# Patient Record
Sex: Male | Born: 1935 | Race: White | Hispanic: No | Smoking: Never smoker
Health system: Southern US, Community
[De-identification: ages and names within clinical notes are randomized; demographics above are authoritative.]

## PROBLEM LIST (undated history)

## (undated) DIAGNOSIS — I5032 Chronic diastolic (congestive) heart failure: Secondary | ICD-10-CM

## (undated) DIAGNOSIS — I482 Chronic atrial fibrillation, unspecified: Secondary | ICD-10-CM

## (undated) DIAGNOSIS — Z96659 Presence of unspecified artificial knee joint: Secondary | ICD-10-CM

## (undated) DIAGNOSIS — R0600 Dyspnea, unspecified: Secondary | ICD-10-CM

## (undated) DIAGNOSIS — I340 Nonrheumatic mitral (valve) insufficiency: Secondary | ICD-10-CM

## (undated) DIAGNOSIS — T8459XS Infection and inflammatory reaction due to other internal joint prosthesis, sequela: Secondary | ICD-10-CM

## (undated) DIAGNOSIS — I480 Paroxysmal atrial fibrillation: Secondary | ICD-10-CM

## (undated) DIAGNOSIS — I350 Nonrheumatic aortic (valve) stenosis: Secondary | ICD-10-CM

## (undated) HISTORY — PX: KNEE SURGERY: SHX244

## (undated) HISTORY — PX: VASCULAR SURGERY: SHX849

---

## 2009-10-05 DIAGNOSIS — E785 Hyperlipidemia, unspecified: Secondary | ICD-10-CM | POA: Insufficient documentation

## 2009-10-05 DIAGNOSIS — M1990 Unspecified osteoarthritis, unspecified site: Secondary | ICD-10-CM | POA: Insufficient documentation

## 2009-10-06 DIAGNOSIS — E039 Hypothyroidism, unspecified: Secondary | ICD-10-CM | POA: Insufficient documentation

## 2010-03-08 DIAGNOSIS — F528 Other sexual dysfunction not due to a substance or known physiological condition: Secondary | ICD-10-CM | POA: Insufficient documentation

## 2011-09-18 DIAGNOSIS — Z1211 Encounter for screening for malignant neoplasm of colon: Secondary | ICD-10-CM | POA: Insufficient documentation

## 2011-09-18 DIAGNOSIS — K644 Residual hemorrhoidal skin tags: Secondary | ICD-10-CM | POA: Insufficient documentation

## 2012-09-30 ENCOUNTER — Encounter (INDEPENDENT_AMBULATORY_CARE_PROVIDER_SITE_OTHER): Payer: Self-pay | Admitting: Ophthalmology

## 2012-10-11 ENCOUNTER — Encounter (INDEPENDENT_AMBULATORY_CARE_PROVIDER_SITE_OTHER): Payer: Medicare Other | Admitting: Ophthalmology

## 2012-10-11 DIAGNOSIS — H3530 Unspecified macular degeneration: Secondary | ICD-10-CM

## 2012-10-11 DIAGNOSIS — H35379 Puckering of macula, unspecified eye: Secondary | ICD-10-CM

## 2012-10-11 DIAGNOSIS — H43819 Vitreous degeneration, unspecified eye: Secondary | ICD-10-CM

## 2012-10-11 DIAGNOSIS — H2510 Age-related nuclear cataract, unspecified eye: Secondary | ICD-10-CM

## 2013-10-13 ENCOUNTER — Ambulatory Visit (INDEPENDENT_AMBULATORY_CARE_PROVIDER_SITE_OTHER): Payer: Medicare Other | Admitting: Ophthalmology

## 2014-01-18 DIAGNOSIS — E669 Obesity, unspecified: Secondary | ICD-10-CM | POA: Insufficient documentation

## 2016-02-04 ENCOUNTER — Emergency Department (HOSPITAL_BASED_OUTPATIENT_CLINIC_OR_DEPARTMENT_OTHER)
Admission: EM | Admit: 2016-02-04 | Discharge: 2016-02-04 | Disposition: A | Discharge status: Home / Self Care | Payer: Medicare Other | Attending: Emergency Medicine | Admitting: Emergency Medicine

## 2016-02-04 ENCOUNTER — Emergency Department (HOSPITAL_BASED_OUTPATIENT_CLINIC_OR_DEPARTMENT_OTHER): Payer: Medicare Other

## 2016-02-04 ENCOUNTER — Encounter (HOSPITAL_BASED_OUTPATIENT_CLINIC_OR_DEPARTMENT_OTHER): Payer: Self-pay | Admitting: *Deleted

## 2016-02-04 DIAGNOSIS — Y998 Other external cause status: Secondary | ICD-10-CM | POA: Insufficient documentation

## 2016-02-04 DIAGNOSIS — S42301A Unspecified fracture of shaft of humerus, right arm, initial encounter for closed fracture: Secondary | ICD-10-CM

## 2016-02-04 DIAGNOSIS — S40021A Contusion of right upper arm, initial encounter: Secondary | ICD-10-CM | POA: Insufficient documentation

## 2016-02-04 DIAGNOSIS — S42291A Other displaced fracture of upper end of right humerus, initial encounter for closed fracture: Secondary | ICD-10-CM | POA: Diagnosis not present

## 2016-02-04 DIAGNOSIS — Y9389 Activity, other specified: Secondary | ICD-10-CM | POA: Diagnosis not present

## 2016-02-04 DIAGNOSIS — Y9289 Other specified places as the place of occurrence of the external cause: Secondary | ICD-10-CM | POA: Insufficient documentation

## 2016-02-04 DIAGNOSIS — W101XXA Fall (on)(from) sidewalk curb, initial encounter: Secondary | ICD-10-CM | POA: Insufficient documentation

## 2016-02-04 DIAGNOSIS — S4991XA Unspecified injury of right shoulder and upper arm, initial encounter: Secondary | ICD-10-CM | POA: Diagnosis present

## 2016-02-04 MED ORDER — OXYCODONE-ACETAMINOPHEN 5-325 MG PO TABS: 1.0000 | ORAL_TABLET | ORAL | Status: DC | PRN

## 2016-02-04 NOTE — ED Notes (Signed)
He fell 8" off a curb yesterday and fell onto concrete. Injury to his right surgery. He had an xray that showed a fracture. He was sent home with a shoulder immobilizer and ortho referral. He wants a second opinion.

## 2016-02-04 NOTE — ED Provider Notes (Signed)
CSN: [id number]     Arrival date & time 02/04/16  1902 History  By signing my name below, I, Louis Koch, attest that this documentation has been prepared under the direction and in the presence of Marily MemosJason Violia Knopf, MD. Electronically Signed: Doreatha MartinEva Koch, ED Scribe. 02/04/2016. 8:27 PM.    Chief Complaint  Patient presents with  . Shoulder Injury   The history is provided by the patient. No language interpreter was used.   HPI Comments: Louis CaterJames Olmsted is a 82 y.o. male who presents to the Emergency Department complaining of moderate right shoulder pain and swelling s/p mechanical fall yesterday. Pt states that he stepped off an 8" curb and fell forward, landing on his right shoulder. Denies LOC or head injury. [facility] following his initial injury, had XR showing fracture and was discharged with a shoulder immobilizer and referral to ortho. Per friend, they were unable to schedule an appointment with ortho today d/t miscommunication between the office and the hospital. Denies numbness, additional injuries.   History reviewed. No pertinent past medical history. Past Surgical History  Procedure Laterality Date  . Knee surgery     No family history on file. Social History  Substance Use Topics  . Smoking status: Never Smoker   . Smokeless tobacco: None  . Alcohol Use: Yes    Review of Systems  Musculoskeletal: Positive for joint swelling and arthralgias.  Neurological: Negative for numbness.  All other systems reviewed and are negative.  Allergies  Review of patient's allergies indicates no known allergies.  Home Medications   Prior to Admission medications   Not on File   BP 132/81 mmHg  Pulse 77  Temp(Src) 98.2 F (36.8 C) (Oral)  Resp 18  Ht 6\' 5"  (1.956 m)  Wt 290 lb (131.543 kg)  BMI 34.38 kg/m2  SpO2 98% Physical Exam  Constitutional: He is oriented to person, place, and time. He appears well-developed and well-nourished.  HENT:  Head:  Normocephalic and atraumatic.  Eyes: Conjunctivae and EOM are normal. Pupils are equal, round, and reactive to light.  Neck: Normal range of motion. Neck supple.  Cardiovascular: Normal rate.   Pulmonary/Chest: Effort normal. No respiratory distress.  Abdominal: He exhibits no distension.  Musculoskeletal: Normal range of motion. He exhibits edema and tenderness.  Large amount of ecchymosis on right upper arm. Minimal swelling. Small amount of induration. Radial pulse 2+.   Neurological: He is alert and oriented to person, place, and time.  Strength and sensation intact to distal right hand. Normal grip strength.   Skin: Skin is warm and dry.  Psychiatric: He has a normal mood and affect. His behavior is normal.  Nursing note and vitals reviewed.   ED Course  Procedures (including critical care time) DIAGNOSTIC STUDIES: Oxygen Saturation is 98% on RA, normal by my interpretation.    COORDINATION OF CARE: 8:21 PM Discussed treatment plan with pt at bedside which includes repeat XR, ortho referral and pt agreed to plan.  Imaging Review No results found. I have personally reviewed and evaluated these images as part of my medical decision-making.   MDM   Final diagnoses:  None    Here for 2nd opinion of broken humerus 2/2 communication issues. XR shows that shoulder is still broken. Sling already applied. NVI. Some swelling but no e/o compartment syndrome. Will need close ortho follow up for consideration of surgery, on call orthopedic referral given.   New Prescriptions: Discharge Medication List as of 02/04/2016  9:22  PM    START taking these medications   Details  oxyCODONE-acetaminophen (PERCOCET) 5-325 MG tablet Take 1-2 tablets by mouth every 4 (four) hours as needed., Starting 02/04/2016, Until Discontinued, Print        I have personally and contemperaneously reviewed labs and imaging and used in my decision making as above.   A medical screening exam was performed and  I feel the patient has had an appropriate workup for their chief complaint at this time and likelihood of emergent condition existing is low. Their vital signs are stable. They have been counseled on decision, discharge, follow up and which symptoms necessitate immediate return to the emergency department.  They verbally stated understanding and agreement with plan and discharged in stable condition.   I personally performed the services described in this documentation, which was scribed in my presence. The recorded information has been reviewed and is accurate.    Marily Memos, MD 02/04/16 [phone]

## 2021-11-01 ENCOUNTER — Inpatient Hospital Stay (HOSPITAL_COMMUNITY)
Admission: EM | Admit: 2021-11-01 | Discharge: 2021-11-07 | DRG: 286 | Disposition: A | Discharge status: Home / Self Care | Payer: Medicare Other | Attending: Internal Medicine | Admitting: Internal Medicine

## 2021-11-01 ENCOUNTER — Encounter (HOSPITAL_COMMUNITY): Payer: Self-pay

## 2021-11-01 ENCOUNTER — Other Ambulatory Visit: Payer: Self-pay

## 2021-11-01 ENCOUNTER — Emergency Department (HOSPITAL_COMMUNITY): Payer: Medicare Other

## 2021-11-01 DIAGNOSIS — J9601 Acute respiratory failure with hypoxia: Secondary | ICD-10-CM | POA: Diagnosis present

## 2021-11-01 DIAGNOSIS — J9602 Acute respiratory failure with hypercapnia: Secondary | ICD-10-CM | POA: Diagnosis present

## 2021-11-01 DIAGNOSIS — I493 Ventricular premature depolarization: Secondary | ICD-10-CM | POA: Diagnosis present

## 2021-11-01 DIAGNOSIS — F410 Panic disorder [episodic paroxysmal anxiety] without agoraphobia: Secondary | ICD-10-CM | POA: Diagnosis present

## 2021-11-01 DIAGNOSIS — I110 Hypertensive heart disease with heart failure: Secondary | ICD-10-CM | POA: Diagnosis present

## 2021-11-01 DIAGNOSIS — E861 Hypovolemia: Secondary | ICD-10-CM | POA: Diagnosis present

## 2021-11-01 DIAGNOSIS — I2510 Atherosclerotic heart disease of native coronary artery without angina pectoris: Secondary | ICD-10-CM | POA: Diagnosis present

## 2021-11-01 DIAGNOSIS — E871 Hypo-osmolality and hyponatremia: Secondary | ICD-10-CM | POA: Diagnosis present

## 2021-11-01 DIAGNOSIS — Z79899 Other long term (current) drug therapy: Secondary | ICD-10-CM

## 2021-11-01 DIAGNOSIS — I5031 Acute diastolic (congestive) heart failure: Secondary | ICD-10-CM | POA: Diagnosis not present

## 2021-11-01 DIAGNOSIS — R0603 Acute respiratory distress: Secondary | ICD-10-CM

## 2021-11-01 DIAGNOSIS — G9341 Metabolic encephalopathy: Secondary | ICD-10-CM | POA: Diagnosis present

## 2021-11-01 DIAGNOSIS — I350 Nonrheumatic aortic (valve) stenosis: Secondary | ICD-10-CM | POA: Diagnosis not present

## 2021-11-01 DIAGNOSIS — I509 Heart failure, unspecified: Secondary | ICD-10-CM | POA: Diagnosis present

## 2021-11-01 DIAGNOSIS — I480 Paroxysmal atrial fibrillation: Secondary | ICD-10-CM | POA: Diagnosis present

## 2021-11-01 DIAGNOSIS — G4730 Sleep apnea, unspecified: Secondary | ICD-10-CM | POA: Diagnosis present

## 2021-11-01 DIAGNOSIS — I5033 Acute on chronic diastolic (congestive) heart failure: Secondary | ICD-10-CM | POA: Diagnosis not present

## 2021-11-01 DIAGNOSIS — I083 Combined rheumatic disorders of mitral, aortic and tricuspid valves: Secondary | ICD-10-CM | POA: Diagnosis present

## 2021-11-01 DIAGNOSIS — Z20822 Contact with and (suspected) exposure to covid-19: Secondary | ICD-10-CM | POA: Diagnosis present

## 2021-11-01 DIAGNOSIS — I4821 Permanent atrial fibrillation: Secondary | ICD-10-CM | POA: Diagnosis not present

## 2021-11-01 DIAGNOSIS — I4891 Unspecified atrial fibrillation: Secondary | ICD-10-CM | POA: Diagnosis present

## 2021-11-01 HISTORY — DX: Presence of unspecified artificial knee joint: T84.59XS

## 2021-11-01 HISTORY — DX: Paroxysmal atrial fibrillation: I48.0

## 2021-11-01 HISTORY — DX: Nonrheumatic mitral (valve) insufficiency: I34.0

## 2021-11-01 HISTORY — DX: Presence of unspecified artificial knee joint: Z96.659

## 2021-11-01 HISTORY — DX: Nonrheumatic aortic (valve) stenosis: I35.0

## 2021-11-01 HISTORY — DX: Chronic diastolic (congestive) heart failure: I50.32

## 2021-11-01 LAB — COMPREHENSIVE METABOLIC PANEL
ALT: 28 U/L (ref 0–44)
AST: 32 U/L (ref 15–41)
Albumin: 3.7 g/dL (ref 3.5–5.0)
Alkaline Phosphatase: 104 U/L (ref 38–126)
Anion gap: 9 (ref 5–15)
BUN: 11 mg/dL (ref 8–23)
CO2: 27 mmol/L (ref 22–32)
Calcium: 8.7 mg/dL — ABNORMAL LOW (ref 8.9–10.3)
Chloride: 97 mmol/L — ABNORMAL LOW (ref 98–111)
Creatinine, Ser: 0.88 mg/dL (ref 0.61–1.24)
GFR, Estimated: >60 mL/min (ref >60)
Glucose, Bld: 130 mg/dL — ABNORMAL HIGH (ref 70–99)
Potassium: 3.5 mmol/L (ref 3.5–5.1)
Sodium: 133 mmol/L — ABNORMAL LOW (ref 135–145)
Total Bilirubin: 2.4 mg/dL — ABNORMAL HIGH (ref 0.3–1.2)
Total Protein: 6.8 g/dL (ref 6.5–8.1)

## 2021-11-01 LAB — I-STAT VENOUS BLOOD GAS, ED
Acid-base deficit: 1 mmol/L (ref 0.0–2.0)
Bicarbonate: 25.9 mmol/L (ref 20.0–28.0)
Calcium, Ion: 1.18 mmol/L (ref 1.15–1.40)
HCT: 42 % (ref 39.0–52.0)
Hemoglobin: 14.3 g/dL (ref 13.0–17.0)
O2 Saturation: 82 %
Potassium: 3.7 mmol/L (ref 3.5–5.1)
Sodium: 135 mmol/L (ref 135–145)
TCO2: 27 mmol/L (ref 22–32)
pCO2, Ven: 50.1 mmHg (ref 44.0–60.0)
pH, Ven: 7.322 (ref 7.250–7.430)
pO2, Ven: 51 mmHg — ABNORMAL HIGH (ref 32.0–45.0)

## 2021-11-01 LAB — BLOOD GAS, VENOUS
Acid-Base Excess: 2.4 mmol/L — ABNORMAL HIGH (ref 0.0–2.0)
Bicarbonate: 28.1 mmol/L — ABNORMAL HIGH (ref 20.0–28.0)
O2 Saturation: 38 %
Patient temperature: 37
pCO2, Ven: 57.4 mmHg (ref 44.0–60.0)
pH, Ven: 7.311 (ref 7.250–7.430)
pO2, Ven: <31 mmHg — CL (ref 32.0–45.0)

## 2021-11-01 LAB — CBC
HCT: 41 % (ref 39.0–52.0)
Hemoglobin: 13.5 g/dL (ref 13.0–17.0)
MCH: 31.5 pg (ref 26.0–34.0)
MCHC: 32.9 g/dL (ref 30.0–36.0)
MCV: 95.6 fL (ref 80.0–100.0)
Platelets: 212 10*3/uL (ref 150–400)
RBC: 4.29 MIL/uL (ref 4.22–5.81)
RDW: 14.1 % (ref 11.5–15.5)
WBC: 6 10*3/uL (ref 4.0–10.5)
nRBC: 0 % (ref 0.0–0.2)

## 2021-11-01 LAB — RESP PANEL BY RT-PCR (FLU A&B, COVID) ARPGX2
Influenza A by PCR: NEGATIVE
Influenza B by PCR: NEGATIVE
SARS Coronavirus 2 by RT PCR: NEGATIVE

## 2021-11-01 LAB — TROPONIN I (HIGH SENSITIVITY)
Troponin I (High Sensitivity): 23 ng/L — ABNORMAL HIGH (ref <18)
Troponin I (High Sensitivity): 25 ng/L — ABNORMAL HIGH (ref <18)

## 2021-11-01 LAB — PROCALCITONIN: Procalcitonin: <0.1 ng/mL

## 2021-11-01 LAB — BRAIN NATRIURETIC PEPTIDE: B Natriuretic Peptide: 264 pg/mL — ABNORMAL HIGH (ref 0.0–100.0)

## 2021-11-01 MED ORDER — FUROSEMIDE 10 MG/ML IJ SOLN
20.0000 mg | Freq: Once | INTRAMUSCULAR | Status: AC
  Administered 2021-11-01: 20 mg via INTRAVENOUS
  Filled 2021-11-01: qty 2

## 2021-11-01 MED ORDER — IPRATROPIUM-ALBUTEROL 0.5-2.5 (3) MG/3ML IN SOLN
3.0000 mL | Freq: Four times a day (QID) | RESPIRATORY_TRACT | Status: DC
  Administered 2021-11-01 (×2): 3 mL via RESPIRATORY_TRACT
  Filled 2021-11-01 (×2): qty 3

## 2021-11-01 MED ORDER — ONDANSETRON HCL 4 MG/2ML IJ SOLN: 4.0000 mg | Freq: Four times a day (QID) | INTRAMUSCULAR | Status: DC | PRN

## 2021-11-01 MED ORDER — METOPROLOL TARTRATE 12.5 MG HALF TABLET
12.5000 mg | ORAL_TABLET | Freq: Two times a day (BID) | ORAL | Status: DC
  Administered 2021-11-01 – 2021-11-07 (×13): 12.5 mg via ORAL
  Filled 2021-11-01 (×13): qty 1

## 2021-11-01 MED ORDER — SODIUM CHLORIDE 0.9 % IV SOLN
1.0000 g | Freq: Once | INTRAVENOUS | Status: AC
  Administered 2021-11-01: 1 g via INTRAVENOUS
  Filled 2021-11-01: qty 10

## 2021-11-01 MED ORDER — HEPARIN (PORCINE) 25000 UT/250ML-% IV SOLN
1500.0000 [IU]/h | INTRAVENOUS | Status: DC
  Administered 2021-11-01 – 2021-11-02 (×2): 1600 [IU]/h via INTRAVENOUS
  Administered 2021-11-02 – 2021-11-04 (×3): 1500 [IU]/h via INTRAVENOUS
  Filled 2021-11-01 (×5): qty 250

## 2021-11-01 MED ORDER — SODIUM CHLORIDE 0.9 % IV SOLN: 250.0000 mL | INTRAVENOUS | Status: DC | PRN

## 2021-11-01 MED ORDER — DIGOXIN 0.25 MG/ML IJ SOLN
0.1250 mg | Freq: Four times a day (QID) | INTRAMUSCULAR | Status: AC
  Administered 2021-11-01 – 2021-11-02 (×3): 0.125 mg via INTRAVENOUS
  Filled 2021-11-01 (×3): qty 2

## 2021-11-01 MED ORDER — GUAIFENESIN ER 600 MG PO TB12
1200.0000 mg | ORAL_TABLET | Freq: Two times a day (BID) | ORAL | Status: DC
  Administered 2021-11-01 – 2021-11-07 (×13): 1200 mg via ORAL
  Filled 2021-11-01 (×13): qty 2

## 2021-11-01 MED ORDER — SODIUM CHLORIDE 0.9 % IV SOLN
500.0000 mg | INTRAVENOUS | Status: DC
  Administered 2021-11-01: 500 mg via INTRAVENOUS
  Filled 2021-11-01 (×2): qty 5

## 2021-11-01 MED ORDER — HEPARIN BOLUS VIA INFUSION
5800.0000 [IU] | Freq: Once | INTRAVENOUS | Status: AC
  Administered 2021-11-01: 5800 [IU] via INTRAVENOUS
  Filled 2021-11-01: qty 5800

## 2021-11-01 MED ORDER — SODIUM CHLORIDE 0.9% FLUSH: 3.0000 mL | INTRAVENOUS | Status: DC | PRN

## 2021-11-01 MED ORDER — SODIUM CHLORIDE 0.9% FLUSH
3.0000 mL | Freq: Two times a day (BID) | INTRAVENOUS | Status: DC
  Administered 2021-11-02 – 2021-11-04 (×5): 3 mL via INTRAVENOUS

## 2021-11-01 MED ORDER — ACETAMINOPHEN 325 MG PO TABS: 650.0000 mg | ORAL_TABLET | ORAL | Status: DC | PRN

## 2021-11-01 NOTE — Plan of Care (Signed)

## 2021-11-01 NOTE — H&P (Signed)
History and Physical    Louis Koch [id number] DOB: [birth year] DOA: 11/01/2021  PCP: Pcp, No (Confirm with patient/family/NH records and if not entered, this has to be entered at [facility] point of entry) Patient coming from: Home  I have personally briefly reviewed patient's old medical records in [place]  Chief Complaint: SOB  HPI: Louis Koch is a 88 y.o. male with medical history significant of chronic diastolic CHF, moderate aortic stenosis, moderate mitral regurgitation ([facility] record 2015), bilateral knee OA status post replacement, presented with increasing shortness of breath.  Patient and family confirmed that patient has not been seeing any medical doctor for last 5+ years.  Right now, patient very lethargic and confused, only able to answer some of the questions.  Patient reported he has not been able to sleep for the last 3 nights because of increasing shortness of breath.  No cough no fever chills no chest pains.  Son, Louis Koch reported that the patient complained about increasing shortness of breath exertional in the recent weeks, this morning, he and his brother arrived at home and found patient wheezing and confused and called ambulance.  EMS arrived found patient O2 saturation 70s and wheezing was given breathing treatment and nonrebreather, on route also given albuterol Atrovent Solu-Medrol magnesium.  ED Course: O2 saturation 90% on room air on arrival, stabilized on 3 L.  Confused, in respiratory distress, chest x-ray showed cardiomegaly and interstitial congestion bilaterally suspicious for pulmonary congestion versus versus pneumonia.  Patient was given ceftriaxone and azithromycin.  Review of Systems unable to complete from patient confused.  History reviewed. No pertinent past medical history.  Past Surgical History:  Procedure Laterality Date   KNEE SURGERY       reports that he has never smoked. He does not have any smokeless tobacco history on file. He  reports current alcohol use. He reports that he does not use drugs.  No Known Allergies  No family history on file.   Prior to Admission medications   Medication Sig Start Date End Date Taking? Authorizing Provider  oxyCODONE-acetaminophen (PERCOCET) 5-325 MG tablet Take 1-2 tablets by mouth every 4 (four) hours as needed. 02/04/16   Mesner, Corene Cornea, MD    Physical Exam: Vitals:   11/01/21 1245 11/01/21 1300 11/01/21 1315 11/01/21 1345  BP: (!) 129/94 (!) 146/99 (!) 146/101 (!) 139/91  Pulse: (!) 52 96 99 91  Resp: (!) 22 (!) 29 (!) 22 (!) 23  Temp:      TempSrc:      SpO2: 99% 98% 97% 97%  Weight:      Height:        Constitutional: NAD, calm, comfortable Vitals:   11/01/21 1245 11/01/21 1300 11/01/21 1315 11/01/21 1345  BP: (!) 129/94 (!) 146/99 (!) 146/101 (!) 139/91  Pulse: (!) 52 96 99 91  Resp: (!) 22 (!) 29 (!) 22 (!) 23  Temp:      TempSrc:      SpO2: 99% 98% 97% 97%  Weight:      Height:       Eyes: PERRL, lids and conjunctivae normal ENMT: Mucous membranes are moist. Posterior pharynx clear of any exudate or lesions.Normal dentition.  Neck: normal, supple, no masses, no thyromegaly Respiratory: Diminished breathing sound bilaterally, scattered wheezing, fine crackles.  Increasing respiratory effort, talking in broken sentences.  No accessory muscle use.  Cardiovascular: Regular rate and rhythm, no murmurs / rubs / gallops. 2+extremity edema. 2+ pedal pulses. No carotid bruits.  Abdomen: no  tenderness, no masses palpated. No hepatosplenomegaly. Bowel sounds positive.  Musculoskeletal: no clubbing / cyanosis. No joint deformity upper and lower extremities. Good ROM, no contractures. Normal muscle tone.  Skin: no rashes, lesions, ulcers. No induration Neurologic: No facial droops, moving all limbs, following simple command. Psychiatric: Sleepy, arousable, confused, oriented to himself.    Labs on Admission: I have personally reviewed following labs and imaging  studies  CBC: Recent Labs  Lab 11/01/21 1155  WBC 6.0  HGB 13.5  HCT 41.0  MCV 95.6  PLT 99991111   Basic Metabolic Panel: Recent Labs  Lab 11/01/21 1155  NA 133*  K 3.5  CL 97*  CO2 27  GLUCOSE 130*  BUN 11  CREATININE 0.88  CALCIUM 8.7*   GFR: Estimated Creatinine Clearance: 79.7 mL/min (by C-G formula based on SCr of 0.88 mg/dL). Liver Function Tests: Recent Labs  Lab 11/01/21 1155  AST 32  ALT 28  ALKPHOS 104  BILITOT 2.4*  PROT 6.8  ALBUMIN 3.7   No results for input(s): LIPASE, AMYLASE in the last 168 hours. No results for input(s): AMMONIA in the last 168 hours. Coagulation Profile: No results for input(s): INR, PROTIME in the last 168 hours. Cardiac Enzymes: No results for input(s): CKTOTAL, CKMB, CKMBINDEX, TROPONINI in the last 168 hours. BNP (last 3 results) No results for input(s): PROBNP in the last 8760 hours. HbA1C: No results for input(s): HGBA1C in the last 72 hours. CBG: No results for input(s): GLUCAP in the last 168 hours. Lipid Profile: No results for input(s): CHOL, HDL, LDLCALC, TRIG, CHOLHDL, LDLDIRECT in the last 72 hours. Thyroid Function Tests: No results for input(s): TSH, T4TOTAL, FREET4, T3FREE, THYROIDAB in the last 72 hours. Anemia Panel: No results for input(s): VITAMINB12, FOLATE, FERRITIN, TIBC, IRON, RETICCTPCT in the last 72 hours. Urine analysis: No results found for: COLORURINE, APPEARANCEUR, [place], [place], GLUCOSEU, HGBUR, BILIRUBINUR, KETONESUR, PROTEINUR, UROBILINOGEN, NITRITE, LEUKOCYTESUR  Radiological Exams on Admission: DG Chest Port 1 View  Result Date: 11/01/2021 CLINICAL DATA:  Shortness of breath EXAM: PORTABLE CHEST 1 VIEW COMPARISON:  None. FINDINGS: Transverse diameter of heart is increased. Central pulmonary vessels are prominent. Interstitial markings in the parahilar regions and lower lung fields are prominent. There is no focal pulmonary consolidation. Left lateral CP angle is not included in its  entirety. There is no pneumothorax. Degenerative changes are noted in the right shoulder. IMPRESSION: Cardiomegaly. There is prominence of interstitial markings in the parahilar regions and lower lung fields suggesting mild interstitial edema or interstitial pneumonia. There is no focal pulmonary consolidation. There is no pleural effusion. Electronically Signed   By: Elmer Picker M.D.   On: 11/01/2021 12:04    EKG: Independently reviewed.  A. fib, no acute ST changes  Assessment/Plan Principal Problem:   CHF (congestive heart failure) (HCC) Active Problems:   Acute CHF (congestive heart failure) ([facility])  (please populate well all problems here in Problem List. (For example, if patient is on BP meds at home and you resume or decide to hold them, it is a problem that needs to be her. Same for CAD, COPD, HLD and so on)  Acute hypoxic respite failure -Suspect cardiac asthma, likely secondary to acute on chronic diastolic CHF decompensation, also possibly decompensation of aortic stenosis and or mitral regurgitation which complicated by A. fib with RVR. -Symptoms and signs of fluid overload with mild hyponatremia, 1 dose of 20 mg Lasix ordered. -Other differential, pneumonia less likely given no white count no fever, crackles on bilateral  as well as bilateral interstitial changes on x-ray.  Will not redose antibiotics at this time.  Send procalcitonin level. -Echocardiogram -Cussed with son Louis Koch over the phone, patient does have a living will at home, and Louis Koch does not know about the detail but he will try to find it today and bring it in tomorrow.  Patient is full code for now.  A. fib with RVR -Symptoms signs of heart failure, will rate control with digoxin loading and starting metoprolol 12.5 mg twice daily. -CHADS2=2, will start patient on heparin drip bridging for Eliquis if H&H stable tomorrow.  Metabolic encephalopathy -Secondary to CHF, will check VBG -Consider further image study if  patient not improved after CHF and A. fib treated.  Hyponatremia -Likely from CHF compensation, diuresis, reevaluate.   DVT prophylaxis: Heparin drip Code Status: Full code for now Family Communication: Son over the phone Disposition Plan: Patient is sick with multiple underlying untreated medical problem including aortic stenosis and CHF, came with CHF decompensation, expect more than 2 midnight hospital stay. Consults called: None Admission status: PCU   Lequita Halt MD [facility] Pager [phone]  11/01/2021, 2:07 PM

## 2021-11-01 NOTE — ED Triage Notes (Signed)
BIB EMS and upon arrival patient had sats in 60's and new onset afib.  Patient hypertensive diminished lung sounds.  Received albuterol 10mg , atrovent 0.5mg  Solumedrol 125mg  and mag 2gr with ems.  Patient 90% on RA upon arrival

## 2021-11-01 NOTE — Progress Notes (Addendum)
ANTICOAGULATION CONSULT NOTE - Initial Consult  Pharmacy Consult for IV heparin Indication: atrial fibrillation  No Known Allergies  Patient Measurements: Height: 6\' 5"  (195.6 cm) Weight: 129.3 kg (285 lb) IBW/kg (Calculated) : 89.1 Heparin Dosing Weight: 116.7 kg  Vital Signs: Temp: 96.7 F (35.9 C) (01/03 1106) Temp Source: Axillary (01/03 1106) BP: 139/91 (01/03 1345) Pulse Rate: 91 (01/03 1345)  Labs: Recent Labs    11/01/21 1155  HGB 13.5  HCT 41.0  PLT 212  CREATININE 0.88    Estimated Creatinine Clearance: 79.7 mL/min (by C-G formula based on SCr of 0.88 mg/dL).   Medical History: History reviewed. No pertinent past medical history.  Assessment: 51 YOM with shortness of breath admitted for acute hypoxic respiratory failure. PMH significant for Afib with RVR. No history of anticoagulation prior to admission. Pharmacy to dose IV heparin.  Hemoglobin and platelets are within normal limits. Renal function is okay.   Goal of Therapy:  Heparin level 0.3-0.7 units/ml Monitor platelets by anticoagulation protocol: Yes   Plan:  IV Heparin 5800 units bolus x 1 Start IV heparin gtt at 1600 units/h 8 hour heparin level Daily heparin level, CBC Monitor for signs/symptoms of bleeding Follow-up plans for long-term anticoagulation  Thank you for involving pharmacy in this patient's care.  99, PharmD PGY1 Pharmacy Resident 11/01/2021 2:53 PM  **Pharmacist phone directory can be found on [email or web address] listed under [facility]**

## 2021-11-01 NOTE — ED Provider Notes (Signed)
[facility] Provider Note   CSN: [id number] Arrival date & time: 11/01/21  1102     History  Chief Complaint  Patient presents with   Respiratory Distress    Louis Koch is a 88 y.o. male.  HPI 88 year old male with past medical history significant for no current medications except for aspirin, presents today stating that he has been short of breath for 2 weeks.  He denies any concurrent symptoms, specifically denies rhinorrhea, sore throat, cough, chest pain, abdominal pain, nausea, vomiting, diarrhea.  He states that he has had no similar symptoms in the past.  He was told once many years ago that he had some atrial fibrillation but was not treated in any specific way and he does not believe that this was ongoing.  He reports he has not seen a physician for the past 6 to 7 years she has not needed anything.  He has no local primary care.  Per EMS reports a arrived to find his oxygen saturations in the 60s, new onset A. fib and patient rates [place] 10 mg of albuterol, 0.5 of Atrovent, Solu-Medrol 125 mg, mag 2 g and patient's oxygen saturations 90% on arrival here.  My evaluation he is on nasal cannula.  Oxygen saturations are 92%.    Home Medications Prior to Admission medications   Medication Sig Start Date End Date Taking? Authorizing Provider  aspirin 500 MG EC tablet Take 500 mg by mouth every evening.   Yes [provider]  B Complex Vitamins (B COMPLEX PO) Take 1 tablet by mouth daily.   Yes [provider]  Cholecalciferol (D3 PO) Take 1 capsule by mouth daily.   Yes [provider]  Cyanocobalamin (B-12 PO) Take 1 tablet by mouth daily.   Yes [provider]  Multiple Vitamin (MULTIVITAMIN WITH MINERALS) TABS tablet Take 1 tablet by mouth daily.   Yes [provider]      Allergies    Patient has no known allergies.    Review of Systems   Review of Systems  All other systems reviewed  and are negative.  Physical Exam Updated Vital Signs BP (!) 138/106    Pulse (!) 48    Temp (!) 96.7 F (35.9 C) (Axillary)    Resp (!) 22    Ht 1.956 m (6\' 5" )    Wt 129.3 kg    SpO2 97%    BMI 33.80 kg/m  Physical Exam Vitals and nursing note reviewed.  Constitutional:      General: He is not in acute distress.    Appearance: Normal appearance. He is obese.  HENT:     Head: Normocephalic.     Right Ear: External ear normal.     Left Ear: External ear normal.     Nose: Nose normal.     Mouth/Throat:     Pharynx: Oropharynx is clear.  Eyes:     Pupils: Pupils are equal, round, and reactive to light.  Cardiovascular:     Rate and Rhythm: Normal rate. Rhythm irregular.     Pulses: Normal pulses.  Pulmonary:     Effort: Pulmonary effort is normal.     Breath sounds: Normal breath sounds.     Comments: Breath sounds decreased bilateral bases no wheezing noted Abdominal:     General: Abdomen is flat.  Musculoskeletal:        General: Normal range of motion.     Cervical back: Normal range of motion.  Skin:    General: Skin is warm and dry.     Capillary Refill: Capillary refill takes less than 2 seconds.  Neurological:     General: No focal deficit present.     Mental Status: He is alert.  Psychiatric:        Mood and Affect: Mood normal.    ED Results / Procedures / Treatments   Labs (all labs ordered are listed, but only abnormal results are displayed) Labs Reviewed  COMPREHENSIVE METABOLIC PANEL - Abnormal; Notable for the following components:      Result Value   Sodium 133 (*)    Chloride 97 (*)    Glucose, Bld 130 (*)    Calcium 8.7 (*)    Total Bilirubin 2.4 (*)    All other components within normal limits  BRAIN NATRIURETIC PEPTIDE - Abnormal; Notable for the following components:   B Natriuretic Peptide 264.0 (*)    All other components within normal limits  I-STAT VENOUS BLOOD GAS, ED - Abnormal; Notable for the following components:   pO2, Ven 51.0 (*)     All other components within normal limits  TROPONIN I (HIGH SENSITIVITY) - Abnormal; Notable for the following components:   Troponin I (High Sensitivity) 23 (*)    All other components within normal limits  RESP PANEL BY RT-PCR (FLU A&B, COVID) ARPGX2  CBC  PROCALCITONIN  BLOOD GAS, VENOUS  HEPARIN LEVEL (UNFRACTIONATED)  TROPONIN I (HIGH SENSITIVITY)    EKG EKG Interpretation  Date/Time:  Tuesday November 01 2021 11:04:20 EST Ventricular Rate:  111 PR Interval:    QRS Duration: 96 QT Interval:  361 QTC Calculation: 491 R Axis:   70 Text Interpretation: Atrial fibrillation Borderline T abnormalities, inferior leads Borderline prolonged QT interval Confirmed by Pattricia Boss [phone]) on 11/01/2021 11:34:32 AM  Radiology DG Chest Port 1 View  Result Date: 11/01/2021 CLINICAL DATA:  Shortness of breath EXAM: PORTABLE CHEST 1 VIEW COMPARISON:  None. FINDINGS: Transverse diameter of heart is increased. Central pulmonary vessels are prominent. Interstitial markings in the parahilar regions and lower lung fields are prominent. There is no focal pulmonary consolidation. Left lateral CP angle is not included in its entirety. There is no pneumothorax. Degenerative changes are noted in the right shoulder. IMPRESSION: Cardiomegaly. There is prominence of interstitial markings in the parahilar regions and lower lung fields suggesting mild interstitial edema or interstitial pneumonia. There is no focal pulmonary consolidation. There is no pleural effusion. Electronically Signed   By: Elmer Picker M.D.   On: 11/01/2021 12:04    Procedures .Critical Care Performed by: Pattricia Boss, MD Authorized by: Pattricia Boss, MD   Critical care provider statement:    Critical care time (minutes):  30   Critical care was necessary to treat or prevent imminent or life-threatening deterioration of the following conditions:  Respiratory failure   Critical care was time spent personally by me on the  following activities:  Development of treatment plan with patient or surrogate, discussions with consultants, evaluation of patient's response to treatment, examination of patient, ordering and review of laboratory studies, ordering and review of radiographic studies, ordering and performing treatments and interventions, pulse oximetry, re-evaluation of patient's condition and review of old charts    Medications Ordered in ED Medications  azithromycin (ZITHROMAX) 500 mg in sodium chloride 0.9 % 250 mL IVPB (500 mg Intravenous New Bag/Given 11/01/21 1425)  digoxin (LANOXIN) 0.25 MG/ML injection 0.125 mg (0.125 mg Intravenous Given 11/01/21 1424)  metoprolol tartrate (LOPRESSOR)  tablet 12.5 mg (12.5 mg Oral Given 11/01/21 1424)  sodium chloride flush (NS) 0.9 % injection 3 mL (3 mLs Intravenous Not Given 11/01/21 1425)  sodium chloride flush (NS) 0.9 % injection 3 mL (has no administration in time range)  0.9 %  sodium chloride infusion (has no administration in time range)  acetaminophen (TYLENOL) tablet 650 mg (has no administration in time range)  ondansetron (ZOFRAN) injection 4 mg (has no administration in time range)  ipratropium-albuterol (DUONEB) 0.5-2.5 (3) MG/3ML nebulizer solution 3 mL (3 mLs Nebulization Given 11/01/21 1424)  guaiFENesin (MUCINEX) 12 hr tablet 1,200 mg (1,200 mg Oral Given 11/01/21 1424)  heparin ADULT infusion 100 units/mL (25000 units/245mL) (1,600 Units/hr Intravenous New Bag/Given 11/01/21 1506)  cefTRIAXone (ROCEPHIN) 1 g in sodium chloride 0.9 % 100 mL IVPB (0 g Intravenous Stopped 11/01/21 1400)  furosemide (LASIX) injection 20 mg (20 mg Intravenous Given 11/01/21 1424)  heparin bolus via infusion 5,800 Units (5,800 Units Intravenous Bolus from Bag 11/01/21 1507)    ED Course/ Medical Decision Making/ A&P                           Medical Decision Making Amount and/or Complexity of Data Reviewed Independent Historian:     Details: son External Data Reviewed: labs. Labs:  ordered. Decision-making details documented in ED Course. Radiology: ordered and independent interpretation performed. Decision-making details documented in ED Course. ECG/medicine tests: ordered and independent interpretation performed. Decision-making details documented in ED Course. Discussion of management or test interpretation with external provider(s): Patient presents today with new onset dyspnea.  Patient was wheezing prehospital by my exam is not wheezing.  He received multiple medications including Solu-Medrol and albuterol and magnesium prior to my evaluation. Review of his imaging, EKG, and labs reveals multiple possible sources of dyspnea including CHF, COPD exacerbation, asthma, and new onset A. fib.  Patient is treated here in the ED with ceftriaxone and Zithromax.  His A. fib is rate controlled.  It is unclear how long he has been in atrial fibrillation as he has not had any medical care for several years. With his dyspnea and new oxygen requirement, will admit for further evaluation and treatment.    Final Clinical Impression(s) / ED Diagnoses Final diagnoses:  Respiratory distress    Rx / DC Orders ED Discharge Orders     None         Pattricia Boss, MD 11/01/21 1558

## 2021-11-02 ENCOUNTER — Inpatient Hospital Stay (HOSPITAL_COMMUNITY): Payer: Medicare Other

## 2021-11-02 DIAGNOSIS — I5031 Acute diastolic (congestive) heart failure: Secondary | ICD-10-CM | POA: Diagnosis not present

## 2021-11-02 LAB — ECHOCARDIOGRAM COMPLETE
AR max vel: 0.93 cm2
AV Area VTI: 0.83 cm2
AV Area mean vel: 0.83 cm2
AV Mean grad: 22 mmHg
AV Peak grad: 32.8 mmHg
Ao pk vel: 2.86 m/s
Area-P 1/2: 3.77 cm2
Calc EF: 56.2 %
Height: 77 in
MV M vel: 5.31 m/s
MV Peak grad: 112.8 mmHg
P 1/2 time: 500 msec
S' Lateral: 3.1 cm
Single Plane A2C EF: 57 %
Single Plane A4C EF: 56.5 %
Weight: 4747.2 oz

## 2021-11-02 LAB — PROCALCITONIN: Procalcitonin: <0.1 ng/mL

## 2021-11-02 LAB — CBC
HCT: 37.6 % — ABNORMAL LOW (ref 39.0–52.0)
Hemoglobin: 12.5 g/dL — ABNORMAL LOW (ref 13.0–17.0)
MCH: 31.3 pg (ref 26.0–34.0)
MCHC: 33.2 g/dL (ref 30.0–36.0)
MCV: 94 fL (ref 80.0–100.0)
Platelets: 218 K/uL (ref 150–400)
RBC: 4 MIL/uL — ABNORMAL LOW (ref 4.22–5.81)
RDW: 13.8 % (ref 11.5–15.5)
WBC: 4.9 K/uL (ref 4.0–10.5)
nRBC: 0 % (ref 0.0–0.2)

## 2021-11-02 LAB — BASIC METABOLIC PANEL WITH GFR
Anion gap: 7 (ref 5–15)
BUN: 14 mg/dL (ref 8–23)
CO2: 26 mmol/L (ref 22–32)
Calcium: 8.7 mg/dL — ABNORMAL LOW (ref 8.9–10.3)
Chloride: 99 mmol/L (ref 98–111)
Creatinine, Ser: 0.76 mg/dL (ref 0.61–1.24)
GFR, Estimated: >60 mL/min
Glucose, Bld: 134 mg/dL — ABNORMAL HIGH (ref 70–99)
Potassium: 4.2 mmol/L (ref 3.5–5.1)
Sodium: 132 mmol/L — ABNORMAL LOW (ref 135–145)

## 2021-11-02 LAB — HEPARIN LEVEL (UNFRACTIONATED)
Heparin Unfractionated: 0.57 IU/mL (ref 0.30–0.70)
Heparin Unfractionated: 0.76 IU/mL — ABNORMAL HIGH (ref 0.30–0.70)

## 2021-11-02 MED ORDER — BISACODYL 5 MG PO TBEC
10.0000 mg | DELAYED_RELEASE_TABLET | Freq: Once | ORAL | Status: AC
  Administered 2021-11-02: 10 mg via ORAL
  Filled 2021-11-02: qty 2

## 2021-11-02 MED ORDER — PERFLUTREN LIPID MICROSPHERE
1.0000 mL | INTRAVENOUS | Status: AC | PRN
  Administered 2021-11-02: 2.5 mL via INTRAVENOUS
  Filled 2021-11-02: qty 10

## 2021-11-02 MED ORDER — FUROSEMIDE 10 MG/ML IJ SOLN
20.0000 mg | Freq: Two times a day (BID) | INTRAMUSCULAR | Status: DC
  Administered 2021-11-02 – 2021-11-03 (×4): 20 mg via INTRAVENOUS
  Filled 2021-11-02 (×4): qty 2

## 2021-11-02 MED ORDER — POLYETHYLENE GLYCOL 3350 17 G PO PACK
17.0000 g | PACK | Freq: Every day | ORAL | Status: DC
  Administered 2021-11-02 – 2021-11-07 (×6): 17 g via ORAL
  Filled 2021-11-02 (×6): qty 1

## 2021-11-02 MED ORDER — IPRATROPIUM-ALBUTEROL 0.5-2.5 (3) MG/3ML IN SOLN
3.0000 mL | Freq: Three times a day (TID) | RESPIRATORY_TRACT | Status: DC
  Administered 2021-11-02 – 2021-11-04 (×5): 3 mL via RESPIRATORY_TRACT
  Filled 2021-11-02 (×5): qty 3

## 2021-11-02 NOTE — Progress Notes (Signed)
ANTICOAGULATION CONSULT NOTE   Pharmacy Consult for IV heparin Indication: atrial fibrillation  No Known Allergies  Patient Measurements: Height: 6\' 5"  (195.6 cm) Weight: 134.7 kg (297 lb) (scale a) IBW/kg (Calculated) : 89.1 Heparin Dosing Weight: 116.7 kg  Vital Signs: Temp: 97.5 F (36.4 C) (01/03 2245) Temp Source: Oral (01/03 2245) BP: 137/84 (01/03 2245) Pulse Rate: 78 (01/03 2245)  Labs: Recent Labs    11/01/21 1155 11/01/21 1422 11/01/21 1448 11/01/21 1653 11/01/21 2321  HGB 13.5  --  14.3  --   --   HCT 41.0  --  42.0  --   --   PLT 212  --   --   --   --   HEPARINUNFRC  --   --   --   --  0.57  CREATININE 0.88  --   --   --   --   TROPONINIHS  --  23*  --  25*  --      Estimated Creatinine Clearance: 81.3 mL/min (by C-G formula based on SCr of 0.88 mg/dL).   Medical History: History reviewed. No pertinent past medical history.  Assessment: 39 YOM with shortness of breath admitted for acute hypoxic respiratory failure. PMH significant for Afib with RVR. No history of anticoagulation prior to admission. Pharmacy to dose IV heparin.  Hemoglobin and platelets are within normal limits. Renal function is okay.   1/4 AM update:  Heparin level therapeutic   Goal of Therapy:  Heparin level 0.3-0.7 units/ml Monitor platelets by anticoagulation protocol: Yes   Plan:  Cont heparin at 1600 units/hr Confirmatory heparin level with AM labs  99, PharmD, BCPS Clinical Pharmacist Phone: [phone]

## 2021-11-02 NOTE — Evaluation (Signed)
Physical Therapy Evaluation Patient Details Name: Louis Koch MRN: [id number] DOB: 09/30/1927 Today's Date: 11/02/2021  History of Present Illness  Pt is a 88 y.o. male who presented 11/01/21 with SOB. Pt admitted with CHF and Afib with RVR. Chest x-ray showed cardiomegaly and interstitial congestion bilaterally suspicious for pulmonary congestion versus pneumonia. PMH: chronic diastolic CHF, moderate aortic stenosis, moderate mitral regurgitation ([facility] record 2015), bilateral knee OA status post replacement   Clinical Impression  Pt presents with condition above and deficits mentioned below, see PT Problem List. PTA, he was living with his significant other in a 1-level house with a level entrance, functioning independently without an AD, driving, and still working. Currently, pt displays deficits in activity tolerance/aerobic endurance and balance. He requested minA to power up to stand from a low surface, but this was likely due to his tall stature. Pt was able to mobilize up to ~128 ft before fatiguing without UE support or LOB, but did displays some impulsive movements and moments of trunk sway, requiring min guard for safety. Pt would benefit from further acute PT services to maximize his return to baseline and educate him on energy conservation techniques. I expect he will return to his baseline quickly.        Recommendations for follow up therapy are one component of a multi-disciplinary discharge planning process, led by the attending physician.  Recommendations may be updated based on patient status, additional functional criteria and insurance authorization.  Follow Up Recommendations No PT follow up    Assistance Recommended at Discharge PRN  Patient can return home with the following       Equipment Recommendations None recommended by PT  Recommendations for Other Services       Functional Status Assessment Patient has had a recent decline in their functional status and  demonstrates the ability to make significant improvements in function in a reasonable and predictable amount of time.     Precautions / Restrictions Precautions Precautions: Fall;Other (comment) Precaution Comments: monitor SpO2 Restrictions Weight Bearing Restrictions: No      Mobility  Bed Mobility Overal bed mobility: Modified Independent             General bed mobility comments: Pt able to transition supine > sit L EOB with HOB elevated without assistance.    Transfers Overall transfer level: Needs assistance Equipment used: 1 person hand held assist Transfers: Sit to/from Stand Sit to Stand: Min assist           General transfer comment: Pt requesting PT to assist in pulling tall-statured pt up to stand from low EOB, minA HHA.    Ambulation/Gait Ambulation/Gait assistance: Min guard Gait Distance (Feet): 128 Feet Assistive device: None Gait Pattern/deviations: Step-through pattern;Decreased stride length;Trunk flexed Gait velocity: reduced Gait velocity interpretation: >2.62 ft/sec, indicative of community ambulatory   General Gait Details: Pt with slight anterior and L lateral trunk flexion throughout gait, likely baseline due to hx of knee surgeries. Pt mostly steady, but with intermittent bouts of trunk sway, min guard for safety. Pt needing cues to slow pace to allow for safe line management and to conserve energy, but poor compliance by pt.  Stairs            Wheelchair Mobility    Modified Rankin (Stroke Patients Only)       Balance Overall balance assessment: Mild deficits observed, not formally tested  Pertinent Vitals/Pain Pain Assessment: Faces Faces Pain Scale: No hurt Pain Intervention(s): Monitored during session    Home Living Family/patient expects to be discharged to:: Private residence Living Arrangements: Spouse/significant other Available Help at Discharge:  Family;Available PRN/intermittently Type of Home: House Home Access: Level entry       Home Layout: One level Home Equipment: Grab bars - tub/shower;Shower seat - built Medical sales representative (2 wheels);Cane - single point;BSC/3in1      Prior Function Prior Level of Function : Working/employed;Driving;Independent/Modified Independent             Mobility Comments: Denies any recent falls. Reports he does x30 push-ups per day. Does not use an AD for mobility. ADLs Comments: Primarily sits for showers. Works in Electrical engineer for [employer]        Extremity/Trunk Assessment   Upper Extremity Assessment Upper Extremity Assessment: Overall WFL for tasks assessed    Lower Extremity Assessment Lower Extremity Assessment: Overall WFL for tasks assessed    Cervical / Trunk Assessment Cervical / Trunk Assessment: Kyphotic  Communication   Communication: No difficulties  Cognition Arousal/Alertness: Awake/alert Behavior During Therapy: WFL for tasks assessed/performed Overall Cognitive Status: Within Functional Limits for tasks assessed                                 General Comments: Pt needing cues to slow pace and allow PT to arrange lines to maintain safety at times, but likely close to baseline.        General Comments General comments (skin integrity, edema, etc.): HR up to low 100s, SpO2 96-97% on 3L O2    Exercises     Assessment/Plan    PT Assessment Patient needs continued PT services  PT Problem List Decreased activity tolerance;Decreased balance;Decreased mobility;Cardiopulmonary status limiting activity       PT Treatment Interventions DME instruction;Gait training;Functional mobility training;Therapeutic activities;Therapeutic exercise;Balance training;Neuromuscular re-education;Patient/family education    PT Goals (Current goals can be found in the Care Plan section)  Acute Rehab PT Goals Patient Stated Goal: to get  better and go home PT Goal Formulation: With patient Time For Goal Achievement: 11/16/21 Potential to Achieve Goals: Good    Frequency Min 3X/week     Co-evaluation               AM-PAC PT "6 Clicks" Mobility  Outcome Measure Help needed turning from your back to your side while in a flat bed without using bedrails?: None Help needed moving from lying on your back to sitting on the side of a flat bed without using bedrails?: None Help needed moving to and from a bed to a chair (including a wheelchair)?: A Little Help needed standing up from a chair using your arms (e.g., wheelchair or bedside chair)?: A Little Help needed to walk in hospital room?: A Little Help needed climbing 3-5 steps with a railing? : A Little 6 Click Score: 20    End of Session Equipment Utilized During Treatment: Oxygen Activity Tolerance: Patient tolerated treatment well;Patient limited by fatigue Patient left: in chair;with call bell/phone within reach Nurse Communication: Mobility status PT Visit Diagnosis: Unsteadiness on feet (R26.81);Other abnormalities of gait and mobility (R26.89);Difficulty in walking, not elsewhere classified (R26.2)    Time: [id number] PT Time Calculation (min) (ACUTE ONLY): 28 min   Charges:   PT Evaluation $PT Eval Low Complexity: 1 Low PT Treatments $Therapeutic Activity: 8-22 mins  Moishe Spice, PT, DPT Acute Rehabilitation Services  Pager: [phone] Office: [place] 11/02/2021, 9:00 AM

## 2021-11-02 NOTE — TOC Progression Note (Signed)
Transition of Care [facility]) - Progression Note    Patient Details  Name: Cobey Raineri MRN: [id number] Date of Birth: 1926/11/18  Transition of Care [facility]) CM/SW Contact  [name], RN Phone Number: 11/02/2021, 9:25 PM  Clinical Narrative:     Transition of Care [facility]) Screening Note   Patient Details  Name: Lavin Petteway Date of Birth: [birth year]   Transition of Care [facility]) CM/SW Contact:    [name], RN Phone Number: 11/02/2021, 9:25 PM    Transition of Care Department [facility]) has reviewed patient and no TOC needs have been identified at this time. We will continue to monitor patient advancement through interdisciplinary progression rounds. If new patient transition needs arise, please place a TOC consult.          Expected Discharge Plan and Services                                                 Social Determinants of Health (SDOH) Interventions    Readmission Risk Interventions No flowsheet data found.

## 2021-11-02 NOTE — Progress Notes (Signed)
PROGRESS NOTE    Louis Koch  [id number] DOB: 08/21/1927 DOA: 11/01/2021 PCP: Pcp, No  Brief Narrative: 89/M with history of paroxysmal atrial fibrillation, not on anticoagulation, moderate aortic stenosis, moderate MR presented to the ED with shortness of breath.  Patient has not seen a medical doctor in over 5+ years, reports progressive dyspnea on exertion for 1 month, significantly worse in the last 3 days with orthopnea.  Subsequently EMS was called, upon arrival O2 sats were in the 70s with wheezing and respiratory distress, he was placed on a nonrebreather, given nebulizations and brought to the ED. -In the emergency room he was noted to be in distress, chest x-ray noted cardiomegaly and interstitial edema   Assessment & Plan:  Acute hypoxic respiratory failure Acute CHF History of valvular heart disease, moderate AS, MR -Continue IV Lasix today -Follow-up echocardiogram, may need cardiology input depending on severity of valvular disease, EF etc. -Monitor I's/O, daily weights -Clinically do not suspect infectious process will discontinue antibiotics,  A. fib RVR -Continue low-dose metoprolol -Remains on IV heparin at this time, previously declined anticoagulation, discussed risks with patient, he will think about this -Follow-up echo  Hyponatremia -Secondary to hypovolemic state, monitor with diuresis  DVT prophylaxis: IV heparin Code Status: Full code Family Communication: Discussed patient in detail, no family at bedside Disposition Plan:  Status is: Inpatient  Remains inpatient appropriate because: Severity of illness  Consultants:    Procedures:   Antimicrobials:    Subjective: -Breathing starting to improve, history of severe orthopnea X 3 days  Objective: Vitals:   11/01/21 2245 11/02/21 0413 11/02/21 0818 11/02/21 1046  BP: 137/84 134/90 137/84 134/80  Pulse: 78  82 76  Resp: 20 18    Temp: (!) 97.5 F (36.4 C)  98.3 F (36.8 C) 97.7 F (36.5  C)  TempSrc: Oral  Oral Oral  SpO2: 93% 100% 99% 91%  Weight: 134.7 kg 134.6 kg    Height: 6\' 5"  (1.956 m)       Intake/Output Summary (Last 24 hours) at 11/02/2021 1047 Last data filed at 11/02/2021 1045 Gross per 24 hour  Intake 1447.38 ml  Output 450 ml  Net 997.38 ml   Filed Weights   11/01/21 1105 11/01/21 2245 11/02/21 0413  Weight: 129.3 kg 134.7 kg 134.6 kg    Examination:  General exam: Pleasant elderly male sitting up in bed, AAOx3, nondistressed HEENT: Positive JVD CVS: S1-S2, regular rhythm, systolic murmur Lungs: Few basilar rales Abdomen: Soft, obese, nontender, bowel sounds present Extremities: No edema Skin: No rash on exposed skin Psych: Appropriate mood and affect   Data Reviewed:   CBC: Recent Labs  Lab 11/01/21 1155 11/01/21 1448 11/02/21 0134  WBC 6.0  --  4.9  HGB 13.5 14.3 12.5*  HCT 41.0 42.0 37.6*  MCV 95.6  --  94.0  PLT 212  --  99991111   Basic Metabolic Panel: Recent Labs  Lab 11/01/21 1155 11/01/21 1448 11/02/21 0134  NA 133* 135 132*  K 3.5 3.7 4.2  CL 97*  --  99  CO2 27  --  26  GLUCOSE 130*  --  134*  BUN 11  --  14  CREATININE 0.88  --  0.76  CALCIUM 8.7*  --  8.7*   GFR: Estimated Creatinine Clearance: 89.4 mL/min (by C-G formula based on SCr of 0.76 mg/dL). Liver Function Tests: Recent Labs  Lab 11/01/21 1155  AST 32  ALT 28  ALKPHOS 104  BILITOT 2.4*  PROT  6.8  ALBUMIN 3.7   No results for input(s): LIPASE, AMYLASE in the last 168 hours. No results for input(s): AMMONIA in the last 168 hours. Coagulation Profile: No results for input(s): INR, PROTIME in the last 168 hours. Cardiac Enzymes: No results for input(s): CKTOTAL, CKMB, CKMBINDEX, TROPONINI in the last 168 hours. BNP (last 3 results) No results for input(s): PROBNP in the last 8760 hours. HbA1C: No results for input(s): HGBA1C in the last 72 hours. CBG: No results for input(s): GLUCAP in the last 168 hours. Lipid Profile: No results for  input(s): CHOL, HDL, LDLCALC, TRIG, CHOLHDL, LDLDIRECT in the last 72 hours. Thyroid Function Tests: No results for input(s): TSH, T4TOTAL, FREET4, T3FREE, THYROIDAB in the last 72 hours. Anemia Panel: No results for input(s): VITAMINB12, FOLATE, FERRITIN, TIBC, IRON, RETICCTPCT in the last 72 hours. Urine analysis: No results found for: COLORURINE, APPEARANCEUR, LABSPEC, PHURINE, GLUCOSEU, HGBUR, BILIRUBINUR, KETONESUR, PROTEINUR, UROBILINOGEN, NITRITE, LEUKOCYTESUR Sepsis Labs: @LABRCNTIP (procalcitonin:4,lacticidven:4)  ) Recent Results (from the past 240 hour(s))  Resp Panel by RT-PCR (Flu A&B, Covid) Nasopharyngeal Swab     Status: None   Collection Time: 11/01/21 11:30 AM   Specimen: Nasopharyngeal Swab; Nasopharyngeal(NP) swabs in vial transport medium  Result Value Ref Range Status   SARS Coronavirus 2 by RT PCR NEGATIVE NEGATIVE Final    Comment: (NOTE) SARS-CoV-2 target nucleic acids are NOT DETECTED.  The SARS-CoV-2 RNA is generally detectable in upper respiratory specimens during the acute phase of infection. The lowest concentration of SARS-CoV-2 viral copies this assay can detect is 138 copies/mL. A negative result does not preclude SARS-Cov-2 infection and should not be used as the sole basis for treatment or other patient management decisions. A negative result may occur with  improper specimen collection/handling, submission of specimen other than nasopharyngeal swab, presence of viral mutation(s) within the areas targeted by this assay, and inadequate number of viral copies(<138 copies/mL). A negative result must be combined with clinical observations, patient history, and epidemiological information. The expected result is Negative.  Fact Sheet for Patients:  EntrepreneurPulse.com.au  Fact Sheet for Healthcare Providers:  IncredibleEmployment.be  This test is no t yet approved or cleared by the [place] FDA and  has  been authorized for detection and/or diagnosis of SARS-CoV-2 by FDA under an Emergency Use Authorization (EUA). This EUA will remain  in effect (meaning this test can be used) for the duration of the COVID-19 declaration under Section 564(b)(1) of the Act, 21 U.S.C.section 360bbb-3(b)(1), unless the authorization is terminated  or revoked sooner.       Influenza A by PCR NEGATIVE NEGATIVE Final   Influenza B by PCR NEGATIVE NEGATIVE Final    Comment: (NOTE) The Xpert Xpress SARS-CoV-2/FLU/RSV plus assay is intended as an aid in the diagnosis of influenza from Nasopharyngeal swab specimens and should not be used as a sole basis for treatment. Nasal washings and aspirates are unacceptable for Xpert Xpress SARS-CoV-2/FLU/RSV testing.  Fact Sheet for Patients: EntrepreneurPulse.com.au  Fact Sheet for Healthcare Providers: IncredibleEmployment.be  This test is not yet approved or cleared by the [place] FDA and has been authorized for detection and/or diagnosis of SARS-CoV-2 by FDA under an Emergency Use Authorization (EUA). This EUA will remain in effect (meaning this test can be used) for the duration of the COVID-19 declaration under Section 564(b)(1) of the Act, 21 U.S.C. section 360bbb-3(b)(1), unless the authorization is terminated or revoked.  Performed at [facility], [place] [address], [place], [place] [address]  Radiology Studies: DG Chest Port 1 View  Result Date: 11/01/2021 CLINICAL DATA:  Shortness of breath EXAM: PORTABLE CHEST 1 VIEW COMPARISON:  None. FINDINGS: Transverse diameter of heart is increased. Central pulmonary vessels are prominent. Interstitial markings in the parahilar regions and lower lung fields are prominent. There is no focal pulmonary consolidation. Left lateral CP angle is not included in its entirety. There is no pneumothorax. Degenerative changes are noted in the right shoulder. IMPRESSION:  Cardiomegaly. There is prominence of interstitial markings in the parahilar regions and lower lung fields suggesting mild interstitial edema or interstitial pneumonia. There is no focal pulmonary consolidation. There is no pleural effusion. Electronically Signed   By: Elmer Picker M.D.   On: 11/01/2021 12:04     Scheduled Meds:  digoxin  0.125 mg Intravenous Q6H   furosemide  20 mg Intravenous BID   guaiFENesin  1,200 mg Oral BID   ipratropium-albuterol  3 mL Nebulization TID   metoprolol tartrate  12.5 mg Oral BID   polyethylene glycol  17 g Oral Daily   sodium chloride flush  3 mL Intravenous Q12H   Continuous Infusions:  sodium chloride     azithromycin Stopped (11/01/21 1613)   heparin 1,500 Units/hr (11/02/21 0822)     LOS: 1 day    Time spent: 90min    Domenic Polite, MD [facility]   11/02/2021, 10:47 AM

## 2021-11-02 NOTE — Progress Notes (Signed)
ANTICOAGULATION CONSULT NOTE - Follow Up Consult  Pharmacy Consult for Heparin Indication: atrial fibrillation  No Known Allergies  Patient Measurements: Height: 6\' 5"  (195.6 cm) Weight: 134.6 kg (296 lb 11.2 oz) IBW/kg (Calculated) : 89.1 Heparin Dosing Weight: 117 kg  Vital Signs: Temp: 97.5 F (36.4 C) (01/03 2245) Temp Source: Oral (01/03 2245) BP: 134/90 (01/04 0413) Pulse Rate: 78 (01/03 2245)  Labs: Recent Labs    11/01/21 1155 11/01/21 1422 11/01/21 1448 11/01/21 1653 11/01/21 2321 11/02/21 0134  HGB 13.5  --  14.3  --   --  12.5*  HCT 41.0  --  42.0  --   --  37.6*  PLT 212  --   --   --   --  218  HEPARINUNFRC  --   --   --   --  0.57 0.76*  CREATININE 0.88  --   --   --   --  0.76  TROPONINIHS  --  23*  --  25*  --   --     Estimated Creatinine Clearance: 89.4 mL/min (by C-G formula based on SCr of 0.76 mg/dL).  Assessment: 80 YOM with shortness of breath admitted for acute hypoxic respiratory failure. PMH significant for Afib with RVR. No history of anticoagulation prior to admission. Pharmacy to dose IV heparin.    Initial heparin level therapeutic (0.57) on 1600 units/hr.  Subsequent level is just above target range (0.76).  Goal of Therapy:  Heparin level 0.3-0.7 units/ml Monitor platelets by anticoagulation protocol: Yes   Plan:  Decrease heparin drip to 1500 units/hr Daily heparin level and CBC. Follow up anticoagulation plans.  99, RPh 11/02/2021,8:16 AM

## 2021-11-02 NOTE — Progress Notes (Signed)
°  Echocardiogram 2D Echocardiogram has been performed.  Fidel Levy 11/02/2021, 2:04 PM

## 2021-11-02 NOTE — Progress Notes (Signed)
Heart Failure Nurse Navigator Progress Note  Following this hospitalization to assess for HV TOC readiness.   Pending ECHO results at this time.  Will plan to interview pt tomorrow.  Ozella Rocks, MSN, RN Heart Failure Nurse Navigator [phone]

## 2021-11-02 NOTE — Plan of Care (Signed)
  Problem: Education: Goal: Knowledge of General Education information will improve Description: Including pain rating scale, medication(s)/side effects and non-pharmacologic comfort measures Outcome: Progressing   Problem: Clinical Measurements: Goal: Ability to maintain clinical measurements within normal limits will improve Outcome: Progressing   Problem: Clinical Measurements: Goal: Diagnostic test results will improve Outcome: Progressing   

## 2021-11-02 NOTE — Plan of Care (Signed)

## 2021-11-02 NOTE — Progress Notes (Signed)
Mobility Specialist Progress Note:   11/02/21 1248  Mobility  Activity Ambulated in hall  Level of Assistance Contact guard assist, steadying assist  Assistive Device None  Distance Ambulated (ft) 120 ft  Mobility Ambulated with assistance in hallway  Mobility Response Tolerated well  Mobility performed by Mobility specialist  Bed Position Chair  $Mobility charge 1 Mobility   Pt received in chair willing to participate in mobility. No complaints of pain. Pt returned to chair with call bell in reach and all needs me.  [facility] Designer, jewellery Phone [phone] Secondary Phone [phone]

## 2021-11-03 ENCOUNTER — Encounter (HOSPITAL_COMMUNITY): Payer: Self-pay | Admitting: Internal Medicine

## 2021-11-03 ENCOUNTER — Other Ambulatory Visit: Payer: Self-pay | Admitting: Physician Assistant

## 2021-11-03 DIAGNOSIS — I4821 Permanent atrial fibrillation: Secondary | ICD-10-CM | POA: Diagnosis not present

## 2021-11-03 DIAGNOSIS — J9601 Acute respiratory failure with hypoxia: Secondary | ICD-10-CM | POA: Diagnosis not present

## 2021-11-03 DIAGNOSIS — I350 Nonrheumatic aortic (valve) stenosis: Secondary | ICD-10-CM

## 2021-11-03 DIAGNOSIS — I5031 Acute diastolic (congestive) heart failure: Secondary | ICD-10-CM

## 2021-11-03 DIAGNOSIS — I739 Peripheral vascular disease, unspecified: Secondary | ICD-10-CM

## 2021-11-03 LAB — CBC
HCT: 39.2 % (ref 39.0–52.0)
Hemoglobin: 13 g/dL (ref 13.0–17.0)
MCH: 31.6 pg (ref 26.0–34.0)
MCHC: 33.2 g/dL (ref 30.0–36.0)
MCV: 95.1 fL (ref 80.0–100.0)
Platelets: 197 10*3/uL (ref 150–400)
RBC: 4.12 MIL/uL — ABNORMAL LOW (ref 4.22–5.81)
RDW: 14 % (ref 11.5–15.5)
WBC: 8.8 10*3/uL (ref 4.0–10.5)
nRBC: 0 % (ref 0.0–0.2)

## 2021-11-03 LAB — BASIC METABOLIC PANEL
Anion gap: 8 (ref 5–15)
BUN: 18 mg/dL (ref 8–23)
CO2: 24 mmol/L (ref 22–32)
Calcium: 8.5 mg/dL — ABNORMAL LOW (ref 8.9–10.3)
Chloride: 100 mmol/L (ref 98–111)
Creatinine, Ser: 0.89 mg/dL (ref 0.61–1.24)
GFR, Estimated: >60 mL/min (ref >60)
Glucose, Bld: 111 mg/dL — ABNORMAL HIGH (ref 70–99)
Potassium: 3.9 mmol/L (ref 3.5–5.1)
Sodium: 132 mmol/L — ABNORMAL LOW (ref 135–145)

## 2021-11-03 LAB — HEPARIN LEVEL (UNFRACTIONATED): Heparin Unfractionated: 0.52 IU/mL (ref 0.30–0.70)

## 2021-11-03 MED ORDER — ASPIRIN 81 MG PO CHEW
81.0000 mg | CHEWABLE_TABLET | ORAL | Status: AC
  Administered 2021-11-04: 81 mg via ORAL
  Filled 2021-11-03: qty 1

## 2021-11-03 MED ORDER — SODIUM CHLORIDE 0.9 % IV SOLN: 250.0000 mL | INTRAVENOUS | Status: DC | PRN

## 2021-11-03 MED ORDER — SODIUM CHLORIDE 0.9% FLUSH: 3.0000 mL | INTRAVENOUS | Status: DC | PRN

## 2021-11-03 MED ORDER — POTASSIUM CHLORIDE CRYS ER 20 MEQ PO TBCR
40.0000 meq | EXTENDED_RELEASE_TABLET | Freq: Once | ORAL | Status: AC
  Administered 2021-11-03: 40 meq via ORAL
  Filled 2021-11-03: qty 2

## 2021-11-03 MED ORDER — SODIUM CHLORIDE 0.9% FLUSH
3.0000 mL | Freq: Two times a day (BID) | INTRAVENOUS | Status: DC
  Administered 2021-11-04: 3 mL via INTRAVENOUS

## 2021-11-03 MED ORDER — SODIUM CHLORIDE 0.9 % IV SOLN: INTRAVENOUS | Status: DC

## 2021-11-03 NOTE — Progress Notes (Signed)
ANTICOAGULATION CONSULT NOTE - Follow Up Consult  Pharmacy Consult for Heparin Indication: atrial fibrillation  No Known Allergies  Patient Measurements: Height: 6\' 5"  (195.6 cm) Weight: 132.9 kg (292 lb 14.4 oz) (scale a) IBW/kg (Calculated) : 89.1 Heparin Dosing Weight: 117 kg  Vital Signs: Temp: 98.2 F (36.8 C) (01/05 0416) Temp Source: Oral (01/05 0416) BP: 131/96 (01/05 0816) Pulse Rate: 87 (01/05 0816)  Labs: Recent Labs    11/01/21 1155 11/01/21 1422 11/01/21 1448 11/01/21 1653 11/01/21 2321 11/02/21 0134 11/03/21 0111  HGB 13.5  --  14.3  --   --  12.5* 13.0  HCT 41.0  --  42.0  --   --  37.6* 39.2  PLT 212  --   --   --   --  218 197  HEPARINUNFRC  --   --   --   --  0.57 0.76* 0.52  CREATININE 0.88  --   --   --   --  0.76 0.89  TROPONINIHS  --  23*  --  25*  --   --   --      Estimated Creatinine Clearance: 79.9 mL/min (by C-G formula based on SCr of 0.89 mg/dL).  Assessment: 62 YOM with shortness of breath admitted for acute hypoxic respiratory failure. PMH significant for Afib with RVR. No history of anticoagulation prior to admission. Pharmacy to dose IV heparin.    Heparin level is therapeutic (0.52) on 1500 units/hr. CBC stable.   Noted discussing anticoagulation, previously declined.  Goal of Therapy:  Heparin level 0.3-0.7 units/ml Monitor platelets by anticoagulation protocol: Yes   Plan:  Continue heparin drip at 1500 units/hr Daily heparin level and CBC. Follow up anticoagulation plans.  99, RPh 11/03/2021,8:18 AM

## 2021-11-03 NOTE — Consult Note (Signed)
Cardiology Consultation:   Patient ID: Louis Koch MRN: [id number]; DOB: 07/26/1927  Admit date: 11/01/2021 Date of Consult: 11/03/2021  PCP:  Oneita Hurt, No   [facility] Providers Cardiologist:  None      Patient Profile:   Louis Koch is a 88 y.o. male with a hx of chronic diastolic heart failure, moderate aortic stenosis, moderate mitral regurgitation, paroxysmal atrial fibrillation who is being seen 11/03/2021 for the evaluation of heart failure at the request of Dr. Jomarie Longs.  History of Present Illness:   Louis Koch  is a 88 y.o. male with a hx of chronic diastolic heart failure, moderate aortic stenosis, moderate mitral regurgitation, paroxysmal atrial fibrillation who is being seen 11/03/2021 for the evaluation of heart failure at the request of Dr. Jomarie Longs. Patient has not regularly seen a doctor in the past 5 years.   Patient was coincidentally found to be in new onset atrial fibrillation in 2014 during a preoperative evaluation for cataract surgery. Echo in 2014 showed normal LV function and EF, valvular disease. Was started on metoprolol, but patient refused anticoagulation. Patient has not followed up with cardiology since then. Patient had an infected prosthetic knee joint in 2015 and was treated by ID. It appears that the most recent echocardiogram was taken on 01/19/14 for evaluation of SOB and showed EF 65-70%, Normal LV/RV function, moderate aortic sclerosis, mild tricuspid stenosis. Patient has not been seeing any medical doctor for the last 5 years.   Patient presented to the ED via EMS on 1/3 with a chief complaint of shortness of breath that had been going on for 2 weeks. Per EMS, patient was found in Atrial Fibrillation, was hypertensive, and had sats in the 60s. Patient was lethargic and confused. His son reported that the patient had been complaining about increasing SOB on exertion over the past few weeks. Labs in the ED showed Na 133, K 3.5, BUN 11, creatinine 0.88, WBC 6.0,  hemoglobin 13.5, hematocrit, 41.0. Viral respiratory panel negative. BNP was elevated to 264.0. HSTN 23>>25. Venous blood gas showed pO2<31.0. Chest x-ray showed cardiomegaly, prominence of interstitial markings suggesting mild interstitial edema or interstitial pneumonia. EKG showed atrial fibrillation with a rate of 111. Echo from 11/02/21 suggested severe (vs moderate-to-severe) paradoxical low flow, low gradient aortic stenosis. EF 55-60%, normal LV wall motion, mild LV hypertrophy. Diastolic parameters consistent with Grade II diastolic dysfunction.   Patient reports that he had injured a muscle in his groin about 5 weeks ago that caused him to be less active than usual. Reported sob on exertion, palpitations when standing up. Denies any episodes of chest pain, dizziness, cough, swelling in legs. He has been told that he has had a murmur for as long as he can remember.   History reviewed. No pertinent past medical history.  Past Surgical History:  Procedure Laterality Date   KNEE SURGERY       Home Medications:  Prior to Admission medications   Medication Sig Start Date End Date Taking? Authorizing Provider  aspirin 500 MG EC tablet Take 500 mg by mouth every evening.   Yes [provider]  B Complex Vitamins (B COMPLEX PO) Take 1 tablet by mouth daily.   Yes [provider]  Cholecalciferol (D3 PO) Take 1 capsule by mouth daily.   Yes [provider]  Cyanocobalamin (B-12 PO) Take 1 tablet by mouth daily.   Yes [provider]  Multiple Vitamin (MULTIVITAMIN WITH MINERALS) TABS tablet Take 1 tablet by mouth daily.  Yes [provider]    Inpatient Medications: Scheduled Meds:  furosemide  20 mg Intravenous BID   guaiFENesin  1,200 mg Oral BID   ipratropium-albuterol  3 mL Nebulization TID   metoprolol tartrate  12.5 mg Oral BID   polyethylene glycol  17 g Oral Daily   sodium chloride flush  3 mL Intravenous Q12H   Continuous Infusions:   sodium chloride     heparin 1,500 Units/hr (11/02/21 2032)   PRN Meds: sodium chloride, acetaminophen, ondansetron (ZOFRAN) IV, sodium chloride flush  Allergies:   No Known Allergies  Social History:   Social History   Socioeconomic History   Marital status: Unknown    Spouse name: Not on file   Number of children: Not on file   Years of education: Not on file   Highest education level: Not on file  Occupational History   Not on file  Tobacco Use   Smoking status: Never   Smokeless tobacco: Not on file  Substance and Sexual Activity   Alcohol use: Yes   Drug use: No   Sexual activity: Not on file  Other Topics Concern   Not on file  Social History Narrative   Not on file   Social Determinants of Health   Financial Resource Strain: Not on file  Food Insecurity: Not on file  Transportation Needs: Not on file  Physical Activity: Not on file  Stress: Not on file  Social Connections: Not on file  Intimate Partner Violence: Not on file    Family History:   History reviewed. No pertinent family history.  Patient denies family history of heart attack, endorses family history of diabetes   ROS:  Please see the history of present illness.   All other ROS reviewed and negative.     Physical Exam/Data:   Vitals:   11/02/21 1918 11/02/21 2021 11/03/21 0416 11/03/21 0816  BP: 134/82  (!) 163/99 (!) 131/96  Pulse:   86 87  Resp: 20  20   Temp: 97.8 F (36.6 C)  98.2 F (36.8 C)   TempSrc: Oral  Oral   SpO2: 96% 95%  99%  Weight:   132.9 kg   Height:        Intake/Output Summary (Last 24 hours) at 11/03/2021 1146 Last data filed at 11/03/2021 1105 Gross per 24 hour  Intake 1079.44 ml  Output 2250 ml  Net -1170.56 ml   Last 3 Weights 11/03/2021 11/02/2021 11/01/2021  Weight (lbs) 292 lb 14.4 oz 296 lb 11.2 oz 297 lb  Weight (kg) 132.859 kg 134.582 kg 134.718 kg     Body mass index is 34.73 kg/m.  General:  Well nourished, well developed, in no acute  distress HEENT: normal Neck: no JVD Vascular: Distal pulses 2+ bilaterally Cardiac: Irregular, grade 3/6 systolic murmur heard throughout  Lungs:  Crackles in the lower lung fields  Abd: soft, nontender Ext: no pedal edema Musculoskeletal:  No deformities Skin: warm and dry  Neuro:  CNs 2-12 intact, no focal abnormalities noted Psych:  Normal affect   EKG:  The EKG was personally reviewed and demonstrates:  Atrial fibrillation, rate 111 Telemetry:  Telemetry was personally reviewed and demonstrates:  Atrial fibrillation, occasional PVCs   Relevant CV Studies:  Echo 11/02/21  1. The aortic valve is incompletely visualized on SAX view, however,  suspect severe (vs moderate-to-severe) paradoxical low flow, low gradient  aortic stenosis with AVA 0.8cm2 by continuity, mean gradient 25mmHg, Vmax  3.2672m/s, DI 0.23, SVi 22.  Notably the   LVOT VTI may be underestimated due to suboptimal doppler angle.   2. Left ventricular ejection fraction, by estimation, is 55 to 60%. The  left ventricle has normal function. The left ventricle has no regional  wall motion abnormalities. There is mild asymmetric left ventricular  hypertrophy of the basal-septal segment.  Left ventricular diastolic parameters are consistent with Grade II  diastolic dysfunction (pseudonormalization).   3. Right ventricular systolic function is mildly reduced. The right  ventricular size is normal. There is mildly elevated pulmonary artery  systolic pressure.   4. Left atrial size was mildly dilated.   5. The mitral valve is degenerative. Mild mitral valve regurgitation.  Moderate mitral annular calcification.   6. The aortic valve is tricuspid. There is moderate calcification of the  aortic valve. There is moderate thickening of the aortic valve. Aortic  valve regurgitation is mild. Severe aortic valve stenosis.   7. Aortic dilatation noted. There is moderate dilatation of the ascending  aorta, measuring 44 mm.   8. The  inferior vena cava is dilated in size with <50% respiratory  variability, suggesting right atrial pressure of 15 mmHg.   Laboratory Data:  High Sensitivity Troponin:   Recent Labs  Lab 11/01/21 1422 11/01/21 1653  TROPONINIHS 23* 25*     Chemistry Recent Labs  Lab 11/01/21 1155 11/01/21 1448 11/02/21 0134 11/03/21 0111  NA 133* 135 132* 132*  K 3.5 3.7 4.2 3.9  CL 97*  --  99 100  CO2 27  --  26 24  GLUCOSE 130*  --  134* 111*  BUN 11  --  14 18  CREATININE 0.88  --  0.76 0.89  CALCIUM 8.7*  --  8.7* 8.5*  GFRNONAA >60  --  >60 >60  ANIONGAP 9  --  7 8    Recent Labs  Lab 11/01/21 1155  PROT 6.8  ALBUMIN 3.7  AST 32  ALT 28  ALKPHOS 104  BILITOT 2.4*   Lipids No results for input(s): CHOL, TRIG, HDL, LABVLDL, LDLCALC, CHOLHDL in the last 168 hours.  Hematology Recent Labs  Lab 11/01/21 1155 11/01/21 1448 11/02/21 0134 11/03/21 0111  WBC 6.0  --  4.9 8.8  RBC 4.29  --  4.00* 4.12*  HGB 13.5 14.3 12.5* 13.0  HCT 41.0 42.0 37.6* 39.2  MCV 95.6  --  94.0 95.1  MCH 31.5  --  31.3 31.6  MCHC 32.9  --  33.2 33.2  RDW 14.1  --  13.8 14.0  PLT 212  --  218 197   Thyroid No results for input(s): TSH, FREET4 in the last 168 hours.  BNP Recent Labs  Lab 11/01/21 1157  BNP 264.0*    DDimer No results for input(s): DDIMER in the last 168 hours.   Radiology/Studies:  DG Chest Port 1 View  Result Date: 11/01/2021 CLINICAL DATA:  Shortness of breath EXAM: PORTABLE CHEST 1 VIEW COMPARISON:  None. FINDINGS: Transverse diameter of heart is increased. Central pulmonary vessels are prominent. Interstitial markings in the parahilar regions and lower lung fields are prominent. There is no focal pulmonary consolidation. Left lateral CP angle is not included in its entirety. There is no pneumothorax. Degenerative changes are noted in the right shoulder. IMPRESSION: Cardiomegaly. There is prominence of interstitial markings in the parahilar regions and lower lung fields  suggesting mild interstitial edema or interstitial pneumonia. There is no focal pulmonary consolidation. There is no pleural effusion. Electronically Signed   By: Royston Cowper  Rathinasamy M.D.   On: 11/01/2021 12:04   ECHOCARDIOGRAM COMPLETE  Result Date: 11/02/2021    ECHOCARDIOGRAM REPORT   Patient Name:   Louis Koch Date of Exam: 11/02/2021 Medical Rec #:  [id number]    Height:       77.0 in Accession #:    [id number]   Weight:       296.7 lb Date of Birth:  05/18/1927    BSA:          2.645 m Patient Age:    41 years     BP:           134/90 mmHg Patient Gender: M            HR:           74 bpm. Exam Location:  Inpatient Procedure: 2D Echo, Cardiac Doppler and Color Doppler Indications:    CHF-Acute Diastolic [id number]  History:        Patient has no prior history of Echocardiogram examinations.  Sonographer:    Bernadene Person RDCS Referring Phys: [id number] [place]  1. The aortic valve is incompletely visualized on SAX view, however, suspect severe (vs moderate-to-severe) paradoxical low flow, low gradient aortic stenosis with AVA 0.8cm2 by continuity, mean gradient 75mmHg, Vmax 3.68m/s, DI 0.23, SVi 22. Notably the  LVOT VTI may be underestimated due to suboptimal doppler angle.  2. Left ventricular ejection fraction, by estimation, is 55 to 60%. The left ventricle has normal function. The left ventricle has no regional wall motion abnormalities. There is mild asymmetric left ventricular hypertrophy of the basal-septal segment. Left ventricular diastolic parameters are consistent with Grade II diastolic dysfunction (pseudonormalization).  3. Right ventricular systolic function is mildly reduced. The right ventricular size is normal. There is mildly elevated pulmonary artery systolic pressure.  4. Left atrial size was mildly dilated.  5. The mitral valve is degenerative. Mild mitral valve regurgitation. Moderate mitral annular calcification.  6. The aortic valve is tricuspid. There is moderate  calcification of the aortic valve. There is moderate thickening of the aortic valve. Aortic valve regurgitation is mild. Severe aortic valve stenosis.  7. Aortic dilatation noted. There is moderate dilatation of the ascending aorta, measuring 44 mm.  8. The inferior vena cava is dilated in size with <50% respiratory variability, suggesting right atrial pressure of 15 mmHg. Comparison(s): No prior Echocardiogram. FINDINGS  Left Ventricle: Left ventricular ejection fraction, by estimation, is 55 to 60%. The left ventricle has normal function. The left ventricle has no regional wall motion abnormalities. The left ventricular internal cavity size was normal in size. There is  mild asymmetric left ventricular hypertrophy of the basal-septal segment. Left ventricular diastolic parameters are consistent with Grade II diastolic dysfunction (pseudonormalization). Right Ventricle: The right ventricular size is normal. Right vetricular wall thickness was not well visualized. Right ventricular systolic function is mildly reduced. There is mildly elevated pulmonary artery systolic pressure. The tricuspid regurgitant velocity is 2.69 m/s, and with an assumed right atrial pressure of 15 mmHg, the estimated right ventricular systolic pressure is [id number] mmHg. Left Atrium: Left atrial size was mildly dilated. Right Atrium: Right atrial size was normal in size. Pericardium: There is no evidence of pericardial effusion. Mitral Valve: The mitral valve is degenerative in appearance. There is mild thickening of the mitral valve leaflet(s). There is mild calcification of the mitral valve leaflet(s). Moderate mitral annular calcification. Mild mitral valve regurgitation. Tricuspid Valve: The tricuspid valve is normal in structure. Tricuspid valve regurgitation  is mild. Aortic Valve: The aortic valve is tricuspid. There is moderate calcification of the aortic valve. There is moderate thickening of the aortic valve. Aortic valve regurgitation  is mild. Aortic regurgitation PHT measures 500 msec. Severe aortic stenosis is present. Aortic valve mean gradient measures 22.0 mmHg. Aortic valve peak gradient measures 32.8 mmHg. Aortic valve area, by VTI measures 0.83 cm. Pulmonic Valve: The pulmonic valve was not well visualized. Pulmonic valve regurgitation is trivial. Aorta: The aortic root is normal in size and structure and aortic dilatation noted. There is moderate dilatation of the ascending aorta, measuring 44 mm. Venous: The inferior vena cava is dilated in size with less than 50% respiratory variability, suggesting right atrial pressure of 15 mmHg. IAS/Shunts: The atrial septum is grossly normal.  LEFT VENTRICLE PLAX 2D LVIDd:         4.70 cm      Diastology LVIDs:         3.10 cm      LV e' medial:    4.60 cm/s LV PW:         1.00 cm      LV E/e' medial:  33.0 LV IVS:        0.90 cm      LV e' lateral:   6.05 cm/s LVOT diam:     2.10 cm      LV E/e' lateral: 25.1 LV SV:         58 LV SV Index:   22 LVOT Area:     3.46 cm  LV Volumes (MOD) LV vol d, MOD A2C: 68.4 ml LV vol d, MOD A4C: 127.0 ml LV vol s, MOD A2C: 29.4 ml LV vol s, MOD A4C: 55.2 ml LV SV MOD A2C:     39.0 ml LV SV MOD A4C:     127.0 ml LV SV MOD BP:      53.0 ml RIGHT VENTRICLE RV S prime:     7.76 cm/s TAPSE (M-mode): 1.7 cm LEFT ATRIUM             Index        RIGHT ATRIUM           Index LA diam:        4.40 cm 1.66 cm/m   RA Area:     17.70 cm LA Vol (A2C):   87.4 ml 33.04 ml/m  RA Volume:   45.00 ml  17.01 ml/m LA Vol (A4C):   95.4 ml 36.07 ml/m LA Biplane Vol: 93.0 ml 35.16 ml/m  AORTIC VALVE AV Area (Vmax):    0.93 cm AV Area (Vmean):   0.83 cm AV Area (VTI):     0.83 cm AV Vmax:           286.33 cm/s AV Vmean:          222.667 cm/s AV VTI:            0.702 m AV Peak Grad:      32.8 mmHg AV Mean Grad:      22.0 mmHg LVOT Vmax:         77.17 cm/s LVOT Vmean:        53.333 cm/s LVOT VTI:          0.168 m LVOT/AV VTI ratio: 0.24 AI PHT:            500 msec  AORTA Ao Root  diam: 3.50 cm Ao Asc diam:  4.40 cm MITRAL VALVE  TRICUSPID VALVE MV Area (PHT): 3.77 cm     TR Peak grad:   28.9 mmHg MV Decel Time: 201 msec     TR Vmax:        269.00 cm/s MR Peak grad: 112.8 mmHg MR Mean grad: 73.0 mmHg     SHUNTS MR Vmax:      531.00 cm/s   Systemic VTI:  0.17 m MR Vmean:     411.0 cm/s    Systemic Diam: 2.10 cm MV E velocity: 152.00 cm/s MV A velocity: 85.00 cm/s MV E/A ratio:  1.79 Gwyndolyn Kaufman MD Electronically signed by Gwyndolyn Kaufman MD Signature Date/Time: 11/02/2021/4:50:05 PM    Final      Assessment and Plan:   Severe Aortic Stenosis: Aortic stenosis was identified as moderate on echo in 2015. Echo from 11/02/21 showed severe low flow, low gradient aortic stenosis. - Plan to evaluate for TAVR. Structural team plans to see today while inpatient.    Acute hypoxic respiratory failure in the setting of acute on chronic diastolic CHF  - Echocardiogram on 11/02/21 showed grade II diastolic dysfunction, EF 0000000, severe aortic stenosis, moderate mitral regurgitation  - Patient was hypoxic on presentation, O2 sats have improved with nasal cannula on 3L/min - Patient appeared fluid overloaded on presentation per H&P from hospitalist, patient currently on lasix 20mg  IV BID (4 doses given so far). Output 2.7 L today, currently net -173.63mL total. Weight decreased from 134.7 kg- 132.9 kg since admission. Creatinine stable at 0.89 -Okay to continue lasix for one more day  - Plan to continue BB outpatient   Atrial Fibrillation  - First identified in 2014, patient was seeing a cardiologist but never returned for follow up appointments  - Currently on IV heparin. Patient has refused anticoagulation in the past, but is now agreeable. Plan to transition to eliquis following completion of all planned procedures.   - Continue metoprolol tartate 12.5 mg BID as started by the hospitalist service. Patient had been prescribed metoprolol in the past, but he decided to stop  taking it. Currently not taking any rate control medications at home  - Plan to continue metoprolol 12.5 mg BID and anticoagulation with DOAC once discharged     Risk Assessment/Risk Scores:      New York Heart Association (NYHA) Functional Class NYHA Class II  CHA2DS2-VASc Score = 3  This indicates a 3.2% annual risk of stroke. The patient's score is based upon: CHF History: 1 HTN History: 0 Diabetes History: 0 Stroke History: 0 Vascular Disease History: 0 Age Score: 2 Gender Score: 0        For questions or updates, please contact [place] Please consult [email or web address] for contact info under    Signed, Margie Billet, PA-C  11/03/2021 11:46 AM

## 2021-11-03 NOTE — Consult Note (Addendum)
[place] VALVE TEAM  Cardiology Consultation:   Patient ID: Louis Koch MRN: [id number]; DOB: May 07, 1927  Admit date: 11/01/2021 Date of Consult: 11/03/2021  Primary Care Provider: Merryl Hacker No [facility] Cardiologist: Buford Dresser, MD (new) [place] Electrophysiologist:  None    Patient Profile:   Louis Koch is a 88 y.o. male with a hx of prosthetic knee infection (2015), aortic sclerosis, moderate MR, PAF (not on [place] due to patient refusal) who is being seen today for the evaluation of acute CHF and paradoxical LFLG AS at the request of Dr. Harrell Gave.  History of Present Illness:   Mr. Bellows lives with his male companion in a condominium in [place], [place]. He has 4 sons and multiple grandchildren/great grandchildren with whom he is very close. 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 He continues to work everyday for a family owned [employer] with his son, Jenny Reichmann. 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 He has been active all his life and still exercises everyday doing pushups, walking and free weights. He lives totally independently and takes care of all of his own ADLs. He has no significant memory impairment. He has had regular dental care and no active issues.   The patient reports having had a heart murmur his whole life. He has been very healthy and it never caused him any limitation. His cardiac history dates back to 2014 when he was incidentally found to be in new onset atrial fibrillation during a preoperative evaluation for cataract surgery. He was started on metoprolol, but patient refused anticoagulation. He later self discontinued metoprolol. Patient has not followed up with cardiology since then. Per review of CareEverywhere, most recent echo 01/19/14 at [facility] showed EF 65-70%,  moderate aortic sclerosis, moderate MR/ mild TR. Patient has not been seeing any medical doctor for the last 5 years.   He was in his usual state of health until ~ 1 month ago when he pulled a groin muscle causing him to "slow down." Since that time, he has noticed worsening exercise intolerance, dyspnea on exertion, orthopnea and PND. He had "panic attacks" where he would have pursed lipped breathing and panting.   The patient finally presented to the ER for evaluation via EMS on 1/3 for evaluation of shortness of breath and found to have acute hypoxic respiratory failure. Per EMS, patient was found in atrial fibrillation with RVR, hypertensive, and had 02 sats in the 60s. Patient was lethargic and confused. Labs in the ED showed Na 133, K 3.5, BUN 11, creatinine 0.88, WBC 6.0, hemoglobin 13.5, hematocrit, 41.0. Viral respiratory panel negative. BNP was elevated to 264.0. HSTN 23>>25. Venous blood gas showed pO2<31.0. Chest x-ray showed cardiomegaly, prominence of interstitial markings suggesting mild interstitial edema or interstitial pneumonia. EKG showed atrial fibrillation with a rate of 111. Echo from 11/02/21 suggested severe (vs moderate-to-severe) paradoxical low flow, low gradient aortic stenosis: mean gradient 22 mm hg, peak 32.8 mmhg, AVA 0.83 cm2, DVI 24, SVI 22. EF 55-60%, normal LV wall motion, mild LV hypertrophy, G2DD, mild LAE, mild MR. There is moderate dilatation of the ascending aorta, measuring 44 mm.   He was started on IV lasix and IV heparin as well as Lopressor 12.5mg  BID. He has a good response to IV lasix and weight down 5 lbs. He is breathing much  better but not to his baseline. He denies CP, LE edema, dizziness or syncope. He is eager to start work up for TAVR and have his valve fixed.    Past Medical History:  Diagnosis Date   Chronic diastolic (congestive) heart failure (HCC)    Infected prosthetic knee joint, sequela    Mitral regurgitation    PAF (paroxysmal atrial  fibrillation) (HCC)    Severe aortic stenosis     Past Surgical History:  Procedure Laterality Date   KNEE SURGERY       Home Medications:  Prior to Admission medications   Medication Sig Start Date End Date Taking? Authorizing Provider  aspirin 500 MG EC tablet Take 500 mg by mouth every evening.   Yes [provider]  B Complex Vitamins (B COMPLEX PO) Take 1 tablet by mouth daily.   Yes [provider]  Cholecalciferol (D3 PO) Take 1 capsule by mouth daily.   Yes [provider]  Cyanocobalamin (B-12 PO) Take 1 tablet by mouth daily.   Yes [provider]  Multiple Vitamin (MULTIVITAMIN WITH MINERALS) TABS tablet Take 1 tablet by mouth daily.   Yes [provider]    Inpatient Medications: Scheduled Meds:  furosemide  20 mg Intravenous BID   guaiFENesin  1,200 mg Oral BID   ipratropium-albuterol  3 mL Nebulization TID   metoprolol tartrate  12.5 mg Oral BID   polyethylene glycol  17 g Oral Daily   sodium chloride flush  3 mL Intravenous Q12H   sodium chloride flush  3 mL Intravenous Q12H   Continuous Infusions:  sodium chloride     heparin 1,500 Units/hr (11/03/21 1316)   PRN Meds: sodium chloride, acetaminophen, ondansetron (ZOFRAN) IV, sodium chloride flush  Allergies:   No Known Allergies  Social History:   Social History   Socioeconomic History   Marital status: Unknown    Spouse name: Not on file   Number of children: Not on file   Years of education: Not on file   Highest education level: Not on file  Occupational History   Not on file  Tobacco Use   Smoking status: Never   Smokeless tobacco: Not on file  Substance and Sexual Activity   Alcohol use: Yes   Drug use: No   Sexual activity: Not on file  Other Topics Concern   Not on file  Social History Narrative   Not on file   Social Determinants of Health   Financial Resource Strain: Not on file  Food Insecurity: Not on file  Transportation Needs: Not  on file  Physical Activity: Not on file  Stress: Not on file  Social Connections: Not on file  Intimate Partner Violence: Not on file    Family History:   History reviewed. No pertinent family history.   ROS:  Please see the history of present illness.  All other ROS reviewed and negative.     Physical Exam/Data:   Vitals:   11/02/21 2021 11/03/21 0416 11/03/21 0816 11/03/21 1154  BP:  (!) 163/99 (!) 131/96 (!) 143/84  Pulse:  86 87 79  Resp:  20    Temp:  98.2 F (36.8 C)  (!) 97.4 F (36.3 C)  TempSrc:  Oral  Oral  SpO2: 95%  99% 100%  Weight:  132.9 kg    Height:        Intake/Output Summary (Last 24 hours) at 11/03/2021 1617 Last data filed at 11/03/2021 1515 Gross per 24 hour  Intake  1319.44 ml  Output 2250 ml  Net -930.56 ml   Last 3 Weights 11/03/2021 11/02/2021 11/01/2021  Weight (lbs) 292 lb 14.4 oz 296 lb 11.2 oz 297 lb  Weight (kg) 132.859 kg 134.582 kg 134.718 kg     Body mass index is 34.73 kg/m.  General:  Well nourished, well developed, in no acute distress HEENT: normal Lymph: no adenopathy Neck: no JVD Endocrine:  No thryomegaly Cardiac:  normal S1, S2; irreg irreg;  harsh 3/6 SEM  Lungs:  decreased breath sounds at bases  Abd: soft, nontender, no hepatomegaly  Ext: no edema Musculoskeletal:  No deformities, BUE and BLE strength normal and equal Skin: warm and dry  Neuro:  CNs 2-12 intact, no focal abnormalities noted Psych:  Normal affect   EKG:  The EKG was personally reviewed and demonstrates:  afib with HR 111 bpm Telemetry:  Telemetry was personally reviewed and demonstrates:  afib with CVR  Relevant CV Studies:  Echo 01/19/14 Interpretation Summary  A complete two-dimensional transthoracic echocardiogram was performed (2D,  M-mode, Doppler and color flow Doppler). The left ventricle is normal in  size.  There is mild concentric left ventricular hypertrophy with normal wall  motion and ejection fraction 65-70%.  The aortic valve is  trileaflet.  Moderate aortic sclerosis is present with good valvular opening.  The left atrium is mildly dilated.  The mitral valve leaflets appear calcified, but not stenotic.  Unable to adequately determine diastolic dysfunction.  There is mild (1+) tricuspid regurgitation.  There is moderate (2+) mitral regurgitation.     ______________________  Echo 11/02/21 IMPRESSIONS  1. The aortic valve is incompletely visualized on SAX view, however,  suspect severe (vs moderate-to-severe) paradoxical low flow, low gradient  aortic stenosis with AVA 0.8cm2 by continuity, mean gradient , Vmax  3.5m/s, DI 0.23, SVi 22. Notably the   LVOT VTI may be underestimated due to suboptimal doppler angle.   2. Left ventricular ejection fraction, by estimation, is 55 to 60%. The  left ventricle has normal function. The left ventricle has no regional  wall motion abnormalities. There is mild asymmetric left ventricular  hypertrophy of the basal-septal segment.  Left ventricular diastolic parameters are consistent with Grade II  diastolic dysfunction (pseudonormalization).   3. Right ventricular systolic function is mildly reduced. The right  ventricular size is normal. There is mildly elevated pulmonary artery  systolic pressure.   4. Left atrial size was mildly dilated.   5. The mitral valve is degenerative. Mild mitral valve regurgitation.  Moderate mitral annular calcification.   6. The aortic valve is tricuspid. There is moderate calcification of the  aortic valve. There is moderate thickening of the aortic valve. Aortic  valve regurgitation is mild. Severe aortic valve stenosis.   7. Aortic dilatation noted. There is moderate dilatation of the ascending  aorta, measuring 44 mm.   8. The inferior vena cava is dilated in size with <50% respiratory  variability, suggesting right atrial pressure of 15 mmHg.   Comparison(s): No prior Echocardiogram.   Laboratory Data:  High Sensitivity  Troponin:   Recent Labs  Lab 11/01/21 1422 11/01/21 1653  TROPONINIHS 23* 25*     Chemistry Recent Labs  Lab 11/01/21 1155 11/01/21 1448 11/02/21 0134 11/03/21 0111  NA 133* 135 132* 132*  K 3.5 3.7 4.2 3.9  CL 97*  --  99 100  CO2 27  --  26 24  GLUCOSE 130*  --  134* 111*  BUN 11  --  14 18  CREATININE 0.88  --  0.76 0.89  CALCIUM 8.7*  --  8.7* 8.5*  GFRNONAA >60  --  >60 >60  ANIONGAP 9  --  7 8    Recent Labs  Lab 11/01/21 1155  PROT 6.8  ALBUMIN 3.7  AST 32  ALT 28  ALKPHOS 104  BILITOT 2.4*   Hematology Recent Labs  Lab 11/01/21 1155 11/01/21 1448 11/02/21 0134 11/03/21 0111  WBC 6.0  --  4.9 8.8  RBC 4.29  --  4.00* 4.12*  HGB 13.5 14.3 12.5* 13.0  HCT 41.0 42.0 37.6* 39.2  MCV 95.6  --  94.0 95.1  MCH 31.5  --  31.3 31.6  MCHC 32.9  --  33.2 33.2  RDW 14.1  --  13.8 14.0  PLT 212  --  218 197   BNP Recent Labs  Lab 11/01/21 1157  BNP 264.0*    DDimer No results for input(s): DDIMER in the last 168 hours.   Radiology/Studies:  DG Chest Port 1 View  Result Date: 11/01/2021 CLINICAL DATA:  Shortness of breath EXAM: PORTABLE CHEST 1 VIEW COMPARISON:  None. FINDINGS: Transverse diameter of heart is increased. Central pulmonary vessels are prominent. Interstitial markings in the parahilar regions and lower lung fields are prominent. There is no focal pulmonary consolidation. Left lateral CP angle is not included in its entirety. There is no pneumothorax. Degenerative changes are noted in the right shoulder. IMPRESSION: Cardiomegaly. There is prominence of interstitial markings in the parahilar regions and lower lung fields suggesting mild interstitial edema or interstitial pneumonia. There is no focal pulmonary consolidation. There is no pleural effusion. Electronically Signed   By: Elmer Picker M.D.   On: 11/01/2021 12:04   ECHOCARDIOGRAM COMPLETE  Result Date: 11/02/2021    ECHOCARDIOGRAM REPORT   Patient Name:   JAYREN HEWETT Date of Exam:  11/02/2021 Medical Rec #:  [id number]    Height:       77.0 in Accession #:    [id number]   Weight:       296.7 lb Date of Birth:  Jan 22, 1927    BSA:          2.645 m Patient Age:    46 years     BP:           134/90 mmHg Patient Gender: M            HR:           74 bpm. Exam Location:  Inpatient Procedure: 2D Echo, Cardiac Doppler and Color Doppler Indications:    CHF-Acute Diastolic [id number]  History:        Patient has no prior history of Echocardiogram examinations.  Sonographer:    Bernadene Person RDCS Referring Phys: [id number] [place]  1. The aortic valve is incompletely visualized on SAX view, however, suspect severe (vs moderate-to-severe) paradoxical low flow, low gradient aortic stenosis with AVA 0.8cm2 by continuity, mean gradient 49mmHg, Vmax 3.35m/s, DI 0.23, SVi 22. Notably the  LVOT VTI may be underestimated due to suboptimal doppler angle.  2. Left ventricular ejection fraction, by estimation, is 55 to 60%. The left ventricle has normal function. The left ventricle has no regional wall motion abnormalities. There is mild asymmetric left ventricular hypertrophy of the basal-septal segment. Left ventricular diastolic parameters are consistent with Grade II diastolic dysfunction (pseudonormalization).  3. Right ventricular systolic function is mildly reduced. The right ventricular size is normal. There is mildly elevated pulmonary artery  systolic pressure.  4. Left atrial size was mildly dilated.  5. The mitral valve is degenerative. Mild mitral valve regurgitation. Moderate mitral annular calcification.  6. The aortic valve is tricuspid. There is moderate calcification of the aortic valve. There is moderate thickening of the aortic valve. Aortic valve regurgitation is mild. Severe aortic valve stenosis.  7. Aortic dilatation noted. There is moderate dilatation of the ascending aorta, measuring 44 mm.  8. The inferior vena cava is dilated in size with <50% respiratory variability, suggesting  right atrial pressure of 15 mmHg. Comparison(s): No prior Echocardiogram. FINDINGS  Left Ventricle: Left ventricular ejection fraction, by estimation, is 55 to 60%. The left ventricle has normal function. The left ventricle has no regional wall motion abnormalities. The left ventricular internal cavity size was normal in size. There is  mild asymmetric left ventricular hypertrophy of the basal-septal segment. Left ventricular diastolic parameters are consistent with Grade II diastolic dysfunction (pseudonormalization). Right Ventricle: The right ventricular size is normal. Right vetricular wall thickness was not well visualized. Right ventricular systolic function is mildly reduced. There is mildly elevated pulmonary artery systolic pressure. The tricuspid regurgitant velocity is 2.69 m/s, and with an assumed right atrial pressure of 15 mmHg, the estimated right ventricular systolic pressure is [id number] mmHg. Left Atrium: Left atrial size was mildly dilated. Right Atrium: Right atrial size was normal in size. Pericardium: There is no evidence of pericardial effusion. Mitral Valve: The mitral valve is degenerative in appearance. There is mild thickening of the mitral valve leaflet(s). There is mild calcification of the mitral valve leaflet(s). Moderate mitral annular calcification. Mild mitral valve regurgitation. Tricuspid Valve: The tricuspid valve is normal in structure. Tricuspid valve regurgitation is mild. Aortic Valve: The aortic valve is tricuspid. There is moderate calcification of the aortic valve. There is moderate thickening of the aortic valve. Aortic valve regurgitation is mild. Aortic regurgitation PHT measures 500 msec. Severe aortic stenosis is present. Aortic valve mean gradient measures 22.0 mmHg. Aortic valve peak gradient measures 32.8 mmHg. Aortic valve area, by VTI measures 0.83 cm. Pulmonic Valve: The pulmonic valve was not well visualized. Pulmonic valve regurgitation is trivial. Aorta: The  aortic root is normal in size and structure and aortic dilatation noted. There is moderate dilatation of the ascending aorta, measuring 44 mm. Venous: The inferior vena cava is dilated in size with less than 50% respiratory variability, suggesting right atrial pressure of 15 mmHg. IAS/Shunts: The atrial septum is grossly normal.  LEFT VENTRICLE PLAX 2D LVIDd:         4.70 cm      Diastology LVIDs:         3.10 cm      LV e' medial:    4.60 cm/s LV PW:         1.00 cm      LV E/e' medial:  33.0 LV IVS:        0.90 cm      LV e' lateral:   6.05 cm/s LVOT diam:     2.10 cm      LV E/e' lateral: 25.1 LV SV:         58 LV SV Index:   22 LVOT Area:     3.46 cm  LV Volumes (MOD) LV vol d, MOD A2C: 68.4 ml LV vol d, MOD A4C: 127.0 ml LV vol s, MOD A2C: 29.4 ml LV vol s, MOD A4C: 55.2 ml LV SV MOD A2C:     39.0 ml LV SV  MOD A4C:     127.0 ml LV SV MOD BP:      53.0 ml RIGHT VENTRICLE RV S prime:     7.76 cm/s TAPSE (M-mode): 1.7 cm LEFT ATRIUM             Index        RIGHT ATRIUM           Index LA diam:        4.40 cm 1.66 cm/m   RA Area:     17.70 cm LA Vol (A2C):   87.4 ml 33.04 ml/m  RA Volume:   45.00 ml  17.01 ml/m LA Vol (A4C):   95.4 ml 36.07 ml/m LA Biplane Vol: 93.0 ml 35.16 ml/m  AORTIC VALVE AV Area (Vmax):    0.93 cm AV Area (Vmean):   0.83 cm AV Area (VTI):     0.83 cm AV Vmax:           286.33 cm/s AV Vmean:          222.667 cm/s AV VTI:            0.702 m AV Peak Grad:      32.8 mmHg AV Mean Grad:      22.0 mmHg LVOT Vmax:         77.17 cm/s LVOT Vmean:        53.333 cm/s LVOT VTI:          0.168 m LVOT/AV VTI ratio: 0.24 AI PHT:            500 msec  AORTA Ao Root diam: 3.50 cm Ao Asc diam:  4.40 cm MITRAL VALVE                TRICUSPID VALVE MV Area (PHT): 3.77 cm     TR Peak grad:   28.9 mmHg MV Decel Time: 201 msec     TR Vmax:        269.00 cm/s MR Peak grad: 112.8 mmHg MR Mean grad: 73.0 mmHg     SHUNTS MR Vmax:      531.00 cm/s   Systemic VTI:  0.17 m MR Vmean:     411.0 cm/s    Systemic  Diam: 2.10 cm MV E velocity: 152.00 cm/s MV A velocity: 85.00 cm/s MV E/A ratio:  1.79 Gwyndolyn Kaufman MD Electronically signed by Gwyndolyn Kaufman MD Signature Date/Time: 11/02/2021/4:50:05 PM    Final      [facility] Cardiomyopathy Questionnaire  KCCQ-12 11/03/2021  1 a. Ability to shower/bathe Moderately limited  1 b. Ability to walk 1 block Extremely limited  1 c. Ability to hurry/jog Extremely limited  2. Edema feet/ankles/legs Never over the past 2 weeks  3. Limited by fatigue All of the time  4. Limited by dyspnea All of the time  5. Sitting up / on 3+ pillows Every night  6. Limited enjoyment of life Extremely limited  7. Rest of life w/ symptoms Not at all satisfied  8 a. Participation in hobbies Severely limited  8 b. Participation in chores Severely limited  8 c. Visiting family/friends Severely limited       STS Risk Calculator: Procedure: AV Replacement  Risk of Mortality: 4.013% Renal Failure: 4.002% Permanent Stroke: 2.124% Prolonged Ventilation: 12.162% DSW Infection: 0.137% Reoperation: 4.221% Morbidity or Mortality: 19.886% Short Length of Stay: 14.098% Long Length of Stay: 11.598%  Assessment and Plan:   Theodies Rhein is a 88 y.o. male with symptoms of severe, stage D3  aortic stenosis with NYHA Class IV symptoms who is currently admitted for acute on chronic diastolic CHF. I have reviewed the patient's recent echocardiogram which is notable for preserved LV systolic function and severe paradoxical LFLG aortic stenosis with peak gradient of 32.8 mm hg and mean transvalvular gradient of 22 mm hg. The patient's dimensionless index is 0.24, stroke volume index 33 and calculated aortic valve area is 0.83 cm.   I have reviewed the natural history of aortic stenosis with the patient. We have discussed the limitations of medical therapy and the poor prognosis associated with symptomatic aortic stenosis. We have reviewed potential treatment options, including  palliative medical therapy, conventional surgical aortic valve replacement, and transcatheter aortic valve replacement. We discussed treatment options in the context of this patient's specific comorbid medical conditions.    The patient's predicted risk of mortality with conventional aortic valve replacement is 4.013% primarily based on age, acute CHF, HTN, and afib. TAVR seems like a reasonable treatment option for this patient pending formal cardiac surgical consultation. We discussed typical evaluation which will require L/RHC and a gated cardiac CTA and a CTA of the chest/abdomen/pelvis to evaluate both his cardiac anatomy and peripheral vasculature. Follow-up testing will be arranged as an inpatient and outpatient.   The patient is now amendable to oral anticoagulation for his atrial fibrillation. I think we should proceed with [facility]/RHC while admitted. This has been set up for tomorrow at 1:30pm with Dr. Excell Seltzerooper. Then he can be started on Eliquis. If he remains inpatient, we can try to get his TAVR scans if renal function remains stable. If not, we can allow for discharge home with outpatient CT scans. He has an apt with Dr. Laneta SimmersBartle on 1/23 @ 3pm.   I have reviewed the risks, indications, and alternatives to cardiac catheterization and possible angioplasty/stenting with the patient. Risks include but are not limited to bleeding, infection, vascular injury, stroke, myocardial infection, arrhythmia, kidney injury, radiation-related injury in the case of prolonged fluoroscopy use, emergency cardiac surgery, and death. The patient understands the risks of serious complication is low (<1%).    For questions or updates, please contact [facility] Please consult [email or web address] for contact info under    Signed, Cline CrockKathryn Burna Atlas, PA-C  11/03/2021 4:17 PM   I have personally seen and examined this patient. I agree with the assessment and plan as outlined above.  88 yo male with paroxysmal atrial  fibrillation, mitral valve regurgitation and now finding of severe aortic stenosis who we are asked to see today to discuss his aortic stenosis and possible TAVR.  He is a pleasant, active elderly male. He is a former Pharmacist, hospitalcollege athlete. He was found to have atrial fibrillation in 2014 during routine pre-op testing for cataract surgery. He refused anti-coagulation. He has not been followed by cardiology. Last echo in 2015 with LVEF=65-70%, aortic valve sclerosis and moderate mitral and tricuspid regurgitation.  He had been in usual state of good health until the last month. He has been noticing exercise intolerance, dyspnea on exertion, orthopnea and PND. [facility] with worsened dyspnea and was found to have acute hypoxic respiratory failure. He was volume overloaded and has been diuresed with IV Lasix with rapid improvement in his symptoms. EKG with rapid atrial fibrillation.  Echo from 11/02/21 is reviewed by me personally and shows normal LV systolic function with thickened and calcified aortic valve with likely severe low flow/low gradient aortic stenosis. (Mean gradient 22 mmHg, DI 24, SVI 33, AVA  0.83 cm2).  He tells me today that he feels much better. Dyspnea has resolved. No chest pain.  Labs reviewed by me.  EKG personally reviewed by me.  My exam: I agree with the above. Pt is a pleasant elderly male in NAD. Loud harsh systolic murmur. Lungs clear. Trace bilateral LE edema.   Plan: Severe low flow/low gradient aortic stenosis: He has severe, stage D2 aortic valve stenosis. I have personally reviewed the echo images. The aortic valve is thickened, calcified with limited leaflet mobility. I think he would benefit from AVR. Given advanced age, he is not a good candidate for conventional AVR by surgical approach. I think he may be a good candidate for TAVR.   I have reviewed the natural history of aortic stenosis with the patient and their family members  who are present today. We  have discussed the limitations of medical therapy and the poor prognosis associated with symptomatic aortic stenosis. We have reviewed potential treatment options, including palliative medical therapy, conventional surgical aortic valve replacement, and transcatheter aortic valve replacement. We discussed treatment options in the context of the patient's specific comorbid medical conditions.   He would like to proceed with planning for TAVR. We will plan a right and left heart catheterization today and will arrange his pre-TAVR CT scans either before discharge or as an outpatient.  We will arrange outpatient follow up with Dr. Cyndia Bent with CT surgery.    Lauree Chandler 11/04/2021 10:05 AM

## 2021-11-03 NOTE — Progress Notes (Addendum)
PROGRESS NOTE    Louis Koch  [id number] DOB: 03-06-1927 DOA: 11/01/2021 PCP: Pcp, No  Brief Narrative: 89/M with history of paroxysmal atrial fibrillation, not on anticoagulation, moderate aortic stenosis, moderate MR presented to the ED with shortness of breath.  Patient has not seen a medical doctor in over 5+ years, reports progressive dyspnea on exertion for 1 month, significantly worse in the last 3 days with orthopnea.  Subsequently EMS was called, upon arrival O2 sats were in the 70s with wheezing and respiratory distress, he was placed on a nonrebreather, given nebulizations and brought to the ED. -In the emergency room he was noted to be in distress, chest x-ray noted cardiomegaly and interstitial edema   Assessment & Plan:  Acute hypoxic respiratory failure Acute diastolic CHF Severe aortic stenosis -Volume status is improving, continue IV Lasix 1 more day -2D echo noted preserved EF, grade 2 diastolic dysfunction, severe aortic stenosis  -Will request cardiology input, maybe a good TAVR candidate -Ambulate, wean off O2  A. fib RVR -Continue low-dose metoprolol -Remains on IV heparin at this time, previously declined anticoagulation, now agreeable, await cardiology input to determine need for further work-up before transitioning to Eliquis  Hyponatremia -Secondary to hypovolemic state, monitor with diuresis  DVT prophylaxis: IV heparin Code Status: Full code Family Communication: Discussed with patient and son at bedside Disposition Plan: Home pending above work-up Status is: Inpatient  Remains inpatient appropriate because: Severity of illness  Consultants:    Procedures:   Antimicrobials:    Subjective: -Feels better, breathing is improving, mild orthopnea still reported  Objective: Vitals:   11/02/21 1918 11/02/21 2021 11/03/21 0416 11/03/21 0816  BP: 134/82  (!) 163/99 (!) 131/96  Pulse:   86 87  Resp: 20  20   Temp: 97.8 F (36.6 C)  98.2 F  (36.8 C)   TempSrc: Oral  Oral   SpO2: 96% 95%  99%  Weight:   132.9 kg   Height:        Intake/Output Summary (Last 24 hours) at 11/03/2021 1023 Last data filed at 11/03/2021 [id number] Gross per 24 hour  Intake 1079.44 ml  Output 1700 ml  Net -620.56 ml   Filed Weights   11/01/21 2245 11/02/21 0413 11/03/21 0416  Weight: 134.7 kg 134.6 kg 132.9 kg    Examination:  General exam: Pleasant elderly male sitting up in bed, AAOx3, nondistressed HEENT: Positive JVD CVS: S1-S2, regular rhythm, systolic murmur Lungs: Few basilar rales Abdomen: Soft, obese, nontender, bowel sounds present Extremities: No edema Skin: No rash on exposed skin Psych: Appropriate mood and affect   Data Reviewed:   CBC: Recent Labs  Lab 11/01/21 1155 11/01/21 1448 11/02/21 0134 11/03/21 0111  WBC 6.0  --  4.9 8.8  HGB 13.5 14.3 12.5* 13.0  HCT 41.0 42.0 37.6* 39.2  MCV 95.6  --  94.0 95.1  PLT 212  --  218 [id number]   Basic Metabolic Panel: Recent Labs  Lab 11/01/21 1155 11/01/21 1448 11/02/21 0134 11/03/21 0111  NA 133* 135 132* 132*  K 3.5 3.7 4.2 3.9  CL 97*  --  99 100  CO2 27  --  26 24  GLUCOSE 130*  --  134* 111*  BUN 11  --  14 18  CREATININE 0.88  --  0.76 0.89  CALCIUM 8.7*  --  8.7* 8.5*   GFR: Estimated Creatinine Clearance: 79.9 mL/min (by C-G formula based on SCr of 0.89 mg/dL). Liver Function Tests: Recent Labs  Lab  11/01/21 1155  AST 32  ALT 28  ALKPHOS 104  BILITOT 2.4*  PROT 6.8  ALBUMIN 3.7   No results for input(s): LIPASE, AMYLASE in the last 168 hours. No results for input(s): AMMONIA in the last 168 hours. Coagulation Profile: No results for input(s): INR, PROTIME in the last 168 hours. Cardiac Enzymes: No results for input(s): CKTOTAL, CKMB, CKMBINDEX, TROPONINI in the last 168 hours. BNP (last 3 results) No results for input(s): PROBNP in the last 8760 hours. HbA1C: No results for input(s): HGBA1C in the last 72 hours. CBG: No results for input(s):  GLUCAP in the last 168 hours. Lipid Profile: No results for input(s): CHOL, HDL, LDLCALC, TRIG, CHOLHDL, LDLDIRECT in the last 72 hours. Thyroid Function Tests: No results for input(s): TSH, T4TOTAL, FREET4, T3FREE, THYROIDAB in the last 72 hours. Anemia Panel: No results for input(s): VITAMINB12, FOLATE, FERRITIN, TIBC, IRON, RETICCTPCT in the last 72 hours. Urine analysis: No results found for: COLORURINE, APPEARANCEUR, LABSPEC, PHURINE, GLUCOSEU, HGBUR, BILIRUBINUR, KETONESUR, PROTEINUR, UROBILINOGEN, NITRITE, LEUKOCYTESUR Sepsis Labs: @LABRCNTIP (procalcitonin:4,lacticidven:4)  ) Recent Results (from the past 240 hour(s))  Resp Panel by RT-PCR (Flu A&B, Covid) Nasopharyngeal Swab     Status: None   Collection Time: 11/01/21 11:30 AM   Specimen: Nasopharyngeal Swab; Nasopharyngeal(NP) swabs in vial transport medium  Result Value Ref Range Status   SARS Coronavirus 2 by RT PCR NEGATIVE NEGATIVE Final    Comment: (NOTE) SARS-CoV-2 target nucleic acids are NOT DETECTED.  The SARS-CoV-2 RNA is generally detectable in upper respiratory specimens during the acute phase of infection. The lowest concentration of SARS-CoV-2 viral copies this assay can detect is 138 copies/mL. A negative result does not preclude SARS-Cov-2 infection and should not be used as the sole basis for treatment or other patient management decisions. A negative result may occur with  improper specimen collection/handling, submission of specimen other than nasopharyngeal swab, presence of viral mutation(s) within the areas targeted by this assay, and inadequate number of viral copies(<138 copies/mL). A negative result must be combined with clinical observations, patient history, and epidemiological information. The expected result is Negative.  Fact Sheet for Patients:  EntrepreneurPulse.com.au  Fact Sheet for Healthcare Providers:  IncredibleEmployment.be  This test is no t  yet approved or cleared by the [place] FDA and  has been authorized for detection and/or diagnosis of SARS-CoV-2 by FDA under an Emergency Use Authorization (EUA). This EUA will remain  in effect (meaning this test can be used) for the duration of the COVID-19 declaration under Section 564(b)(1) of the Act, 21 U.S.C.section 360bbb-3(b)(1), unless the authorization is terminated  or revoked sooner.       Influenza A by PCR NEGATIVE NEGATIVE Final   Influenza B by PCR NEGATIVE NEGATIVE Final    Comment: (NOTE) The Xpert Xpress SARS-CoV-2/FLU/RSV plus assay is intended as an aid in the diagnosis of influenza from Nasopharyngeal swab specimens and should not be used as a sole basis for treatment. Nasal washings and aspirates are unacceptable for Xpert Xpress SARS-CoV-2/FLU/RSV testing.  Fact Sheet for Patients: EntrepreneurPulse.com.au  Fact Sheet for Healthcare Providers: IncredibleEmployment.be  This test is not yet approved or cleared by the [place] FDA and has been authorized for detection and/or diagnosis of SARS-CoV-2 by FDA under an Emergency Use Authorization (EUA). This EUA will remain in effect (meaning this test can be used) for the duration of the COVID-19 declaration under Section 564(b)(1) of the Act, 21 U.S.C. section 360bbb-3(b)(1), unless the authorization is terminated or revoked.  Performed at [facility], [place] [address] [place], [place] [address]          Radiology Studies: DG Chest Port 1 View  Result Date: 11/01/2021 CLINICAL DATA:  Shortness of breath EXAM: PORTABLE CHEST 1 VIEW COMPARISON:  None. FINDINGS: Transverse diameter of heart is increased. Central pulmonary vessels are prominent. Interstitial markings in the parahilar regions and lower lung fields are prominent. There is no focal pulmonary consolidation. Left lateral CP angle is not included in its entirety. There is no pneumothorax.  Degenerative changes are noted in the right shoulder. IMPRESSION: Cardiomegaly. There is prominence of interstitial markings in the parahilar regions and lower lung fields suggesting mild interstitial edema or interstitial pneumonia. There is no focal pulmonary consolidation. There is no pleural effusion. Electronically Signed   By: Elmer Picker M.D.   On: 11/01/2021 12:04   ECHOCARDIOGRAM COMPLETE  Result Date: 11/02/2021    ECHOCARDIOGRAM REPORT   Patient Name:   DAIGAN MUDD Date of Exam: 11/02/2021 Medical Rec #:  [id number]    Height:       77.0 in Accession #:    [id number]   Weight:       296.7 lb Date of Birth:  1927/06/06    BSA:          2.645 m Patient Age:    55 years     BP:           134/90 mmHg Patient Gender: M            HR:           74 bpm. Exam Location:  Inpatient Procedure: 2D Echo, Cardiac Doppler and Color Doppler Indications:    CHF-Acute Diastolic [id number]  History:        Patient has no prior history of Echocardiogram examinations.  Sonographer:    Bernadene Person RDCS Referring Phys: [id number] [place]  1. The aortic valve is incompletely visualized on SAX view, however, suspect severe (vs moderate-to-severe) paradoxical low flow, low gradient aortic stenosis with AVA 0.8cm2 by continuity, mean gradient 63mmHg, Vmax 3.42m/s, DI 0.23, SVi 22. Notably the  LVOT VTI may be underestimated due to suboptimal doppler angle.  2. Left ventricular ejection fraction, by estimation, is 55 to 60%. The left ventricle has normal function. The left ventricle has no regional wall motion abnormalities. There is mild asymmetric left ventricular hypertrophy of the basal-septal segment. Left ventricular diastolic parameters are consistent with Grade II diastolic dysfunction (pseudonormalization).  3. Right ventricular systolic function is mildly reduced. The right ventricular size is normal. There is mildly elevated pulmonary artery systolic pressure.  4. Left atrial size was mildly dilated.   5. The mitral valve is degenerative. Mild mitral valve regurgitation. Moderate mitral annular calcification.  6. The aortic valve is tricuspid. There is moderate calcification of the aortic valve. There is moderate thickening of the aortic valve. Aortic valve regurgitation is mild. Severe aortic valve stenosis.  7. Aortic dilatation noted. There is moderate dilatation of the ascending aorta, measuring 44 mm.  8. The inferior vena cava is dilated in size with <50% respiratory variability, suggesting right atrial pressure of 15 mmHg. Comparison(s): No prior Echocardiogram. FINDINGS  Left Ventricle: Left ventricular ejection fraction, by estimation, is 55 to 60%. The left ventricle has normal function. The left ventricle has no regional wall motion abnormalities. The left ventricular internal cavity size was normal in size. There is  mild asymmetric left ventricular hypertrophy of the basal-septal segment.  Left ventricular diastolic parameters are consistent with Grade II diastolic dysfunction (pseudonormalization). Right Ventricle: The right ventricular size is normal. Right vetricular wall thickness was not well visualized. Right ventricular systolic function is mildly reduced. There is mildly elevated pulmonary artery systolic pressure. The tricuspid regurgitant velocity is 2.69 m/s, and with an assumed right atrial pressure of 15 mmHg, the estimated right ventricular systolic pressure is 43.9 mmHg. Left Atrium: Left atrial size was mildly dilated. Right Atrium: Right atrial size was normal in size. Pericardium: There is no evidence of pericardial effusion. Mitral Valve: The mitral valve is degenerative in appearance. There is mild thickening of the mitral valve leaflet(s). There is mild calcification of the mitral valve leaflet(s). Moderate mitral annular calcification. Mild mitral valve regurgitation. Tricuspid Valve: The tricuspid valve is normal in structure. Tricuspid valve regurgitation is mild. Aortic Valve:  The aortic valve is tricuspid. There is moderate calcification of the aortic valve. There is moderate thickening of the aortic valve. Aortic valve regurgitation is mild. Aortic regurgitation PHT measures 500 msec. Severe aortic stenosis is present. Aortic valve mean gradient measures 22.0 mmHg. Aortic valve peak gradient measures 32.8 mmHg. Aortic valve area, by VTI measures 0.83 cm. Pulmonic Valve: The pulmonic valve was not well visualized. Pulmonic valve regurgitation is trivial. Aorta: The aortic root is normal in size and structure and aortic dilatation noted. There is moderate dilatation of the ascending aorta, measuring 44 mm. Venous: The inferior vena cava is dilated in size with less than 50% respiratory variability, suggesting right atrial pressure of 15 mmHg. IAS/Shunts: The atrial septum is grossly normal.  LEFT VENTRICLE PLAX 2D LVIDd:         4.70 cm      Diastology LVIDs:         3.10 cm      LV e' medial:    4.60 cm/s LV PW:         1.00 cm      LV E/e' medial:  33.0 LV IVS:        0.90 cm      LV e' lateral:   6.05 cm/s LVOT diam:     2.10 cm      LV E/e' lateral: 25.1 LV SV:         58 LV SV Index:   22 LVOT Area:     3.46 cm  LV Volumes (MOD) LV vol d, MOD A2C: 68.4 ml LV vol d, MOD A4C: 127.0 ml LV vol s, MOD A2C: 29.4 ml LV vol s, MOD A4C: 55.2 ml LV SV MOD A2C:     39.0 ml LV SV MOD A4C:     127.0 ml LV SV MOD BP:      53.0 ml RIGHT VENTRICLE RV S prime:     7.76 cm/s TAPSE (M-mode): 1.7 cm LEFT ATRIUM             Index        RIGHT ATRIUM           Index LA diam:        4.40 cm 1.66 cm/m   RA Area:     17.70 cm LA Vol (A2C):   87.4 ml 33.04 ml/m  RA Volume:   45.00 ml  17.01 ml/m LA Vol (A4C):   95.4 ml 36.07 ml/m LA Biplane Vol: 93.0 ml 35.16 ml/m  AORTIC VALVE AV Area (Vmax):    0.93 cm AV Area (Vmean):   0.83 cm AV Area (VTI):  0.83 cm AV Vmax:           286.33 cm/s AV Vmean:          222.667 cm/s AV VTI:            0.702 m AV Peak Grad:      32.8 mmHg AV Mean Grad:      22.0  mmHg LVOT Vmax:         77.17 cm/s LVOT Vmean:        53.333 cm/s LVOT VTI:          0.168 m LVOT/AV VTI ratio: 0.24 AI PHT:            500 msec  AORTA Ao Root diam: 3.50 cm Ao Asc diam:  4.40 cm MITRAL VALVE                TRICUSPID VALVE MV Area (PHT): 3.77 cm     TR Peak grad:   28.9 mmHg MV Decel Time: 201 msec     TR Vmax:        269.00 cm/s MR Peak grad: 112.8 mmHg MR Mean grad: 73.0 mmHg     SHUNTS MR Vmax:      531.00 cm/s   Systemic VTI:  0.17 m MR Vmean:     411.0 cm/s    Systemic Diam: 2.10 cm MV E velocity: 152.00 cm/s MV A velocity: 85.00 cm/s MV E/A ratio:  1.79 Gwyndolyn Kaufman MD Electronically signed by Gwyndolyn Kaufman MD Signature Date/Time: 11/02/2021/4:50:05 PM    Final      Scheduled Meds:  furosemide  20 mg Intravenous BID   guaiFENesin  1,200 mg Oral BID   ipratropium-albuterol  3 mL Nebulization TID   metoprolol tartrate  12.5 mg Oral BID   polyethylene glycol  17 g Oral Daily   sodium chloride flush  3 mL Intravenous Q12H   Continuous Infusions:  sodium chloride     heparin 1,500 Units/hr (11/02/21 2032)     LOS: 2 days    Time spent: 57min    Domenic Polite, MD [facility]   11/03/2021, 10:23 AM

## 2021-11-03 NOTE — Progress Notes (Signed)
Heart Failure Navigator Progress Note  Assessed for Heart & Vascular TOC clinic readiness.  Patient does not meet criteria due to severe AS.   Navigator available for reassessment of patient.   Pricilla Holm, MSN, RN Heart Failure Nurse Navigator [phone]

## 2021-11-03 NOTE — Progress Notes (Signed)
Mobility Specialist Progress Note    11/03/21 1426  Mobility  Activity Ambulated in hall  Level of Assistance Contact guard assist, steadying assist  Assistive Device  (hallway railings)  Distance Ambulated (ft) 100 ft  Mobility Ambulated with assistance in hallway  Mobility Response Tolerated fair  Mobility performed by Mobility specialist  $Mobility charge 1 Mobility   Pt received in chair and agreeable. Ambulated on 4LO2. Returned to sitting EOB with a medical team member present.  [facility] Mobility Specialist  M.S. Primary Phone: [phone] M.S. Secondary Phone: [phone]

## 2021-11-03 NOTE — H&P (View-Only) (Signed)
[place] VALVE TEAM  Cardiology Consultation:   Patient ID: Kamerin Manwiller MRN: [id number]; DOB: [birth year]  Admit date: 11/01/2021 Date of Consult: 11/03/2021  Primary Care Provider: Merryl Hacker No [facility] Cardiologist: Buford Dresser, MD (new) [place] Electrophysiologist:  None    Patient Profile:   Kyson Cichowski is a 88 y.o. male with a hx of prosthetic knee infection (2015), aortic sclerosis, moderate MR, PAF (not on [place] due to patient refusal) who is being seen today for the evaluation of acute CHF and paradoxical LFLG AS at the request of Dr. Harrell Gave.  History of Present Illness:   Mr. Brokenshire lives with his male companion in a condominium in [place], [place]. He has 4 sons and multiple grandchildren/great grandchildren with whom he is very close. 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 He continues to work everyday for a family owned [employer] with his son, Jenny Reichmann. 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 He has been active all his life and still exercises everyday doing pushups, walking and free weights. He lives totally independently and takes care of all of his own ADLs. He has no significant memory impairment. He has had regular dental care and no active issues.   The patient reports having had a heart murmur his whole life. He has been very healthy and it never caused him any limitation. His cardiac history dates back to 2014 when he was incidentally found to be in new onset atrial fibrillation during a preoperative evaluation for cataract surgery. He was started on metoprolol, but patient refused anticoagulation. He later self discontinued metoprolol. Patient has not followed up with cardiology since then. Per review of CareEverywhere, most recent echo 01/19/14 at [facility] showed EF 65-70%,  moderate aortic sclerosis, moderate MR/ mild TR. Patient has not been seeing any medical doctor for the last 5 years.   He was in his usual state of health until ~ 1 month ago when he pulled a groin muscle causing him to "slow down." Since that time, he has noticed worsening exercise intolerance, dyspnea on exertion, orthopnea and PND. He had "panic attacks" where he would have pursed lipped breathing and panting.   The patient finally presented to the ER for evaluation via EMS on 1/3 for evaluation of shortness of breath and found to have acute hypoxic respiratory failure. Per EMS, patient was found in atrial fibrillation with RVR, hypertensive, and had 02 sats in the 60s. Patient was lethargic and confused. Labs in the ED showed Na 133, K 3.5, BUN 11, creatinine 0.88, WBC 6.0, hemoglobin 13.5, hematocrit, 41.0. Viral respiratory panel negative. BNP was elevated to 264.0. HSTN 23>>25. Venous blood gas showed pO2<31.0. Chest x-ray showed cardiomegaly, prominence of interstitial markings suggesting mild interstitial edema or interstitial pneumonia. EKG showed atrial fibrillation with a rate of 111. Echo from 11/02/21 suggested severe (vs moderate-to-severe) paradoxical low flow, low gradient aortic stenosis: mean gradient 22 mm hg, peak 32.8 mmhg, AVA 0.83 cm2, DVI 24, SVI 22. EF 55-60%, normal LV wall motion, mild LV hypertrophy, G2DD, mild LAE, mild MR. There is moderate dilatation of the ascending aorta, measuring 44 mm.   He was started on IV lasix and IV heparin as well as Lopressor 12.5mg  BID. He has a good response to IV lasix and weight down 5 lbs. He is breathing much  better but not to his baseline. He denies CP, LE edema, dizziness or syncope. He is eager to start work up for TAVR and have his valve fixed.    Past Medical History:  Diagnosis Date   Chronic diastolic (congestive) heart failure (HCC)    Infected prosthetic knee joint, sequela    Mitral regurgitation    PAF (paroxysmal atrial  fibrillation) (HCC)    Severe aortic stenosis     Past Surgical History:  Procedure Laterality Date   KNEE SURGERY       Home Medications:  Prior to Admission medications   Medication Sig Start Date End Date Taking? Authorizing Provider  aspirin 500 MG EC tablet Take 500 mg by mouth every evening.   Yes [provider]  B Complex Vitamins (B COMPLEX PO) Take 1 tablet by mouth daily.   Yes [provider]  Cholecalciferol (D3 PO) Take 1 capsule by mouth daily.   Yes [provider]  Cyanocobalamin (B-12 PO) Take 1 tablet by mouth daily.   Yes [provider]  Multiple Vitamin (MULTIVITAMIN WITH MINERALS) TABS tablet Take 1 tablet by mouth daily.   Yes [provider]    Inpatient Medications: Scheduled Meds:  furosemide  20 mg Intravenous BID   guaiFENesin  1,200 mg Oral BID   ipratropium-albuterol  3 mL Nebulization TID   metoprolol tartrate  12.5 mg Oral BID   polyethylene glycol  17 g Oral Daily   sodium chloride flush  3 mL Intravenous Q12H   sodium chloride flush  3 mL Intravenous Q12H   Continuous Infusions:  sodium chloride     heparin 1,500 Units/hr (11/03/21 1316)   PRN Meds: sodium chloride, acetaminophen, ondansetron (ZOFRAN) IV, sodium chloride flush  Allergies:   No Known Allergies  Social History:   Social History   Socioeconomic History   Marital status: Unknown    Spouse name: Not on file   Number of children: Not on file   Years of education: Not on file   Highest education level: Not on file  Occupational History   Not on file  Tobacco Use   Smoking status: Never   Smokeless tobacco: Not on file  Substance and Sexual Activity   Alcohol use: Yes   Drug use: No   Sexual activity: Not on file  Other Topics Concern   Not on file  Social History Narrative   Not on file   Social Determinants of Health   Financial Resource Strain: Not on file  Food Insecurity: Not on file  Transportation Needs: Not  on file  Physical Activity: Not on file  Stress: Not on file  Social Connections: Not on file  Intimate Partner Violence: Not on file    Family History:   History reviewed. No pertinent family history.   ROS:  Please see the history of present illness.  All other ROS reviewed and negative.     Physical Exam/Data:   Vitals:   11/02/21 2021 11/03/21 0416 11/03/21 0816 11/03/21 1154  BP:  (!) 163/99 (!) 131/96 (!) 143/84  Pulse:  86 87 79  Resp:  20    Temp:  98.2 F (36.8 C)  (!) 97.4 F (36.3 C)  TempSrc:  Oral  Oral  SpO2: 95%  99% 100%  Weight:  132.9 kg    Height:        Intake/Output Summary (Last 24 hours) at 11/03/2021 1617 Last data filed at 11/03/2021 1515 Gross per 24 hour  Intake  1319.44 ml  Output 2250 ml  Net -930.56 ml   Last 3 Weights 11/03/2021 11/02/2021 11/01/2021  Weight (lbs) 292 lb 14.4 oz 296 lb 11.2 oz 297 lb  Weight (kg) 132.859 kg 134.582 kg 134.718 kg     Body mass index is 34.73 kg/m.  General:  Well nourished, well developed, in no acute distress HEENT: normal Lymph: no adenopathy Neck: no JVD Endocrine:  No thryomegaly Cardiac:  normal S1, S2; irreg irreg;  harsh 3/6 SEM  Lungs:  decreased breath sounds at bases  Abd: soft, nontender, no hepatomegaly  Ext: no edema Musculoskeletal:  No deformities, BUE and BLE strength normal and equal Skin: warm and dry  Neuro:  CNs 2-12 intact, no focal abnormalities noted Psych:  Normal affect   EKG:  The EKG was personally reviewed and demonstrates:  afib with HR 111 bpm Telemetry:  Telemetry was personally reviewed and demonstrates:  afib with CVR  Relevant CV Studies:  Echo 01/19/14 Interpretation Summary  A complete two-dimensional transthoracic echocardiogram was performed (2D,  M-mode, Doppler and color flow Doppler). The left ventricle is normal in  size.  There is mild concentric left ventricular hypertrophy with normal wall  motion and ejection fraction 65-70%.  The aortic valve is  trileaflet.  Moderate aortic sclerosis is present with good valvular opening.  The left atrium is mildly dilated.  The mitral valve leaflets appear calcified, but not stenotic.  Unable to adequately determine diastolic dysfunction.  There is mild (1+) tricuspid regurgitation.  There is moderate (2+) mitral regurgitation.     ______________________  Echo 11/02/21 IMPRESSIONS  1. The aortic valve is incompletely visualized on SAX view, however,  suspect severe (vs moderate-to-severe) paradoxical low flow, low gradient  aortic stenosis with AVA 0.8cm2 by continuity, mean gradient , Vmax  3.5m/s, DI 0.23, SVi 22. Notably the   LVOT VTI may be underestimated due to suboptimal doppler angle.   2. Left ventricular ejection fraction, by estimation, is 55 to 60%. The  left ventricle has normal function. The left ventricle has no regional  wall motion abnormalities. There is mild asymmetric left ventricular  hypertrophy of the basal-septal segment.  Left ventricular diastolic parameters are consistent with Grade II  diastolic dysfunction (pseudonormalization).   3. Right ventricular systolic function is mildly reduced. The right  ventricular size is normal. There is mildly elevated pulmonary artery  systolic pressure.   4. Left atrial size was mildly dilated.   5. The mitral valve is degenerative. Mild mitral valve regurgitation.  Moderate mitral annular calcification.   6. The aortic valve is tricuspid. There is moderate calcification of the  aortic valve. There is moderate thickening of the aortic valve. Aortic  valve regurgitation is mild. Severe aortic valve stenosis.   7. Aortic dilatation noted. There is moderate dilatation of the ascending  aorta, measuring 44 mm.   8. The inferior vena cava is dilated in size with <50% respiratory  variability, suggesting right atrial pressure of 15 mmHg.   Comparison(s): No prior Echocardiogram.   Laboratory Data:  High Sensitivity  Troponin:   Recent Labs  Lab 11/01/21 1422 11/01/21 1653  TROPONINIHS 23* 25*     Chemistry Recent Labs  Lab 11/01/21 1155 11/01/21 1448 11/02/21 0134 11/03/21 0111  NA 133* 135 132* 132*  K 3.5 3.7 4.2 3.9  CL 97*  --  99 100  CO2 27  --  26 24  GLUCOSE 130*  --  134* 111*  BUN 11  --  14 18  CREATININE 0.88  --  0.76 0.89  CALCIUM 8.7*  --  8.7* 8.5*  GFRNONAA >60  --  >60 >60  ANIONGAP 9  --  7 8    Recent Labs  Lab 11/01/21 1155  PROT 6.8  ALBUMIN 3.7  AST 32  ALT 28  ALKPHOS 104  BILITOT 2.4*   Hematology Recent Labs  Lab 11/01/21 1155 11/01/21 1448 11/02/21 0134 11/03/21 0111  WBC 6.0  --  4.9 8.8  RBC 4.29  --  4.00* 4.12*  HGB 13.5 14.3 12.5* 13.0  HCT 41.0 42.0 37.6* 39.2  MCV 95.6  --  94.0 95.1  MCH 31.5  --  31.3 31.6  MCHC 32.9  --  33.2 33.2  RDW 14.1  --  13.8 14.0  PLT 212  --  218 197   BNP Recent Labs  Lab 11/01/21 1157  BNP 264.0*    DDimer No results for input(s): DDIMER in the last 168 hours.   Radiology/Studies:  DG Chest Port 1 View  Result Date: 11/01/2021 CLINICAL DATA:  Shortness of breath EXAM: PORTABLE CHEST 1 VIEW COMPARISON:  None. FINDINGS: Transverse diameter of heart is increased. Central pulmonary vessels are prominent. Interstitial markings in the parahilar regions and lower lung fields are prominent. There is no focal pulmonary consolidation. Left lateral CP angle is not included in its entirety. There is no pneumothorax. Degenerative changes are noted in the right shoulder. IMPRESSION: Cardiomegaly. There is prominence of interstitial markings in the parahilar regions and lower lung fields suggesting mild interstitial edema or interstitial pneumonia. There is no focal pulmonary consolidation. There is no pleural effusion. Electronically Signed   By: Elmer Picker M.D.   On: 11/01/2021 12:04   ECHOCARDIOGRAM COMPLETE  Result Date: 11/02/2021    ECHOCARDIOGRAM REPORT   Patient Name:   JAYREN HEWETT Date of Exam:  11/02/2021 Medical Rec #:  [id number]    Height:       77.0 in Accession #:    [id number]   Weight:       296.7 lb Date of Birth:  Jan 22, 1927    BSA:          2.645 m Patient Age:    46 years     BP:           134/90 mmHg Patient Gender: M            HR:           74 bpm. Exam Location:  Inpatient Procedure: 2D Echo, Cardiac Doppler and Color Doppler Indications:    CHF-Acute Diastolic [id number]  History:        Patient has no prior history of Echocardiogram examinations.  Sonographer:    Bernadene Person RDCS Referring Phys: [id number] [place]  1. The aortic valve is incompletely visualized on SAX view, however, suspect severe (vs moderate-to-severe) paradoxical low flow, low gradient aortic stenosis with AVA 0.8cm2 by continuity, mean gradient 49mmHg, Vmax 3.35m/s, DI 0.23, SVi 22. Notably the  LVOT VTI may be underestimated due to suboptimal doppler angle.  2. Left ventricular ejection fraction, by estimation, is 55 to 60%. The left ventricle has normal function. The left ventricle has no regional wall motion abnormalities. There is mild asymmetric left ventricular hypertrophy of the basal-septal segment. Left ventricular diastolic parameters are consistent with Grade II diastolic dysfunction (pseudonormalization).  3. Right ventricular systolic function is mildly reduced. The right ventricular size is normal. There is mildly elevated pulmonary artery  systolic pressure.  4. Left atrial size was mildly dilated.  5. The mitral valve is degenerative. Mild mitral valve regurgitation. Moderate mitral annular calcification.  6. The aortic valve is tricuspid. There is moderate calcification of the aortic valve. There is moderate thickening of the aortic valve. Aortic valve regurgitation is mild. Severe aortic valve stenosis.  7. Aortic dilatation noted. There is moderate dilatation of the ascending aorta, measuring 44 mm.  8. The inferior vena cava is dilated in size with <50% respiratory variability, suggesting  right atrial pressure of 15 mmHg. Comparison(s): No prior Echocardiogram. FINDINGS  Left Ventricle: Left ventricular ejection fraction, by estimation, is 55 to 60%. The left ventricle has normal function. The left ventricle has no regional wall motion abnormalities. The left ventricular internal cavity size was normal in size. There is  mild asymmetric left ventricular hypertrophy of the basal-septal segment. Left ventricular diastolic parameters are consistent with Grade II diastolic dysfunction (pseudonormalization). Right Ventricle: The right ventricular size is normal. Right vetricular wall thickness was not well visualized. Right ventricular systolic function is mildly reduced. There is mildly elevated pulmonary artery systolic pressure. The tricuspid regurgitant velocity is 2.69 m/s, and with an assumed right atrial pressure of 15 mmHg, the estimated right ventricular systolic pressure is [id number] mmHg. Left Atrium: Left atrial size was mildly dilated. Right Atrium: Right atrial size was normal in size. Pericardium: There is no evidence of pericardial effusion. Mitral Valve: The mitral valve is degenerative in appearance. There is mild thickening of the mitral valve leaflet(s). There is mild calcification of the mitral valve leaflet(s). Moderate mitral annular calcification. Mild mitral valve regurgitation. Tricuspid Valve: The tricuspid valve is normal in structure. Tricuspid valve regurgitation is mild. Aortic Valve: The aortic valve is tricuspid. There is moderate calcification of the aortic valve. There is moderate thickening of the aortic valve. Aortic valve regurgitation is mild. Aortic regurgitation PHT measures 500 msec. Severe aortic stenosis is present. Aortic valve mean gradient measures 22.0 mmHg. Aortic valve peak gradient measures 32.8 mmHg. Aortic valve area, by VTI measures 0.83 cm. Pulmonic Valve: The pulmonic valve was not well visualized. Pulmonic valve regurgitation is trivial. Aorta: The  aortic root is normal in size and structure and aortic dilatation noted. There is moderate dilatation of the ascending aorta, measuring 44 mm. Venous: The inferior vena cava is dilated in size with less than 50% respiratory variability, suggesting right atrial pressure of 15 mmHg. IAS/Shunts: The atrial septum is grossly normal.  LEFT VENTRICLE PLAX 2D LVIDd:         4.70 cm      Diastology LVIDs:         3.10 cm      LV e' medial:    4.60 cm/s LV PW:         1.00 cm      LV E/e' medial:  33.0 LV IVS:        0.90 cm      LV e' lateral:   6.05 cm/s LVOT diam:     2.10 cm      LV E/e' lateral: 25.1 LV SV:         58 LV SV Index:   22 LVOT Area:     3.46 cm  LV Volumes (MOD) LV vol d, MOD A2C: 68.4 ml LV vol d, MOD A4C: 127.0 ml LV vol s, MOD A2C: 29.4 ml LV vol s, MOD A4C: 55.2 ml LV SV MOD A2C:     39.0 ml LV SV  MOD A4C:     127.0 ml LV SV MOD BP:      53.0 ml RIGHT VENTRICLE RV S prime:     7.76 cm/s TAPSE (M-mode): 1.7 cm LEFT ATRIUM             Index        RIGHT ATRIUM           Index LA diam:        4.40 cm 1.66 cm/m   RA Area:     17.70 cm LA Vol (A2C):   87.4 ml 33.04 ml/m  RA Volume:   45.00 ml  17.01 ml/m LA Vol (A4C):   95.4 ml 36.07 ml/m LA Biplane Vol: 93.0 ml 35.16 ml/m  AORTIC VALVE AV Area (Vmax):    0.93 cm AV Area (Vmean):   0.83 cm AV Area (VTI):     0.83 cm AV Vmax:           286.33 cm/s AV Vmean:          222.667 cm/s AV VTI:            0.702 m AV Peak Grad:      32.8 mmHg AV Mean Grad:      22.0 mmHg LVOT Vmax:         77.17 cm/s LVOT Vmean:        53.333 cm/s LVOT VTI:          0.168 m LVOT/AV VTI ratio: 0.24 AI PHT:            500 msec  AORTA Ao Root diam: 3.50 cm Ao Asc diam:  4.40 cm MITRAL VALVE                TRICUSPID VALVE MV Area (PHT): 3.77 cm     TR Peak grad:   28.9 mmHg MV Decel Time: 201 msec     TR Vmax:        269.00 cm/s MR Peak grad: 112.8 mmHg MR Mean grad: 73.0 mmHg     SHUNTS MR Vmax:      531.00 cm/s   Systemic VTI:  0.17 m MR Vmean:     411.0 cm/s    Systemic  Diam: 2.10 cm MV E velocity: 152.00 cm/s MV A velocity: 85.00 cm/s MV E/A ratio:  1.79 Gwyndolyn Kaufman MD Electronically signed by Gwyndolyn Kaufman MD Signature Date/Time: 11/02/2021/4:50:05 PM    Final      [facility] Cardiomyopathy Questionnaire  KCCQ-12 11/03/2021  1 a. Ability to shower/bathe Moderately limited  1 b. Ability to walk 1 block Extremely limited  1 c. Ability to hurry/jog Extremely limited  2. Edema feet/ankles/legs Never over the past 2 weeks  3. Limited by fatigue All of the time  4. Limited by dyspnea All of the time  5. Sitting up / on 3+ pillows Every night  6. Limited enjoyment of life Extremely limited  7. Rest of life w/ symptoms Not at all satisfied  8 a. Participation in hobbies Severely limited  8 b. Participation in chores Severely limited  8 c. Visiting family/friends Severely limited       STS Risk Calculator: Procedure: AV Replacement  Risk of Mortality: 4.013% Renal Failure: 4.002% Permanent Stroke: 2.124% Prolonged Ventilation: 12.162% DSW Infection: 0.137% Reoperation: 4.221% Morbidity or Mortality: 19.886% Short Length of Stay: 14.098% Long Length of Stay: 11.598%  Assessment and Plan:   Denis Coch is a 88 y.o. male with symptoms of severe, stage D3  aortic stenosis with NYHA Class IV symptoms who is currently admitted for acute on chronic diastolic CHF. I have reviewed the patient's recent echocardiogram which is notable for preserved LV systolic function and severe paradoxical LFLG aortic stenosis with peak gradient of 32.8 mm hg and mean transvalvular gradient of 22 mm hg. The patient's dimensionless index is 0.24, stroke volume index 33 and calculated aortic valve area is 0.83 cm².  ° °I have reviewed the natural history of aortic stenosis with the patient. We have discussed the limitations of medical therapy and the poor prognosis associated with symptomatic aortic stenosis. We have reviewed potential treatment options, including  palliative medical therapy, conventional surgical aortic valve replacement, and transcatheter aortic valve replacement. We discussed treatment options in the context of this patient's specific comorbid medical conditions.  °  °The patient's predicted risk of mortality with conventional aortic valve replacement is 4.013% primarily based on age, acute CHF, HTN, and afib. TAVR seems like a reasonable treatment option for this patient pending formal cardiac surgical consultation. We discussed typical evaluation which will require L/RHC and a gated cardiac CTA and a CTA of the chest/abdomen/pelvis to evaluate both his cardiac anatomy and peripheral vasculature. Follow-up testing will be arranged as an inpatient and outpatient.  ° °The patient is now amendable to oral anticoagulation for his atrial fibrillation. I think we should proceed with L/RHC while admitted. This has been set up for tomorrow at 1:30pm with Dr. Cooper. Then he can be started on Eliquis. If he remains inpatient, we can try to get his TAVR scans if renal function remains stable. If not, we can allow for discharge home with outpatient CT scans. He has an apt with Dr. Bartle on 1/23 @ 3pm.  ° °I have reviewed the risks, indications, and alternatives to cardiac catheterization and possible angioplasty/stenting with the patient. Risks include but are not limited to bleeding, infection, vascular injury, stroke, myocardial infection, arrhythmia, kidney injury, radiation-related injury in the case of prolonged fluoroscopy use, emergency cardiac surgery, and death. The patient understands the risks of serious complication is low (<1%).  ° ° °For questions or updates, please contact [facility] °Please consult [email or web address] for contact info under  ° ° °Signed, °Jaice Digioia, PA-C  °11/03/2021 4:17 PM ° ° °I have personally seen and examined this patient. I agree with the assessment and plan as outlined above.  °88 yo male with paroxysmal atrial  fibrillation, mitral valve regurgitation and now finding of severe aortic stenosis who we are asked to see today to discuss his aortic stenosis and possible TAVR.  °He is a pleasant, active elderly male. He is a former college athlete. He was found to have atrial fibrillation in 2014 during routine pre-op testing for cataract surgery. He refused anti-coagulation. He has not been followed by cardiology. Last echo in 2015 with LVEF=65-70%, aortic valve sclerosis and moderate mitral and tricuspid regurgitation.  °He had been in usual state of good health until the last month. He has been noticing exercise intolerance, dyspnea on exertion, orthopnea and PND. He was admitted to [facility] 11/01/21 with worsened dyspnea and was found to have acute hypoxic respiratory failure. He was volume overloaded and has been diuresed with IV Lasix with rapid improvement in his symptoms. EKG with rapid atrial fibrillation.  °Echo from 11/02/21 is reviewed by me personally and shows normal LV systolic function with thickened and calcified aortic valve with likely severe low flow/low gradient aortic stenosis. (Mean gradient 22 mmHg, DI 24, SVI 33, AVA   0.83 cm2).  He tells me today that he feels much better. Dyspnea has resolved. No chest pain.  Labs reviewed by me.  EKG personally reviewed by me.  My exam: I agree with the above. Pt is a pleasant elderly male in NAD. Loud harsh systolic murmur. Lungs clear. Trace bilateral LE edema.   Plan: Severe low flow/low gradient aortic stenosis: He has severe, stage D2 aortic valve stenosis. I have personally reviewed the echo images. The aortic valve is thickened, calcified with limited leaflet mobility. I think he would benefit from AVR. Given advanced age, he is not a good candidate for conventional AVR by surgical approach. I think he may be a good candidate for TAVR.   I have reviewed the natural history of aortic stenosis with the patient and their family members  who are present today. We  have discussed the limitations of medical therapy and the poor prognosis associated with symptomatic aortic stenosis. We have reviewed potential treatment options, including palliative medical therapy, conventional surgical aortic valve replacement, and transcatheter aortic valve replacement. We discussed treatment options in the context of the patient's specific comorbid medical conditions.   He would like to proceed with planning for TAVR. We will plan a right and left heart catheterization today and will arrange his pre-TAVR CT scans either before discharge or as an outpatient.  We will arrange outpatient follow up with Dr. Cyndia Bent with CT surgery.    Lauree Chandler 11/04/2021 10:05 AM

## 2021-11-04 ENCOUNTER — Encounter (HOSPITAL_COMMUNITY)
Admission: EM | Disposition: A | Discharge status: Home / Self Care | Payer: Self-pay | Source: Home / Self Care | Attending: Internal Medicine

## 2021-11-04 ENCOUNTER — Other Ambulatory Visit (HOSPITAL_COMMUNITY): Payer: Self-pay

## 2021-11-04 DIAGNOSIS — I350 Nonrheumatic aortic (valve) stenosis: Secondary | ICD-10-CM | POA: Diagnosis not present

## 2021-11-04 DIAGNOSIS — I4891 Unspecified atrial fibrillation: Secondary | ICD-10-CM | POA: Diagnosis not present

## 2021-11-04 DIAGNOSIS — I2510 Atherosclerotic heart disease of native coronary artery without angina pectoris: Secondary | ICD-10-CM | POA: Diagnosis not present

## 2021-11-04 DIAGNOSIS — R0603 Acute respiratory distress: Secondary | ICD-10-CM

## 2021-11-04 DIAGNOSIS — J9601 Acute respiratory failure with hypoxia: Secondary | ICD-10-CM | POA: Diagnosis not present

## 2021-11-04 HISTORY — PX: RIGHT/LEFT HEART CATH AND CORONARY ANGIOGRAPHY: CATH118266

## 2021-11-04 LAB — POCT I-STAT EG7
Acid-Base Excess: 6 mmol/L — ABNORMAL HIGH (ref 0.0–2.0)
Bicarbonate: 32.7 mmol/L — ABNORMAL HIGH (ref 20.0–28.0)
Calcium, Ion: 1.21 mmol/L (ref 1.15–1.40)
HCT: 38 % — ABNORMAL LOW (ref 39.0–52.0)
Hemoglobin: 12.9 g/dL — ABNORMAL LOW (ref 13.0–17.0)
O2 Saturation: 64 %
Potassium: 4.2 mmol/L (ref 3.5–5.1)
Sodium: 137 mmol/L (ref 135–145)
TCO2: 34 mmol/L — ABNORMAL HIGH (ref 22–32)
pCO2, Ven: 56.3 mmHg (ref 44.0–60.0)
pH, Ven: 7.372 (ref 7.250–7.430)
pO2, Ven: 35 mmHg (ref 32.0–45.0)

## 2021-11-04 LAB — BASIC METABOLIC PANEL
Anion gap: 10 (ref 5–15)
BUN: 18 mg/dL (ref 8–23)
CO2: 26 mmol/L (ref 22–32)
Calcium: 8.4 mg/dL — ABNORMAL LOW (ref 8.9–10.3)
Chloride: 98 mmol/L (ref 98–111)
Creatinine, Ser: 0.83 mg/dL (ref 0.61–1.24)
GFR, Estimated: >60 mL/min (ref >60)
Glucose, Bld: 94 mg/dL (ref 70–99)
Potassium: 4.1 mmol/L (ref 3.5–5.1)
Sodium: 134 mmol/L — ABNORMAL LOW (ref 135–145)

## 2021-11-04 LAB — POCT I-STAT 7, (LYTES, BLD GAS, ICA,H+H)
Acid-Base Excess: 5 mmol/L — ABNORMAL HIGH (ref 0.0–2.0)
Bicarbonate: 30.4 mmol/L — ABNORMAL HIGH (ref 20.0–28.0)
Calcium, Ion: 1.15 mmol/L (ref 1.15–1.40)
HCT: 38 % — ABNORMAL LOW (ref 39.0–52.0)
Hemoglobin: 12.9 g/dL — ABNORMAL LOW (ref 13.0–17.0)
O2 Saturation: 99 %
Potassium: 4.2 mmol/L (ref 3.5–5.1)
Sodium: 137 mmol/L (ref 135–145)
TCO2: 32 mmol/L (ref 22–32)
pCO2 arterial: 45.3 mmHg (ref 32.0–48.0)
pH, Arterial: 7.435 (ref 7.350–7.450)
pO2, Arterial: 121 mmHg — ABNORMAL HIGH (ref 83.0–108.0)

## 2021-11-04 LAB — CBC
HCT: 38.2 % — ABNORMAL LOW (ref 39.0–52.0)
Hemoglobin: 12.4 g/dL — ABNORMAL LOW (ref 13.0–17.0)
MCH: 31.3 pg (ref 26.0–34.0)
MCHC: 32.5 g/dL (ref 30.0–36.0)
MCV: 96.5 fL (ref 80.0–100.0)
Platelets: 202 10*3/uL (ref 150–400)
RBC: 3.96 MIL/uL — ABNORMAL LOW (ref 4.22–5.81)
RDW: 13.9 % (ref 11.5–15.5)
WBC: 7.6 10*3/uL (ref 4.0–10.5)
nRBC: 0 % (ref 0.0–0.2)

## 2021-11-04 LAB — HEPARIN LEVEL (UNFRACTIONATED): Heparin Unfractionated: 0.43 IU/mL (ref 0.30–0.70)

## 2021-11-04 SURGERY — RIGHT/LEFT HEART CATH AND CORONARY ANGIOGRAPHY
Anesthesia: LOCAL

## 2021-11-04 MED ORDER — HEPARIN (PORCINE) IN NACL 1000-0.9 UT/500ML-% IV SOLN
INTRAVENOUS | Status: DC | PRN
  Administered 2021-11-04 (×2): 500 mL

## 2021-11-04 MED ORDER — FENTANYL CITRATE (PF) 100 MCG/2ML IJ SOLN
INTRAMUSCULAR | Status: DC | PRN
  Administered 2021-11-04: 25 ug via INTRAVENOUS

## 2021-11-04 MED ORDER — IOHEXOL 350 MG/ML SOLN
INTRAVENOUS | Status: DC | PRN
  Administered 2021-11-04: 60 mL

## 2021-11-04 MED ORDER — LIDOCAINE HCL (PF) 1 % IJ SOLN
INTRAMUSCULAR | Status: AC
  Filled 2021-11-04: qty 30

## 2021-11-04 MED ORDER — VERAPAMIL HCL 2.5 MG/ML IV SOLN
INTRAVENOUS | Status: DC | PRN
  Administered 2021-11-04: 10 mL via INTRA_ARTERIAL

## 2021-11-04 MED ORDER — SODIUM CHLORIDE 0.9 % IV SOLN
INTRAVENOUS | Status: AC | PRN
  Administered 2021-11-04: 10 mL/h via INTRAVENOUS

## 2021-11-04 MED ORDER — HEPARIN SODIUM (PORCINE) 1000 UNIT/ML IJ SOLN
INTRAMUSCULAR | Status: AC
  Filled 2021-11-04: qty 10

## 2021-11-04 MED ORDER — LABETALOL HCL 5 MG/ML IV SOLN: 10.0000 mg | INTRAVENOUS | Status: AC | PRN

## 2021-11-04 MED ORDER — METOPROLOL TARTRATE 25 MG PO TABS: 25.0000 mg | ORAL_TABLET | ORAL | Status: DC | PRN

## 2021-11-04 MED ORDER — HYDRALAZINE HCL 20 MG/ML IJ SOLN: 10.0000 mg | INTRAMUSCULAR | Status: AC | PRN

## 2021-11-04 MED ORDER — SODIUM CHLORIDE 0.9 % IV SOLN: 250.0000 mL | INTRAVENOUS | Status: DC | PRN

## 2021-11-04 MED ORDER — MIDAZOLAM HCL 2 MG/2ML IJ SOLN
INTRAMUSCULAR | Status: DC | PRN
  Administered 2021-11-04: 1 mg via INTRAVENOUS

## 2021-11-04 MED ORDER — VERAPAMIL HCL 2.5 MG/ML IV SOLN
INTRAVENOUS | Status: AC
  Filled 2021-11-04: qty 2

## 2021-11-04 MED ORDER — LIDOCAINE HCL (PF) 1 % IJ SOLN
INTRAMUSCULAR | Status: DC | PRN
  Administered 2021-11-04: 3 mL
  Administered 2021-11-04: 2 mL
  Administered 2021-11-04: 15 mL

## 2021-11-04 MED ORDER — IPRATROPIUM-ALBUTEROL 0.5-2.5 (3) MG/3ML IN SOLN: 3.0000 mL | Freq: Four times a day (QID) | RESPIRATORY_TRACT | Status: DC | PRN

## 2021-11-04 MED ORDER — FENTANYL CITRATE (PF) 100 MCG/2ML IJ SOLN
INTRAMUSCULAR | Status: AC
  Filled 2021-11-04: qty 2

## 2021-11-04 MED ORDER — MIDAZOLAM HCL 2 MG/2ML IJ SOLN
INTRAMUSCULAR | Status: AC
  Filled 2021-11-04: qty 2

## 2021-11-04 MED ORDER — SODIUM CHLORIDE 0.9% FLUSH
3.0000 mL | Freq: Two times a day (BID) | INTRAVENOUS | Status: DC
  Administered 2021-11-05 – 2021-11-06 (×4): 3 mL via INTRAVENOUS

## 2021-11-04 MED ORDER — APIXABAN 5 MG PO TABS
5.0000 mg | ORAL_TABLET | Freq: Two times a day (BID) | ORAL | Status: DC
  Administered 2021-11-05 – 2021-11-07 (×5): 5 mg via ORAL
  Filled 2021-11-04 (×5): qty 1

## 2021-11-04 MED ORDER — FUROSEMIDE 10 MG/ML IJ SOLN
40.0000 mg | Freq: Two times a day (BID) | INTRAMUSCULAR | Status: DC
  Administered 2021-11-04: 40 mg via INTRAVENOUS
  Filled 2021-11-04: qty 4

## 2021-11-04 MED ORDER — SODIUM CHLORIDE 0.9% FLUSH: 3.0000 mL | INTRAVENOUS | Status: DC | PRN

## 2021-11-04 SURGICAL SUPPLY — 24 items
CATH 5FR JL3.5 JR4 ANG PIG MP (CATHETERS) ×1 IMPLANT
CATH BALLN WEDGE 5F 110CM (CATHETERS) ×1 IMPLANT
CATH INFINITI 5 FR AL2 (CATHETERS) ×1 IMPLANT
CATH INFINITI 5FR AL1 (CATHETERS) ×1 IMPLANT
CATH INFINITI 5FR JL5 (CATHETERS) ×1 IMPLANT
CLOSURE MYNX CONTROL 5F (Vascular Products) ×1 IMPLANT
DEVICE RAD COMP TR BAND LRG (VASCULAR PRODUCTS) ×1 IMPLANT
GLIDESHEATH SLEND SS 6F .021 (SHEATH) ×1 IMPLANT
GUIDEWIRE .025 260CM (WIRE) ×1 IMPLANT
GUIDEWIRE ANGLED .035X150CM (WIRE) ×1 IMPLANT
GUIDEWIRE INQWIRE 1.5J.035X260 (WIRE) IMPLANT
INQWIRE 1.5J .035X260CM (WIRE) ×2
KIT HEART LEFT (KITS) ×2 IMPLANT
PACK CARDIAC CATHETERIZATION (CUSTOM PROCEDURE TRAY) ×2 IMPLANT
SHEATH GLIDE SLENDER 4/5FR (SHEATH) ×1 IMPLANT
SHEATH PINNACLE 5F 10CM (SHEATH) ×1 IMPLANT
SHEATH PROBE COVER 6X72 (BAG) ×1 IMPLANT
TRANSDUCER W/STOPCOCK (MISCELLANEOUS) ×2 IMPLANT
TUBING CIL FLEX 10 FLL-RA (TUBING) ×2 IMPLANT
WIRE EMERALD 3MM-J .035X150CM (WIRE) ×1 IMPLANT
WIRE EMERALD ST .035X150CM (WIRE) ×1 IMPLANT
WIRE EMERALD ST .035X260CM (WIRE) ×1 IMPLANT
WIRE HI TORQ VERSACORE-J 145CM (WIRE) ×1 IMPLANT
WIRE MICRO SET SILHO 5FR 7 (SHEATH) ×1 IMPLANT

## 2021-11-04 NOTE — Progress Notes (Signed)
TR band has been removed, site is level zero, Tegaderm applied to site.

## 2021-11-04 NOTE — Progress Notes (Signed)
SATURATION QUALIFICATIONS: (This note is used to comply with regulatory documentation for home oxygen)  Patient Saturations on Room Air at Rest = 96%  Patient Saturations on Room Air while Ambulating = 96%   Please briefly explain why patient needs home oxygen: Pt does display DOE 3/4 but maintains sats >/= 96% on RA with mobility and thus does not need supplemental O2 at this time.  Raymond Gurney, PT, DPT Acute Rehabilitation Services  Pager: [phone] Office: [phone]

## 2021-11-04 NOTE — Progress Notes (Addendum)
ANTICOAGULATION CONSULT NOTE - Follow Up Consult  Pharmacy Consult for Heparin Indication: atrial fibrillation  No Known Allergies  Patient Measurements: Height: 6\' 5"  (195.6 cm) Weight: 131 kg (288 lb 12.8 oz) IBW/kg (Calculated) : 89.1 Heparin Dosing Weight: 117 kg  Vital Signs: Temp: 97.2 F (36.2 C) (01/06 0308) Temp Source: Oral (01/06 0308) BP: 142/59 (01/06 0308) Pulse Rate: 75 (01/06 0938)  Labs: Recent Labs    11/01/21 1422 11/01/21 1448 11/01/21 1653 11/01/21 2321 11/02/21 0134 11/03/21 0111 11/04/21 0316  HGB  --    < >  --   --  12.5* 13.0 12.4*  HCT  --    < >  --   --  37.6* 39.2 38.2*  PLT  --   --   --   --  218 197 202  HEPARINUNFRC  --   --   --    < > 0.76* 0.52 0.43  CREATININE  --   --   --   --  0.76 0.89 0.83  TROPONINIHS 23*  --  25*  --   --   --   --    < > = values in this interval not displayed.     Estimated Creatinine Clearance: 85.1 mL/min (by C-G formula based on SCr of 0.83 mg/dL).  Assessment: 49 YOM with shortness of breath admitted for acute hypoxic respiratory failure. PMH significant for Afib with RVR. No history of anticoagulation prior to admission. Pharmacy to dose IV heparin.    Heparin level  0.43 is therapeutic  on 1500 units/hr. CBC stable. No bleeding reported.    Cardiologist noted discussed anticoagulation with patient, previously declined in past, but is now agreeable. Plan to transition to eliquis following completion of all planned procedures.   Plan for L/R heart cath today 11/04/21.  Goal of Therapy:  Heparin level 0.3-0.7 units/ml Monitor platelets by anticoagulation protocol: Yes   Plan:  Continue heparin drip at 1500 units/hr Daily heparin level and CBC. Follow up anticoagulation plans post Cath today. Copay check requested for Eliquis> no insurance found> ordered case management consult for medication assistance.   Thank you for allowing pharmacy to be part of this patients care team.  Nicole Cella,  [place] Pharmacist [phone] 11/04/2021 10:13 AM Please check AMION for all [place] phone numbers After 10:00 PM, call [place] [phone]

## 2021-11-04 NOTE — Progress Notes (Signed)
PROGRESS NOTE    Louis Koch  [id number] DOB: Jun 14, 1927 DOA: 11/01/2021 PCP: Pcp, No  Brief Narrative: 89/M with history of paroxysmal atrial fibrillation, not on anticoagulation, moderate aortic stenosis, moderate MR presented to the ED with shortness of breath.  Patient has not seen a medical doctor in over 5+ years, reports progressive dyspnea on exertion for 1 month, significantly worse in the last 3 days with orthopnea.  Subsequently EMS was called, upon arrival O2 sats were in the 70s with wheezing and respiratory distress, he was placed on a nonrebreather, given nebulizations and brought to the ED. -In the emergency room he was noted to be in distress, chest x-ray noted cardiomegaly and interstitial edema   Assessment & Plan:  Acute hypoxic respiratory failure Acute diastolic CHF Severe aortic stenosis -Volume status is improving, hold Lasix today -2D echo noted preserved EF, grade 2 diastolic dysfunction, severe aortic stenosis  -Cardiology consulting, plan for TAVR work-up, left heart cath today  -Wean O2, ambulate  A. fib RVR -Continue low-dose metoprolol -Remains on IV heparin at this time, previously declined anticoagulation, now agreeable, -Transition to Eliquis after cardiac work-up completed  Hyponatremia -Secondary to hypovolemic state, monitor with diuresis  DVT prophylaxis: IV heparin Code Status: Full code Family Communication: Discussed with patient and son at bedside yesterday Disposition Plan: Home pending above work-up Status is: Inpatient  Remains inpatient appropriate because: Severity of illness  Consultants:  Cardiology  Procedures:   Antimicrobials:    Subjective: -Feels better overall, breathing considerably improved, awaiting heart cath  Objective: Vitals:   11/04/21 1534 11/04/21 1545 11/04/21 1600 11/04/21 1625  BP: (!) 156/97 138/90 (!) 134/101 125/90  Pulse:      Resp: 16   16  Temp:      TempSrc:      SpO2: 97%     Weight:       Height:        Intake/Output Summary (Last 24 hours) at 11/04/2021 1707 Last data filed at 11/04/2021 1624 Gross per 24 hour  Intake 603.45 ml  Output 1675 ml  Net -1071.55 ml   Filed Weights   11/02/21 0413 11/03/21 0416 11/04/21 0444  Weight: 134.6 kg 132.9 kg 131 kg    Examination:  General exam: Pleasant elderly male sitting up in bed, AAOx3, nondistressed HEENT: Positive JVD CVS: S1-S2, regular rhythm, systolic murmur Lungs: Few basilar rales Abdomen: Soft, obese, nontender, bowel sounds present Extremities: No edema Skin: No rash on exposed skin Psych: Appropriate mood and affect   Data Reviewed:   CBC: Recent Labs  Lab 11/01/21 1155 11/01/21 1448 11/02/21 0134 11/03/21 0111 11/04/21 0316 11/04/21 1423 11/04/21 1438  WBC 6.0  --  4.9 8.8 7.6  --   --   HGB 13.5   < > 12.5* 13.0 12.4* 12.9* 12.9*  HCT 41.0   < > 37.6* 39.2 38.2* 38.0* 38.0*  MCV 95.6  --  94.0 95.1 96.5  --   --   PLT 212  --  218 197 202  --   --    < > = values in this interval not displayed.   Basic Metabolic Panel: Recent Labs  Lab 11/01/21 1155 11/01/21 1448 11/02/21 0134 11/03/21 0111 11/04/21 0316 11/04/21 1423 11/04/21 1438  NA 133*   < > 132* 132* 134* 137 137  K 3.5   < > 4.2 3.9 4.1 4.2 4.2  CL 97*  --  99 100 98  --   --   CO2 27  --  26 24 26   --   --   GLUCOSE 130*  --  134* 111* 94  --   --   BUN 11  --  14 18 18   --   --   CREATININE 0.88  --  0.76 0.89 0.83  --   --   CALCIUM 8.7*  --  8.7* 8.5* 8.4*  --   --    < > = values in this interval not displayed.   GFR: Estimated Creatinine Clearance: 85.1 mL/min (by C-G formula based on SCr of 0.83 mg/dL). Liver Function Tests: Recent Labs  Lab 11/01/21 1155  AST 32  ALT 28  ALKPHOS 104  BILITOT 2.4*  PROT 6.8  ALBUMIN 3.7   No results for input(s): LIPASE, AMYLASE in the last 168 hours. No results for input(s): AMMONIA in the last 168 hours. Coagulation Profile: No results for input(s): INR, PROTIME  in the last 168 hours. Cardiac Enzymes: No results for input(s): CKTOTAL, CKMB, CKMBINDEX, TROPONINI in the last 168 hours. BNP (last 3 results) No results for input(s): PROBNP in the last 8760 hours. HbA1C: No results for input(s): HGBA1C in the last 72 hours. CBG: No results for input(s): GLUCAP in the last 168 hours. Lipid Profile: No results for input(s): CHOL, HDL, LDLCALC, TRIG, CHOLHDL, LDLDIRECT in the last 72 hours. Thyroid Function Tests: No results for input(s): TSH, T4TOTAL, FREET4, T3FREE, THYROIDAB in the last 72 hours. Anemia Panel: No results for input(s): VITAMINB12, FOLATE, FERRITIN, TIBC, IRON, RETICCTPCT in the last 72 hours. Urine analysis: No results found for: COLORURINE, APPEARANCEUR, LABSPEC, PHURINE, GLUCOSEU, HGBUR, BILIRUBINUR, KETONESUR, PROTEINUR, UROBILINOGEN, NITRITE, LEUKOCYTESUR Sepsis Labs: @LABRCNTIP (procalcitonin:4,lacticidven:4)  ) Recent Results (from the past 240 hour(s))  Resp Panel by RT-PCR (Flu A&B, Covid) Nasopharyngeal Swab     Status: None   Collection Time: 11/01/21 11:30 AM   Specimen: Nasopharyngeal Swab; Nasopharyngeal(NP) swabs in vial transport medium  Result Value Ref Range Status   SARS Coronavirus 2 by RT PCR NEGATIVE NEGATIVE Final    Comment: (NOTE) SARS-CoV-2 target nucleic acids are NOT DETECTED.  The SARS-CoV-2 RNA is generally detectable in upper respiratory specimens during the acute phase of infection. The lowest concentration of SARS-CoV-2 viral copies this assay can detect is 138 copies/mL. A negative result does not preclude SARS-Cov-2 infection and should not be used as the sole basis for treatment or other patient management decisions. A negative result may occur with  improper specimen collection/handling, submission of specimen other than nasopharyngeal swab, presence of viral mutation(s) within the areas targeted by this assay, and inadequate number of viral copies(<138 copies/mL). A negative result must be  combined with clinical observations, patient history, and epidemiological information. The expected result is Negative.  Fact Sheet for Patients:  EntrepreneurPulse.com.au  Fact Sheet for Healthcare Providers:  IncredibleEmployment.be  This test is no t yet approved or cleared by the [place] FDA and  has been authorized for detection and/or diagnosis of SARS-CoV-2 by FDA under an Emergency Use Authorization (EUA). This EUA will remain  in effect (meaning this test can be used) for the duration of the COVID-19 declaration under Section 564(b)(1) of the Act, 21 U.S.C.section 360bbb-3(b)(1), unless the authorization is terminated  or revoked sooner.       Influenza A by PCR NEGATIVE NEGATIVE Final   Influenza B by PCR NEGATIVE NEGATIVE Final    Comment: (NOTE) The Xpert Xpress SARS-CoV-2/FLU/RSV plus assay is intended as an aid in the diagnosis of influenza from Nasopharyngeal swab specimens  and should not be used as a sole basis for treatment. Nasal washings and aspirates are unacceptable for Xpert Xpress SARS-CoV-2/FLU/RSV testing.  Fact Sheet for Patients: EntrepreneurPulse.com.au  Fact Sheet for Healthcare Providers: IncredibleEmployment.be  This test is not yet approved or cleared by the [place] FDA and has been authorized for detection and/or diagnosis of SARS-CoV-2 by FDA under an Emergency Use Authorization (EUA). This EUA will remain in effect (meaning this test can be used) for the duration of the COVID-19 declaration under Section 564(b)(1) of the Act, 21 U.S.C. section 360bbb-3(b)(1), unless the authorization is terminated or revoked.  Performed at [facility], [place] [address], [place], [place] [address]          Radiology Studies: CARDIAC CATHETERIZATION  Result Date: 11/04/2021   Mid RCA lesion is 50% stenosed.   Mid LAD lesion is 40% stenosed. 1.  Mild to moderate  nonobstructive coronary artery disease with moderate ostial RCA stenosis, mild to moderate mid RCA stenosis, and an otherwise patent right coronary artery. 2.  Patent left main with mild plaquing 3.  Patent LAD with mild mid vessel stenosis of 40% 4.  Patent left circumflex with mild luminal irregularity. 5.  Calcified, restricted aortic valve with peak to peak gradient 24 mmHg 6.  Elevated intracardiac filling pressures with mean wedge pressure of 27, mean PA pressure of 48, and right atrial mean pressure of 16. Recommend: Continued TAVR evaluation, continued IV diuresis for treatment of heart failure, okay to start apixaban tomorrow morning.     Scheduled Meds:  [START ON 11/05/2021] apixaban  5 mg Oral BID   furosemide  40 mg Intravenous Q12H   guaiFENesin  1,200 mg Oral BID   metoprolol tartrate  12.5 mg Oral BID   polyethylene glycol  17 g Oral Daily   sodium chloride flush  3 mL Intravenous Q12H   Continuous Infusions:  sodium chloride     sodium chloride       LOS: 3 days    Time spent: 50min    Domenic Polite, MD [facility]   11/04/2021, 5:07 PM

## 2021-11-04 NOTE — Progress Notes (Addendum)
Physical Therapy Treatment Patient Details Name: Louis Koch MRN: [id number] DOB: 1927/05/30 Today's Date: 11/04/2021   History of Present Illness Pt is a 88 y.o. male who presented 11/01/21 with SOB. Pt admitted with CHF and Afib with RVR. Chest x-ray showed cardiomegaly and interstitial congestion bilaterally suspicious for pulmonary congestion versus pneumonia. PMH: chronic diastolic CHF, moderate aortic stenosis, moderate mitral regurgitation ([facility] record 2015), bilateral knee OA status post replacement    PT Comments    Pt agreeable to exercises in room, but after x10 mini-squats at the sink he became fatigues with DOE 3/4 but SpO2 stable on RA. Pt desired to take it easy before his planned procedure today, thus finished with seated exercises. Educated pt and his family on use of pulse ox, rollator in community, pacing self to conserve energy, and breathing techniques. Will continue to follow acutely. Current recommendations remain appropriate.    Recommendations for follow up therapy are one component of a multi-disciplinary discharge planning process, led by the attending physician.  Recommendations may be updated based on patient status, additional functional criteria and insurance authorization.  Follow Up Recommendations  No PT follow up     Assistance Recommended at Discharge PRN  Patient can return home with the following     Equipment Recommendations  Rollator (4 wheels) (bariatric and tall for 6\' 5"  height)    Recommendations for Other Services       Precautions / Restrictions Precautions Precautions: Fall Restrictions Weight Bearing Restrictions: No     Mobility  Bed Mobility               General bed mobility comments: Pt sitting up in recliner upon arrival.    Transfers Overall transfer level: Needs assistance Equipment used: None Transfers: Sit to/from Stand Sit to Stand: Min guard           General transfer comment: Cues provided to push up  off armrests to come to stand from recliner, success but pt's knees appeared to give once upright the first time in which pt quickly returned to sitting in recliner. Improved stability and able to maintain standing the second rep, min guard for safety.    Ambulation/Gait Ambulation/Gait assistance: Min guard Gait Distance (Feet): 20 Feet Assistive device: None Gait Pattern/deviations: Step-through pattern;Decreased stride length;Trunk flexed Gait velocity: reduced Gait velocity interpretation: >4.37 ft/sec, indicative of normal walking speed   General Gait Details: Pt with slight anterior and L lateral trunk flexion throughout gait, likely baseline due to hx of knee surgeries. Pt mostly steady, but with intermittent bouts of trunk sway, min guard for safety. Pt needing cues to slow pace to allow for safe line management and to conserve energy, but poor compliance by pt. DOE 3/4 but SpO2 stable on RA   Stairs             Wheelchair Mobility    Modified Rankin (Stroke Patients Only)       Balance Overall balance assessment: Mild deficits observed, not formally tested                                          Cognition Arousal/Alertness: Awake/alert Behavior During Therapy: WFL for tasks assessed/performed Overall Cognitive Status: Within Functional Limits for tasks assessed  General Comments: Pt can be a bit quick and impulsive with movements, needing cues to slow down for line management and energy conservation.        Exercises General Exercises - Lower Extremity Long Arc Quad: Strengthening;AROM;Both;15 reps;Seated (cues to slow and perform with control) Hip ABduction/ADduction: AROM;Strengthening;Both;5 reps;Seated Mini-Sqauts: Strengthening;Both;10 reps;Standing (at sink)    General Comments General comments (skin integrity, edema, etc.): SpO2 stable >/= 96% on RA throughout, but DOE 3/4; educated pt on  energy conservation techniques, slowing pace as needed, breathing techniques, use of pulse ox, and use of rollator in community      Pertinent Vitals/Pain Pain Assessment: No/denies pain    Home Living                          Prior Function            PT Goals (current goals can now be found in the care plan section) Acute Rehab PT Goals Patient Stated Goal: to take it easy today PT Goal Formulation: With patient Time For Goal Achievement: 11/16/21 Potential to Achieve Goals: Good Progress towards PT goals: Progressing toward goals    Frequency    Min 2X/week      PT Plan Equipment recommendations need to be updated;Frequency needs to be updated    Co-evaluation              AM-PAC PT "6 Clicks" Mobility   Outcome Measure  Help needed turning from your back to your side while in a flat bed without using bedrails?: None Help needed moving from lying on your back to sitting on the side of a flat bed without using bedrails?: None Help needed moving to and from a bed to a chair (including a wheelchair)?: A Little Help needed standing up from a chair using your arms (e.g., wheelchair or bedside chair)?: A Little Help needed to walk in hospital room?: A Little Help needed climbing 3-5 steps with a railing? : A Little 6 Click Score: 20    End of Session   Activity Tolerance: Patient tolerated treatment well;Patient limited by fatigue Patient left: in chair;with call bell/phone within reach;with family/visitor present   PT Visit Diagnosis: Unsteadiness on feet (R26.81);Other abnormalities of gait and mobility (R26.89);Difficulty in walking, not elsewhere classified (R26.2)     Time: [id number] PT Time Calculation (min) (ACUTE ONLY): 22 min  Charges:  $Therapeutic Activity: 8-22 mins                     Moishe Spice, PT, DPT Acute Rehabilitation Services  Pager: [phone] Office: [place] 11/04/2021, 12:34 PM

## 2021-11-04 NOTE — Progress Notes (Signed)
Progress Note  Patient Name: Louis Koch Date of Encounter: 11/04/2021  [facility] Cardiologist: Buford Dresser, MD   Subjective   Patient reports that he is feeling good today and is ready for his cath this afternoon. Denies any chest pain, palpitations, dizziness/lightheadedness, swelling in his ankles. Reports rare instances of SOB that are relieved with oxygen through a nasal cannula. Did not have any questions about upcoming cath or ongoing TAVR workup.   Inpatient Medications    Scheduled Meds:  guaiFENesin  1,200 mg Oral BID   metoprolol tartrate  12.5 mg Oral BID   polyethylene glycol  17 g Oral Daily   sodium chloride flush  3 mL Intravenous Q12H   sodium chloride flush  3 mL Intravenous Q12H   Continuous Infusions:  sodium chloride     sodium chloride     sodium chloride     heparin 1,500 Units/hr (11/04/21 0414)   PRN Meds: sodium chloride, sodium chloride, acetaminophen, ipratropium-albuterol, ondansetron (ZOFRAN) IV, sodium chloride flush, sodium chloride flush   Vital Signs    Vitals:   11/03/21 2006 11/04/21 0308 11/04/21 0444 11/04/21 0835  BP: 128/80 (!) 142/59    Pulse: 86 77    Resp: 20 19    Temp: 98.6 F (37 C) (!) 97.2 F (36.2 C)    TempSrc: Oral Oral    SpO2: 100% 97%  98%  Weight:   131 kg   Height:        Intake/Output Summary (Last 24 hours) at 11/04/2021 0848 Last data filed at 11/04/2021 0414 Gross per 24 hour  Intake 843.45 ml  Output 2750 ml  Net -1906.55 ml   Last 3 Weights 11/04/2021 11/03/2021 11/02/2021  Weight (lbs) 288 lb 12.8 oz 292 lb 14.4 oz 296 lb 11.2 oz  Weight (kg) 131 kg 132.859 kg 134.582 kg      Telemetry    Atrial fibrillation, occasional PVCs, rate in the 70s-80s - Personally Reviewed  ECG    No new tracings since 1/4 - Personally Reviewed  Physical Exam   GEN: No acute distress.   Neck: No JVD Cardiac: Irregular, grade 3/6 murmur heard throughout  Respiratory: Clear to auscultation bilaterally.  Rare crackles in the lung bases that cleared with cough  GI: Soft, nontender, non-distended  MS: No edema; No deformity. Neuro:  Nonfocal  Psych: Normal affect   Labs    High Sensitivity Troponin:   Recent Labs  Lab 11/01/21 1422 11/01/21 1653  TROPONINIHS 23* 25*     Chemistry Recent Labs  Lab 11/01/21 1155 11/01/21 1448 11/02/21 0134 11/03/21 0111 11/04/21 0316  NA 133*   < > 132* 132* 134*  K 3.5   < > 4.2 3.9 4.1  CL 97*  --  99 100 98  CO2 27  --  26 24 26   GLUCOSE 130*  --  134* 111* 94  BUN 11  --  14 18 18   CREATININE 0.88  --  0.76 0.89 0.83  CALCIUM 8.7*  --  8.7* 8.5* 8.4*  PROT 6.8  --   --   --   --   ALBUMIN 3.7  --   --   --   --   AST 32  --   --   --   --   ALT 28  --   --   --   --   ALKPHOS 104  --   --   --   --   BILITOT 2.4*  --   --   --   --  GFRNONAA >60  --  >60 >60 >60  ANIONGAP 9  --  7 8 10    < > = values in this interval not displayed.    Lipids No results for input(s): CHOL, TRIG, HDL, LABVLDL, LDLCALC, CHOLHDL in the last 168 hours.  Hematology Recent Labs  Lab 11/02/21 0134 11/03/21 0111 11/04/21 0316  WBC 4.9 8.8 7.6  RBC 4.00* 4.12* 3.96*  HGB 12.5* 13.0 12.4*  HCT 37.6* 39.2 38.2*  MCV 94.0 95.1 96.5  MCH 31.3 31.6 31.3  MCHC 33.2 33.2 32.5  RDW 13.8 14.0 13.9  PLT 218 197 202   Thyroid No results for input(s): TSH, FREET4 in the last 168 hours.  BNP Recent Labs  Lab 11/01/21 1157  BNP 264.0*    DDimer No results for input(s): DDIMER in the last 168 hours.   Radiology    ECHOCARDIOGRAM COMPLETE  Result Date: 11/02/2021    ECHOCARDIOGRAM REPORT   Patient Name:   SAMIR BUTH Date of Exam: 11/02/2021 Medical Rec #:  [id number]    Height:       77.0 in Accession #:    [id number]   Weight:       296.7 lb Date of Birth:  May 08, 1927    BSA:          2.645 m Patient Age:    82 years     BP:           134/90 mmHg Patient Gender: M            HR:           74 bpm. Exam Location:  Inpatient Procedure: 2D Echo, Cardiac  Doppler and Color Doppler Indications:    CHF-Acute Diastolic [id number]  History:        Patient has no prior history of Echocardiogram examinations.  Sonographer:    Bernadene Person RDCS Referring Phys: [id number] Cassandra  1. The aortic valve is incompletely visualized on SAX view, however, suspect severe (vs moderate-to-severe) paradoxical low flow, low gradient aortic stenosis with AVA 0.8cm2 by continuity, mean gradient 54mmHg, Vmax 3.72m/s, DI 0.23, SVi 22. Notably the  LVOT VTI may be underestimated due to suboptimal doppler angle.  2. Left ventricular ejection fraction, by estimation, is 55 to 60%. The left ventricle has normal function. The left ventricle has no regional wall motion abnormalities. There is mild asymmetric left ventricular hypertrophy of the basal-septal segment. Left ventricular diastolic parameters are consistent with Grade II diastolic dysfunction (pseudonormalization).  3. Right ventricular systolic function is mildly reduced. The right ventricular size is normal. There is mildly elevated pulmonary artery systolic pressure.  4. Left atrial size was mildly dilated.  5. The mitral valve is degenerative. Mild mitral valve regurgitation. Moderate mitral annular calcification.  6. The aortic valve is tricuspid. There is moderate calcification of the aortic valve. There is moderate thickening of the aortic valve. Aortic valve regurgitation is mild. Severe aortic valve stenosis.  7. Aortic dilatation noted. There is moderate dilatation of the ascending aorta, measuring 44 mm.  8. The inferior vena cava is dilated in size with <50% respiratory variability, suggesting right atrial pressure of 15 mmHg. Comparison(s): No prior Echocardiogram. FINDINGS  Left Ventricle: Left ventricular ejection fraction, by estimation, is 55 to 60%. The left ventricle has normal function. The left ventricle has no regional wall motion abnormalities. The left ventricular internal cavity size was normal in  size. There is  mild asymmetric left ventricular hypertrophy of the basal-septal segment.  Left ventricular diastolic parameters are consistent with Grade II diastolic dysfunction (pseudonormalization). Right Ventricle: The right ventricular size is normal. Right vetricular wall thickness was not well visualized. Right ventricular systolic function is mildly reduced. There is mildly elevated pulmonary artery systolic pressure. The tricuspid regurgitant velocity is 2.69 m/s, and with an assumed right atrial pressure of 15 mmHg, the estimated right ventricular systolic pressure is 43.9 mmHg. Left Atrium: Left atrial size was mildly dilated. Right Atrium: Right atrial size was normal in size. Pericardium: There is no evidence of pericardial effusion. Mitral Valve: The mitral valve is degenerative in appearance. There is mild thickening of the mitral valve leaflet(s). There is mild calcification of the mitral valve leaflet(s). Moderate mitral annular calcification. Mild mitral valve regurgitation. Tricuspid Valve: The tricuspid valve is normal in structure. Tricuspid valve regurgitation is mild. Aortic Valve: The aortic valve is tricuspid. There is moderate calcification of the aortic valve. There is moderate thickening of the aortic valve. Aortic valve regurgitation is mild. Aortic regurgitation PHT measures 500 msec. Severe aortic stenosis is present. Aortic valve mean gradient measures 22.0 mmHg. Aortic valve peak gradient measures 32.8 mmHg. Aortic valve area, by VTI measures 0.83 cm. Pulmonic Valve: The pulmonic valve was not well visualized. Pulmonic valve regurgitation is trivial. Aorta: The aortic root is normal in size and structure and aortic dilatation noted. There is moderate dilatation of the ascending aorta, measuring 44 mm. Venous: The inferior vena cava is dilated in size with less than 50% respiratory variability, suggesting right atrial pressure of 15 mmHg. IAS/Shunts: The atrial septum is grossly  normal.  LEFT VENTRICLE PLAX 2D LVIDd:         4.70 cm      Diastology LVIDs:         3.10 cm      LV e' medial:    4.60 cm/s LV PW:         1.00 cm      LV E/e' medial:  33.0 LV IVS:        0.90 cm      LV e' lateral:   6.05 cm/s LVOT diam:     2.10 cm      LV E/e' lateral: 25.1 LV SV:         58 LV SV Index:   22 LVOT Area:     3.46 cm  LV Volumes (MOD) LV vol d, MOD A2C: 68.4 ml LV vol d, MOD A4C: 127.0 ml LV vol s, MOD A2C: 29.4 ml LV vol s, MOD A4C: 55.2 ml LV SV MOD A2C:     39.0 ml LV SV MOD A4C:     127.0 ml LV SV MOD BP:      53.0 ml RIGHT VENTRICLE RV S prime:     7.76 cm/s TAPSE (M-mode): 1.7 cm LEFT ATRIUM             Index        RIGHT ATRIUM           Index LA diam:        4.40 cm 1.66 cm/m   RA Area:     17.70 cm LA Vol (A2C):   87.4 ml 33.04 ml/m  RA Volume:   45.00 ml  17.01 ml/m LA Vol (A4C):   95.4 ml 36.07 ml/m LA Biplane Vol: 93.0 ml 35.16 ml/m  AORTIC VALVE AV Area (Vmax):    0.93 cm AV Area (Vmean):   0.83 cm AV Area (VTI):  0.83 cm AV Vmax:           286.33 cm/s AV Vmean:          222.667 cm/s AV VTI:            0.702 m AV Peak Grad:      32.8 mmHg AV Mean Grad:      22.0 mmHg LVOT Vmax:         77.17 cm/s LVOT Vmean:        53.333 cm/s LVOT VTI:          0.168 m LVOT/AV VTI ratio: 0.24 AI PHT:            500 msec  AORTA Ao Root diam: 3.50 cm Ao Asc diam:  4.40 cm MITRAL VALVE                TRICUSPID VALVE MV Area (PHT): 3.77 cm     TR Peak grad:   28.9 mmHg MV Decel Time: 201 msec     TR Vmax:        269.00 cm/s MR Peak grad: 112.8 mmHg MR Mean grad: 73.0 mmHg     SHUNTS MR Vmax:      531.00 cm/s   Systemic VTI:  0.17 m MR Vmean:     411.0 cm/s    Systemic Diam: 2.10 cm MV E velocity: 152.00 cm/s MV A velocity: 85.00 cm/s MV E/A ratio:  1.79 Gwyndolyn Kaufman MD Electronically signed by Gwyndolyn Kaufman MD Signature Date/Time: 11/02/2021/4:50:05 PM    Final     Cardiac Studies   Echo 11/02/21  1. The aortic valve is incompletely visualized on SAX view, however,  suspect  severe (vs moderate-to-severe) paradoxical low flow, low gradient  aortic stenosis with AVA 0.8cm2 by continuity, mean gradient 67mmHg, Vmax  3.39m/s, DI 0.23, SVi 22. Notably the   LVOT VTI may be underestimated due to suboptimal doppler angle.   2. Left ventricular ejection fraction, by estimation, is 55 to 60%. The  left ventricle has normal function. The left ventricle has no regional  wall motion abnormalities. There is mild asymmetric left ventricular  hypertrophy of the basal-septal segment.  Left ventricular diastolic parameters are consistent with Grade II  diastolic dysfunction (pseudonormalization).   3. Right ventricular systolic function is mildly reduced. The right  ventricular size is normal. There is mildly elevated pulmonary artery  systolic pressure.   4. Left atrial size was mildly dilated.   5. The mitral valve is degenerative. Mild mitral valve regurgitation.  Moderate mitral annular calcification.   6. The aortic valve is tricuspid. There is moderate calcification of the  aortic valve. There is moderate thickening of the aortic valve. Aortic  valve regurgitation is mild. Severe aortic valve stenosis.   7. Aortic dilatation noted. There is moderate dilatation of the ascending  aorta, measuring 44 mm.   8. The inferior vena cava is dilated in size with <50% respiratory  variability, suggesting right atrial pressure of 15 mmHg.   Patient Profile     88 y.o. male with a hx of chronic diastolic heart failure, moderate aortic stenosis, moderate mitral regurgitation, paroxysmal atrial fibrillation who is being seen 11/03/2021 for the evaluation of heart failure at the request of Dr. Broadus John. Echo on 1/4 showed severe aortic stenosis and patient is in the process of being evaluated for a TAVR by the structural heart team.   Assessment & Plan    Severe Aortic Stenosis: Aortic stenosis was identified as moderate  on echo in 2015. Echo from 11/02/21 showed severe low flow, low  gradient aortic stenosis. - Currently being evaluated by the structural heart team for TAVR  - L/RHC planned for today at 1:30 with Dr. Burt Knack  - Outpatient follow up arranged by structural heart team    Acute hypoxic respiratory failure in the setting of acute on chronic diastolic CHF  - Echocardiogram on 11/02/21 showed grade II diastolic dysfunction, EF 0000000, severe aortic stenosis, moderate mitral regurgitation  - Patient was hypoxic on presentation, O2 sats improved with oxygen. Patient is currently on room air and doing well  - Patient appeared fluid overloaded on presentation per H&P from hospitalist, patient was on lasix 20mg  IV BID (Received 5 doses total, last dose was yesterday afternoon 1/5). Currently net -1.08 L. Output 4.175 L yesterday, 4.459 L overnight. Weight decreased from 134.7 kg- 131 kg since admission. Creatinine stable at 0.83  - Patient euvolemic on exam. Continue to hold lasix  - Plan to continue BB outpatient    Atrial Fibrillation  - First identified in 2014, patient was seeing a cardiologist but never returned for follow up appointments  - Currently on IV heparin. Patient has refused anticoagulation in the past, but is now agreeable. Plan to transition to eliquis following completion of all planned procedures.   - Continue metoprolol tartate 12.5 mg BID as started by the hospitalist service. Patient had been prescribed metoprolol in the past, but he decided to stop taking it. Currently not taking any rate control medications at home  - Plan to continue metoprolol 12.5 mg BID and anticoagulation with DOAC once discharged        For questions or updates, please contact [place] Please consult [email or web address] for contact info under        Signed, Margie Billet, PA-C  11/04/2021, 8:48 AM

## 2021-11-04 NOTE — Care Management Important Message (Signed)
Important Message  Patient Details  Name: Louis Koch MRN: [id number] Date of Birth: [birth year]   Medicare Important Message Given:  Yes     Shelda Altes 11/04/2021, 10:19 AM

## 2021-11-04 NOTE — Interval H&P Note (Signed)
History and Physical Interval Note:  11/04/2021 1:58 PM  Louis Koch  has presented today for surgery, with the diagnosis of Aortic stenosis.  The various methods of treatment have been discussed with the patient and family. After consideration of risks, benefits and other options for treatment, the patient has consented to  Procedure(s): RIGHT/LEFT HEART CATH AND CORONARY ANGIOGRAPHY (N/A) as a surgical intervention.  The patient's history has been reviewed, patient examined, no change in status, stable for surgery.  I have reviewed the patient's chart and labs.  Questions were answered to the patient's satisfaction.     Tonny Bollman

## 2021-11-05 ENCOUNTER — Inpatient Hospital Stay (HOSPITAL_COMMUNITY): Payer: Medicare Other

## 2021-11-05 DIAGNOSIS — E871 Hypo-osmolality and hyponatremia: Secondary | ICD-10-CM | POA: Diagnosis present

## 2021-11-05 DIAGNOSIS — J9601 Acute respiratory failure with hypoxia: Secondary | ICD-10-CM | POA: Diagnosis present

## 2021-11-05 DIAGNOSIS — I4891 Unspecified atrial fibrillation: Secondary | ICD-10-CM | POA: Diagnosis present

## 2021-11-05 DIAGNOSIS — I5031 Acute diastolic (congestive) heart failure: Secondary | ICD-10-CM | POA: Diagnosis present

## 2021-11-05 LAB — CBC
HCT: 39.8 % (ref 39.0–52.0)
Hemoglobin: 13 g/dL (ref 13.0–17.0)
MCH: 31.3 pg (ref 26.0–34.0)
MCHC: 32.7 g/dL (ref 30.0–36.0)
MCV: 95.9 fL (ref 80.0–100.0)
Platelets: 198 10*3/uL (ref 150–400)
RBC: 4.15 MIL/uL — ABNORMAL LOW (ref 4.22–5.81)
RDW: 13.9 % (ref 11.5–15.5)
WBC: 7.5 10*3/uL (ref 4.0–10.5)
nRBC: 0 % (ref 0.0–0.2)

## 2021-11-05 LAB — BASIC METABOLIC PANEL
Anion gap: 9 (ref 5–15)
BUN: 15 mg/dL (ref 8–23)
CO2: 26 mmol/L (ref 22–32)
Calcium: 8.9 mg/dL (ref 8.9–10.3)
Chloride: 98 mmol/L (ref 98–111)
Creatinine, Ser: 0.74 mg/dL (ref 0.61–1.24)
GFR, Estimated: >60 mL/min (ref >60)
Glucose, Bld: 101 mg/dL — ABNORMAL HIGH (ref 70–99)
Potassium: 4.5 mmol/L (ref 3.5–5.1)
Sodium: 133 mmol/L — ABNORMAL LOW (ref 135–145)

## 2021-11-05 MED ORDER — FUROSEMIDE 10 MG/ML IJ SOLN
40.0000 mg | Freq: Two times a day (BID) | INTRAMUSCULAR | Status: DC
  Administered 2021-11-05 – 2021-11-06 (×3): 40 mg via INTRAVENOUS
  Filled 2021-11-05 (×3): qty 4

## 2021-11-05 MED ORDER — FUROSEMIDE 10 MG/ML IJ SOLN
40.0000 mg | Freq: Two times a day (BID) | INTRAMUSCULAR | Status: DC
Start: 2021-11-05 — End: 2021-11-05

## 2021-11-05 MED ORDER — IOHEXOL 350 MG/ML SOLN
100.0000 mL | Freq: Once | INTRAVENOUS | Status: AC | PRN
Start: 2021-11-05 — End: 2021-11-05
  Administered 2021-11-05: 100 mL via INTRAVENOUS

## 2021-11-05 MED ORDER — METOPROLOL TARTRATE 25 MG PO TABS: 25.0000 mg | ORAL_TABLET | Freq: Once | ORAL | Status: AC

## 2021-11-05 MED ORDER — METOPROLOL TARTRATE 5 MG/5ML IV SOLN
INTRAVENOUS | Status: AC
  Filled 2021-11-05: qty 10

## 2021-11-05 MED ORDER — METOPROLOL TARTRATE 5 MG/5ML IV SOLN
10.0000 mg | INTRAVENOUS | Status: DC | PRN
  Administered 2021-11-05 (×2): 10 mg via INTRAVENOUS
  Filled 2021-11-05: qty 10

## 2021-11-05 NOTE — Discharge Instructions (Addendum)
Do the following things EVERY DAY:  Weigh yourself EVERY morning after you go to the bathroom but before you eat or drink anything. Write this number down in a weight log/diary. If you gain 3 pounds overnight or 5 pounds in a week, call the office.  Take your medicines as prescribed. If you have concerns about your medications, please call us before you stop taking them.   Eat low salt foods--Limit salt (sodium) to 2000 mg per day. This will help prevent your body from holding onto fluid. Read food labels as many processed foods have a lot of sodium, especially canned goods and prepackaged meats. If you would like some assistance choosing low sodium foods, we would be happy to set you up with a nutritionist.  Stay as active as you can everyday. Staying active will give you more energy and make your muscles stronger. Start with 5 minutes at a time and work your way up to 30 minutes a day. Break up your activities--do some in the morning and some in the afternoon. Start with 3 days per week and work your way up to 5 days as you can.  If you have chest pain, feel short of breath, dizzy, or lightheaded, STOP. If you don't feel better after a short rest, call 911. If you do feel better, call the office to let us know you have symptoms with exercise.  Limit all fluids for the day to less than 2 liters. Fluid includes all drinks, coffee, juice, ice chips, soup, jello, and all other liquids.   Information on my medicine - ELIQUIS (apixaban)  Why was Eliquis prescribed for you? Eliquis was prescribed for you to reduce the risk of forming blood clots that can cause a stroke if you have a medical condition called atrial fibrillation (a type of irregular heartbeat) OR to reduce the risk of a blood clots forming after orthopedic surgery.  What do You need to know about Eliquis ? Take your Eliquis TWICE DAILY - one tablet in the morning and one tablet in the evening with or without food.  It would be best  to take the doses about the same time each day.  If you have difficulty swallowing the tablet whole please discuss with your pharmacist how to take the medication safely.  Take Eliquis exactly as prescribed by your doctor and DO NOT stop taking Eliquis without talking to the doctor who prescribed the medication.  Stopping may increase your risk of developing a new clot or stroke.  Refill your prescription before you run out.  After discharge, you should have regular check-up appointments with your healthcare provider that is prescribing your Eliquis.  In the future your dose may need to be changed if your kidney function or weight changes by a significant amount or as you get older.  What do you do if you miss a dose? If you miss a dose, take it as soon as you remember on the same day and resume taking twice daily.  Do not take more than one dose of ELIQUIS at the same time.  Important Safety Information A possible side effect of Eliquis is bleeding. You should call your healthcare provider right away if you experience any of the following: Bleeding from an injury or your nose that does not stop. Unusual colored urine (red or dark brown) or unusual colored stools (red or black). Unusual bruising for unknown reasons. A serious fall or if you hit your head (even if there is no bleeding).  Some medicines may interact with Eliquis and might increase your risk of bleeding or clotting while on Eliquis. To help avoid this, consult your healthcare provider or pharmacist prior to using any new prescription or non-prescription medications, including herbals, vitamins, non-steroidal anti-inflammatory drugs (NSAIDs) and supplements.  This website has more information on Eliquis (apixaban): [email or web address].[email or web address].

## 2021-11-05 NOTE — Progress Notes (Signed)
PROGRESS NOTE    Louis Koch  [id number] DOB: 1926-11-02 DOA: 11/01/2021 PCP: Pcp, No  Brief Narrative: 89/M with history of paroxysmal atrial fibrillation, not on anticoagulation, moderate aortic stenosis, moderate MR presented to the ED with shortness of breath.  Patient has not seen a medical doctor in over 5+ years, reports progressive dyspnea on exertion for 1 month, significantly worse in the last 3 days with orthopnea.  Subsequently EMS was called, upon arrival O2 sats were in the 70s with wheezing and respiratory distress, he was placed on a nonrebreather, given nebulizations and brought to the ED. -In the emergency room he was noted to be in distress, chest x-ray noted cardiomegaly and interstitial edema   Assessment & Plan:  Acute hypoxic respiratory failure Acute diastolic CHF Severe aortic stenosis -Volume status is improving,  -2D echo noted preserved EF, grade 2 diastolic dysfunction, severe aortic stenosis  -Cardiology consulting, TAVR work-up ongoing, underwent left heart cath yesterday which showed moderate nonobstructive CAD,  filling pressures are high, Lasix resumed -CTAs of chest, abdomen pelvis etc. ordered as part of TAVR work-up  -Wean O2, ambulate, home in 1 to 2 days -BMP in a.m.  A. fib RVR -Continue low-dose metoprolol -Off IV heparin, started on Eliquis today  Hyponatremia -Secondary to hypovolemic state, monitor with diuresis  DVT prophylaxis: Eliquis Code Status: Full code Family Communication: Discussed with patient and friend at bedside Disposition Plan: Home pending above work-up Status is: Inpatient  Remains inpatient appropriate because: Severity of illness  Consultants:  Cardiology  Procedures:   Echo noted severe aortic stenosis, preserved ejection fraction  Left  heart cath    Mid RCA lesion is 50% stenosed.   Mid LAD lesion is 40% stenosed.   1.  Mild to moderate nonobstructive coronary artery disease with moderate ostial RCA  stenosis, mild to moderate mid RCA stenosis, and an otherwise patent right coronary artery. 2.  Patent left main with mild plaquing 3.  Patent LAD with mild mid vessel stenosis of 40% 4.  Patent left circumflex with mild luminal irregularity. 5.  Calcified, restricted aortic valve with peak to peak gradient 24 mmHg 6.  Elevated intracardiac filling pressures with mean wedge pressure of 27, mean PA pressure of 48, and right atrial mean pressure of 16.   Recommend: Continued TAVR evaluation, continued IV diuresis for treatment of heart failure, okay to start apixaban tomorrow morning.    Subjective: -Feels better overall, breathing is improving  Objective: Vitals:   11/05/21 0018 11/05/21 0352 11/05/21 0730 11/05/21 0900  BP: (!) 152/91 (!) 141/77 125/76 104/82  Pulse:   60   Resp:  20 19   Temp: 97.6 F (36.4 C) 98 F (36.7 C)    TempSrc: Oral Oral    SpO2: 100% 99% 99%   Weight:  128.7 kg    Height:        Intake/Output Summary (Last 24 hours) at 11/05/2021 1109 Last data filed at 11/05/2021 0900 Gross per 24 hour  Intake 360 ml  Output 3100 ml  Net -2740 ml   Filed Weights   11/03/21 0416 11/04/21 0444 11/05/21 0352  Weight: 132.9 kg 131 kg 128.7 kg    Examination:  General exam: Pleasant elderly male sitting up in bed, AAOx3, no distress HEENT: Positive JVD CVS: S1-S2, regular rhythm, systolic murmur Lungs: Few basilar rales Abdomen: Soft, nontender, bowel sounds present Extremities: No edema  Skin: No rash on exposed skin Psych: Appropriate mood and affect   Data Reviewed:  CBC: Recent Labs  Lab 11/01/21 1155 11/01/21 1448 11/02/21 0134 11/03/21 0111 11/04/21 0316 11/04/21 1423 11/04/21 1438 11/05/21 0338  WBC 6.0  --  4.9 8.8 7.6  --   --  7.5  HGB 13.5   < > 12.5* 13.0 12.4* 12.9* 12.9* 13.0  HCT 41.0   < > 37.6* 39.2 38.2* 38.0* 38.0* 39.8  MCV 95.6  --  94.0 95.1 96.5  --   --  95.9  PLT 212  --  218 197 202  --   --  198   < > = values in  this interval not displayed.   Basic Metabolic Panel: Recent Labs  Lab 11/01/21 1155 11/01/21 1448 11/02/21 0134 11/03/21 0111 11/04/21 0316 11/04/21 1423 11/04/21 1438 11/05/21 0338  NA 133*   < > 132* 132* 134* 137 137 133*  K 3.5   < > 4.2 3.9 4.1 4.2 4.2 4.5  CL 97*  --  99 100 98  --   --  98  CO2 27  --  26 24 26   --   --  26  GLUCOSE 130*  --  134* 111* 94  --   --  101*  BUN 11  --  14 18 18   --   --  15  CREATININE 0.88  --  0.76 0.89 0.83  --   --  0.74  CALCIUM 8.7*  --  8.7* 8.5* 8.4*  --   --  8.9   < > = values in this interval not displayed.   GFR: Estimated Creatinine Clearance: 87.4 mL/min (by C-G formula based on SCr of 0.74 mg/dL). Liver Function Tests: Recent Labs  Lab 11/01/21 1155  AST 32  ALT 28  ALKPHOS 104  BILITOT 2.4*  PROT 6.8  ALBUMIN 3.7   No results for input(s): LIPASE, AMYLASE in the last 168 hours. No results for input(s): AMMONIA in the last 168 hours. Coagulation Profile: No results for input(s): INR, PROTIME in the last 168 hours. Cardiac Enzymes: No results for input(s): CKTOTAL, CKMB, CKMBINDEX, TROPONINI in the last 168 hours. BNP (last 3 results) No results for input(s): PROBNP in the last 8760 hours. HbA1C: No results for input(s): HGBA1C in the last 72 hours. CBG: No results for input(s): GLUCAP in the last 168 hours. Lipid Profile: No results for input(s): CHOL, HDL, LDLCALC, TRIG, CHOLHDL, LDLDIRECT in the last 72 hours. Thyroid Function Tests: No results for input(s): TSH, T4TOTAL, FREET4, T3FREE, THYROIDAB in the last 72 hours. Anemia Panel: No results for input(s): VITAMINB12, FOLATE, FERRITIN, TIBC, IRON, RETICCTPCT in the last 72 hours. Urine analysis: No results found for: COLORURINE, APPEARANCEUR, LABSPEC, PHURINE, GLUCOSEU, HGBUR, BILIRUBINUR, KETONESUR, PROTEINUR, UROBILINOGEN, NITRITE, LEUKOCYTESUR Sepsis Labs: @LABRCNTIP (procalcitonin:4,lacticidven:4)  ) Recent Results (from the past 240 hour(s))   Resp Panel by RT-PCR (Flu A&B, Covid) Nasopharyngeal Swab     Status: None   Collection Time: 11/01/21 11:30 AM   Specimen: Nasopharyngeal Swab; Nasopharyngeal(NP) swabs in vial transport medium  Result Value Ref Range Status   SARS Coronavirus 2 by RT PCR NEGATIVE NEGATIVE Final    Comment: (NOTE) SARS-CoV-2 target nucleic acids are NOT DETECTED.  The SARS-CoV-2 RNA is generally detectable in upper respiratory specimens during the acute phase of infection. The lowest concentration of SARS-CoV-2 viral copies this assay can detect is 138 copies/mL. A negative result does not preclude SARS-Cov-2 infection and should not be used as the sole basis for treatment or other patient management decisions. A negative result may  occur with  improper specimen collection/handling, submission of specimen other than nasopharyngeal swab, presence of viral mutation(s) within the areas targeted by this assay, and inadequate number of viral copies(<138 copies/mL). A negative result must be combined with clinical observations, patient history, and epidemiological information. The expected result is Negative.  Fact Sheet for Patients:  EntrepreneurPulse.com.au  Fact Sheet for Healthcare Providers:  IncredibleEmployment.be  This test is no t yet approved or cleared by the [place] FDA and  has been authorized for detection and/or diagnosis of SARS-CoV-2 by FDA under an Emergency Use Authorization (EUA). This EUA will remain  in effect (meaning this test can be used) for the duration of the COVID-19 declaration under Section 564(b)(1) of the Act, 21 U.S.C.section 360bbb-3(b)(1), unless the authorization is terminated  or revoked sooner.       Influenza A by PCR NEGATIVE NEGATIVE Final   Influenza B by PCR NEGATIVE NEGATIVE Final    Comment: (NOTE) The Xpert Xpress SARS-CoV-2/FLU/RSV plus assay is intended as an aid in the diagnosis of influenza from  Nasopharyngeal swab specimens and should not be used as a sole basis for treatment. Nasal washings and aspirates are unacceptable for Xpert Xpress SARS-CoV-2/FLU/RSV testing.  Fact Sheet for Patients: EntrepreneurPulse.com.au  Fact Sheet for Healthcare Providers: IncredibleEmployment.be  This test is not yet approved or cleared by the [place] FDA and has been authorized for detection and/or diagnosis of SARS-CoV-2 by FDA under an Emergency Use Authorization (EUA). This EUA will remain in effect (meaning this test can be used) for the duration of the COVID-19 declaration under Section 564(b)(1) of the Act, 21 U.S.C. section 360bbb-3(b)(1), unless the authorization is terminated or revoked.  Performed at [facility], [place] [address], [place], [place] [address]          Radiology Studies: CARDIAC CATHETERIZATION  Result Date: 11/04/2021   Mid RCA lesion is 50% stenosed.   Mid LAD lesion is 40% stenosed. 1.  Mild to moderate nonobstructive coronary artery disease with moderate ostial RCA stenosis, mild to moderate mid RCA stenosis, and an otherwise patent right coronary artery. 2.  Patent left main with mild plaquing 3.  Patent LAD with mild mid vessel stenosis of 40% 4.  Patent left circumflex with mild luminal irregularity. 5.  Calcified, restricted aortic valve with peak to peak gradient 24 mmHg 6.  Elevated intracardiac filling pressures with mean wedge pressure of 27, mean PA pressure of 48, and right atrial mean pressure of 16. Recommend: Continued TAVR evaluation, continued IV diuresis for treatment of heart failure, okay to start apixaban tomorrow morning.     Scheduled Meds:  apixaban  5 mg Oral BID   furosemide  40 mg Intravenous BID   guaiFENesin  1,200 mg Oral BID   metoprolol tartrate  12.5 mg Oral BID   polyethylene glycol  17 g Oral Daily   sodium chloride flush  3 mL Intravenous Q12H   Continuous Infusions:  sodium  chloride     sodium chloride       LOS: 4 days    Time spent: 25min    Domenic Polite, MD [facility]   11/05/2021, 11:09 AM

## 2021-11-05 NOTE — Hospital Course (Signed)
89/M with history of paroxysmal atrial fibrillation, not on anticoagulation, moderate aortic stenosis, moderate MR presented to the ED with shortness of breath.  Patient has not seen a medical doctor in over 5+ years, reports progressive dyspnea on exertion for 1 month, significantly worse in the last 3 days with orthopnea.  Subsequently EMS was called, upon arrival O2 sats were in the 70s with wheezing and respiratory distress, he was placed on a nonrebreather, given nebulizations and brought to the ED. -In the emergency room he was noted to be in distress, chest x-ray noted cardiomegaly and interstitial edema -on further workup, he was noted to have Severe AS, now TAVR workup ongoing

## 2021-11-05 NOTE — Assessment & Plan Note (Signed)
Secondary to hypovolemic state, monitor with diuresis

## 2021-11-05 NOTE — Assessment & Plan Note (Signed)
Continue low-dose metoprolol -Off IV heparin, started on Eliquis today

## 2021-11-05 NOTE — Care Management (Signed)
Provided patient with 30 day Eliquis card 

## 2021-11-05 NOTE — Assessment & Plan Note (Signed)
Acute Diastolic CHF Severe Aortic stenosis -Volume status is improving,  -2D echo noted preserved EF, grade 2 diastolic dysfunction, severe aortic stenosis  -Cardiology consulting, TAVR work-up ongoing, underwent left heart cath yesterday which showed moderate nonobstructive CAD,  filling pressures are high, Lasix resumed -CTAs of chest, abdomen pelvis etc. ordered as part of TAVR work-up  -Wean O2, ambulate, home in 1 to 2 days -BMP in a.m.

## 2021-11-06 DIAGNOSIS — I4891 Unspecified atrial fibrillation: Secondary | ICD-10-CM | POA: Diagnosis not present

## 2021-11-06 DIAGNOSIS — I5031 Acute diastolic (congestive) heart failure: Secondary | ICD-10-CM | POA: Diagnosis not present

## 2021-11-06 DIAGNOSIS — I350 Nonrheumatic aortic (valve) stenosis: Secondary | ICD-10-CM | POA: Diagnosis not present

## 2021-11-06 LAB — BASIC METABOLIC PANEL
Anion gap: 7 (ref 5–15)
BUN: 14 mg/dL (ref 8–23)
CO2: 33 mmol/L — ABNORMAL HIGH (ref 22–32)
Calcium: 8.9 mg/dL (ref 8.9–10.3)
Chloride: 94 mmol/L — ABNORMAL LOW (ref 98–111)
Creatinine, Ser: 0.91 mg/dL (ref 0.61–1.24)
GFR, Estimated: >60 mL/min (ref >60)
Glucose, Bld: 107 mg/dL — ABNORMAL HIGH (ref 70–99)
Potassium: 4 mmol/L (ref 3.5–5.1)
Sodium: 134 mmol/L — ABNORMAL LOW (ref 135–145)

## 2021-11-06 MED ORDER — FUROSEMIDE 40 MG PO TABS
40.0000 mg | ORAL_TABLET | Freq: Every day | ORAL | Status: DC
  Administered 2021-11-06 – 2021-11-07 (×2): 40 mg via ORAL
  Filled 2021-11-06 (×2): qty 1

## 2021-11-06 NOTE — Progress Notes (Signed)
Progress Note  Patient Name: Louis Koch Date of Encounter: 11/06/2021  [facility] Cardiologist: Jodelle Red, MD   Subjective   Feeling well this AM. We reviewed how AS causes fluid accumulation and how to watch for this at home. He is planning to get up and walk shortly. He has a history of sleep apnea and has declined CPAP, asking if he can have a home O2 tank for sleep as needed, reviewed the process for this. He has had excellent urine output on IV lasix, discussed changing to oral today.  Inpatient Medications    Scheduled Meds:  apixaban  5 mg Oral BID   furosemide  40 mg Oral Daily   guaiFENesin  1,200 mg Oral BID   metoprolol tartrate  12.5 mg Oral BID   polyethylene glycol  17 g Oral Daily   sodium chloride flush  3 mL Intravenous Q12H   Continuous Infusions:  sodium chloride     sodium chloride     PRN Meds: sodium chloride, sodium chloride, acetaminophen, ipratropium-albuterol, ondansetron (ZOFRAN) IV, sodium chloride flush   Vital Signs    Vitals:   11/05/21 1656 11/05/21 1947 11/06/21 0500 11/06/21 0806  BP: (!) 122/95 119/71  112/62  Pulse:  69  79  Resp:  18  18  Temp:  97.7 F (36.5 C)  98.1 F (36.7 C)  TempSrc:  Oral  Oral  SpO2:  98%  97%  Weight:   125 kg   Height:        Intake/Output Summary (Last 24 hours) at 11/06/2021 1050 Last data filed at 11/06/2021 0500 Gross per 24 hour  Intake 240 ml  Output 1775 ml  Net -1535 ml   Last 3 Weights 11/06/2021 11/05/2021 11/04/2021  Weight (lbs) 275 lb 9.2 oz 283 lb 11.7 oz 288 lb 12.8 oz  Weight (kg) 125 kg 128.7 kg 131 kg      Telemetry    Atrial fibrillation - Personally Reviewed  ECG    No new since 11/01/21 - Personally Reviewed  Physical Exam   GEN: Well nourished, well developed in no acute distress NECK: No JVD CARDIAC: irregularly irregular rhythm, normal S1 and S2, no rubs or gallops. 3/6 systolic murmur. VASCULAR: Radial pulses 2+ bilaterally.  RESPIRATORY:  Clear to  auscultation without rales, wheezing or rhonchi  ABDOMEN: Soft, non-tender, non-distended MUSCULOSKELETAL:  Moves all 4 limbs independently SKIN: Warm and dry, trivial bilateral LE edema NEUROLOGIC:  No focal neuro deficits noted. PSYCHIATRIC:  Normal affect    Labs    High Sensitivity Troponin:   Recent Labs  Lab 11/01/21 1422 11/01/21 1653  TROPONINIHS 23* 25*     Chemistry Recent Labs  Lab 11/01/21 1155 11/01/21 1448 11/04/21 0316 11/04/21 1423 11/04/21 1438 11/05/21 0338 11/06/21 0405  NA 133*   < > 134*   < > 137 133* 134*  K 3.5   < > 4.1   < > 4.2 4.5 4.0  CL 97*   < > 98  --   --  98 94*  CO2 27   < > 26  --   --  26 33*  GLUCOSE 130*   < > 94  --   --  101* 107*  BUN 11   < > 18  --   --  15 14  CREATININE 0.88   < > 0.83  --   --  0.74 0.91  CALCIUM 8.7*   < > 8.4*  --   --  8.9 8.9  PROT 6.8  --   --   --   --   --   --   ALBUMIN 3.7  --   --   --   --   --   --   AST 32  --   --   --   --   --   --   ALT 28  --   --   --   --   --   --   ALKPHOS 104  --   --   --   --   --   --   BILITOT 2.4*  --   --   --   --   --   --   GFRNONAA >60   < > >60  --   --  >60 >60  ANIONGAP 9   < > 10  --   --  9 7   < > = values in this interval not displayed.    Lipids No results for input(s): CHOL, TRIG, HDL, LABVLDL, LDLCALC, CHOLHDL in the last 168 hours.  Hematology Recent Labs  Lab 11/03/21 0111 11/04/21 0316 11/04/21 1423 11/04/21 1438 11/05/21 0338  WBC 8.8 7.6  --   --  7.5  RBC 4.12* 3.96*  --   --  4.15*  HGB 13.0 12.4* 12.9* 12.9* 13.0  HCT 39.2 38.2* 38.0* 38.0* 39.8  MCV 95.1 96.5  --   --  95.9  MCH 31.6 31.3  --   --  31.3  MCHC 33.2 32.5  --   --  32.7  RDW 14.0 13.9  --   --  13.9  PLT 197 202  --   --  198   Thyroid No results for input(s): TSH, FREET4 in the last 168 hours.  BNP Recent Labs  Lab 11/01/21 1157  BNP 264.0*    DDimer No results for input(s): DDIMER in the last 168 hours.   Radiology    CARDIAC  CATHETERIZATION  Result Date: 11/04/2021   Mid RCA lesion is 50% stenosed.   Mid LAD lesion is 40% stenosed. 1.  Mild to moderate nonobstructive coronary artery disease with moderate ostial RCA stenosis, mild to moderate mid RCA stenosis, and an otherwise patent right coronary artery. 2.  Patent left main with mild plaquing 3.  Patent LAD with mild mid vessel stenosis of 40% 4.  Patent left circumflex with mild luminal irregularity. 5.  Calcified, restricted aortic valve with peak to peak gradient 24 mmHg 6.  Elevated intracardiac filling pressures with mean wedge pressure of 27, mean PA pressure of 48, and right atrial mean pressure of 16. Recommend: Continued TAVR evaluation, continued IV diuresis for treatment of heart failure, okay to start apixaban tomorrow morning.    Cardiac Studies   Echo 11/02/21  1. The aortic valve is incompletely visualized on SAX view, however,  suspect severe (vs moderate-to-severe) paradoxical low flow, low gradient  aortic stenosis with AVA 0.8cm2 by continuity, mean gradient , Vmax  3.40m/s, DI 0.23, SVi 22. Notably the   LVOT VTI may be underestimated due to suboptimal doppler angle.   2. Left ventricular ejection fraction, by estimation, is 55 to 60%. The  left ventricle has normal function. The left ventricle has no regional  wall motion abnormalities. There is mild asymmetric left ventricular  hypertrophy of the basal-septal segment.  Left ventricular diastolic parameters are consistent with Grade II  diastolic dysfunction (pseudonormalization).   3. Right ventricular systolic  function is mildly reduced. The right  ventricular size is normal. There is mildly elevated pulmonary artery  systolic pressure.   4. Left atrial size was mildly dilated.   5. The mitral valve is degenerative. Mild mitral valve regurgitation.  Moderate mitral annular calcification.   6. The aortic valve is tricuspid. There is moderate calcification of the  aortic valve. There is  moderate thickening of the aortic valve. Aortic  valve regurgitation is mild. Severe aortic valve stenosis.   7. Aortic dilatation noted. There is moderate dilatation of the ascending  aorta, measuring 44 mm.   8. The inferior vena cava is dilated in size with <50% respiratory  variability, suggesting right atrial pressure of 15 mmHg.   Patient Profile     88 y.o. male with a hx of chronic diastolic heart failure, moderate aortic stenosis, moderate mitral regurgitation, paroxysmal atrial fibrillation who is being seen 11/03/2021 for the evaluation of heart failure at the request of Dr. Jomarie LongsJoseph. Echo on 1/4 showed severe aortic stenosis and patient is in the process of being evaluated for a TAVR by the structural heart team.  Assessment & Plan    Severe Aortic Stenosis: Aortic stenosis was identified as moderate on echo in 2015. Echo from 11/02/21 showed severe low flow, low gradient aortic stenosis. - Currently being evaluated by the structural heart team for TAVR  - L/RHC, CT studies have been completed - Outpatient follow up arranged by structural heart team    Acute hypoxic respiratory failure acute on chronic diastolic CHF  - Echocardiogram on 11/02/21 showed grade II diastolic dysfunction, EF 55-60%, severe aortic stenosis, moderate mitral regurgitation  - admission weight 134.7 kg, today's weight 125 kg - will transition to oral diuretic today -we discussed education re: weights, salt, fluid at length today. Put a summary in his discharge instructions -agree with ambulation and monitoring O2 sats -he has known sleep apnea, has declined CPAP.   Atrial Fibrillation  - continue metoprolol 12.5 mg BID and anticoagulation with DOAC once discharged  - CHA2DS2/VAS Stroke Risk Points = 3   [facility] will sign off.   Medication Recommendations:   CONTINUE Apixaban 5 mg BID Furosemide 40 mg daily Metoprolol tartrate 12.5 mg BID  STOP Aspirin  Other recommendations (labs, testing,  etc):  None Follow up as an outpatient:  Patient has outpatient follow up with the structural team.  For questions or updates, please contact [facility] Please consult [email or web address] for contact info under        Signed, Jodelle RedBridgette Zeda Gangwer, MD  11/06/2021, 10:50 AM

## 2021-11-06 NOTE — Progress Notes (Signed)
Mobility Specialist Progress Note:   11/06/21 1331  Mobility  Activity Ambulated in hall  Level of Assistance Modified independent, requires aide device or extra time  Assistive Device None  Distance Ambulated (ft) 120 ft  Mobility Ambulated with assistance in hallway  Mobility Response Tolerated well  Mobility performed by Mobility specialist  Bed Position Chair  $Mobility charge 1 Mobility   SATURATION QUALIFICATIONS: (This note is used to comply with regulatory documentation for home oxygen)  Patient Saturations on [place] at Rest = 95%  Patient Saturations on [place] while Ambulating = 90%  Patient Saturations on n/a Liters of oxygen while Ambulating = n/a%  Pt received in bed willing to participate in mobility. No complaints of pain. Pt left in chair with call bell in reach and all needs met.   [facility] Public librarian Phone [phone] Secondary Phone [phone]

## 2021-11-06 NOTE — Progress Notes (Signed)
PROGRESS NOTE    Louis Koch  [id number] DOB: 1927-02-04 DOA: 11/01/2021 PCP: Pcp, No  Brief Narrative: 89/M with history of paroxysmal atrial fibrillation, not on anticoagulation, moderate aortic stenosis, moderate MR presented to the ED with shortness of breath.  He reported progressive dyspnea on exertion for 1 month, significantly worse in the last 3 days with orthopnea.  Subsequently EMS was called, upon arrival O2 sats were in the 70s with wheezing and respiratory distress, he was placed on a nonrebreather, given nebulizations and brought to the ED. -In the emergency room he was noted to be in distress, chest x-ray noted cardiomegaly and interstitial edema -Upon further work-up he was noted to have severe aortic stenosis   Assessment & Plan:  Acute hypoxic respiratory failure Acute diastolic CHF Severe aortic stenosis -Volume status is improving,  -2D echo noted preserved EF, grade 2 diastolic dysfunction, severe aortic stenosis  -Cardiology consulting, TAVR work-up ongoing, underwent left heart cath 1/6 which showed moderate nonobstructive CAD,  filling pressures are high, Lasix resumed -CTAs of chest, abdomen pelvis etc. completed yesterday -Clinically improving, could continue IV Lasix 1 more day -Attempt to wean off oxygen, check ambulatory pulse ox -Discharge planning, home tomorrow if stable  A. fib RVR -Continue low-dose metoprolol -Off IV heparin, started on Eliquis  Hyponatremia -Secondary to hypovolemic state, improving  DVT prophylaxis: Eliquis Code Status: Full code Family Communication: Discussed with patient and friend at bedside Disposition Plan: Home pending above work-up Status is: Inpatient  Remains inpatient appropriate because: Severity of illness  Consultants:  Cardiology  Procedures:   Echo noted severe aortic stenosis, preserved ejection fraction  Left  heart cath    Mid RCA lesion is 50% stenosed.   Mid LAD lesion is 40% stenosed.    1.  Mild to moderate nonobstructive coronary artery disease with moderate ostial RCA stenosis, mild to moderate mid RCA stenosis, and an otherwise patent right coronary artery. 2.  Patent left main with mild plaquing 3.  Patent LAD with mild mid vessel stenosis of 40% 4.  Patent left circumflex with mild luminal irregularity. 5.  Calcified, restricted aortic valve with peak to peak gradient 24 mmHg 6.  Elevated intracardiac filling pressures with mean wedge pressure of 27, mean PA pressure of 48, and right atrial mean pressure of 16.   Recommend: Continued TAVR evaluation, continued IV diuresis for treatment of heart failure, okay to start apixaban tomorrow morning.    Subjective: -Breathing continues to slowly improve, still on oxygen  Objective: Vitals:   11/05/21 1656 11/05/21 1947 11/06/21 0500 11/06/21 0806  BP: (!) 122/95 119/71  112/62  Pulse:  69  79  Resp:  18  18  Temp:  97.7 F (36.5 C)  98.1 F (36.7 C)  TempSrc:  Oral  Oral  SpO2:  98%  97%  Weight:   125 kg   Height:        Intake/Output Summary (Last 24 hours) at 11/06/2021 1052 Last data filed at 11/06/2021 0500 Gross per 24 hour  Intake 240 ml  Output 1775 ml  Net -1535 ml   Filed Weights   11/04/21 0444 11/05/21 0352 11/06/21 0500  Weight: 131 kg 128.7 kg 125 kg    Examination:  General exam: Pleasant elderly male sitting up in bed, AAOx3, no distress HEENT: No JVD CVS: S1-S2, regular rate and, systolic murmur Lungs: Few basilar rales noted Abdomen: Soft, nontender, bowel sounds present Extremities: No edema Skin: No rash on exposed skin  Psych: Appropriate  mood and affect   Data Reviewed:   CBC: Recent Labs  Lab 11/01/21 1155 11/01/21 1448 11/02/21 0134 11/03/21 0111 11/04/21 0316 11/04/21 1423 11/04/21 1438 11/05/21 0338  WBC 6.0  --  4.9 8.8 7.6  --   --  7.5  HGB 13.5   < > 12.5* 13.0 12.4* 12.9* 12.9* 13.0  HCT 41.0   < > 37.6* 39.2 38.2* 38.0* 38.0* 39.8  MCV 95.6  --  94.0  95.1 96.5  --   --  95.9  PLT 212  --  218 197 202  --   --  198   < > = values in this interval not displayed.   Basic Metabolic Panel: Recent Labs  Lab 11/02/21 0134 11/03/21 0111 11/04/21 0316 11/04/21 1423 11/04/21 1438 11/05/21 0338 11/06/21 0405  NA 132* 132* 134* 137 137 133* 134*  K 4.2 3.9 4.1 4.2 4.2 4.5 4.0  CL 99 100 98  --   --  98 94*  CO2 26 24 26   --   --  26 33*  GLUCOSE 134* 111* 94  --   --  101* 107*  BUN 14 18 18   --   --  15 14  CREATININE 0.76 0.89 0.83  --   --  0.74 0.91  CALCIUM 8.7* 8.5* 8.4*  --   --  8.9 8.9   GFR: Estimated Creatinine Clearance: 75.8 mL/min (by C-G formula based on SCr of 0.91 mg/dL). Liver Function Tests: Recent Labs  Lab 11/01/21 1155  AST 32  ALT 28  ALKPHOS 104  BILITOT 2.4*  PROT 6.8  ALBUMIN 3.7   No results for input(s): LIPASE, AMYLASE in the last 168 hours. No results for input(s): AMMONIA in the last 168 hours. Coagulation Profile: No results for input(s): INR, PROTIME in the last 168 hours. Cardiac Enzymes: No results for input(s): CKTOTAL, CKMB, CKMBINDEX, TROPONINI in the last 168 hours. BNP (last 3 results) No results for input(s): PROBNP in the last 8760 hours. HbA1C: No results for input(s): HGBA1C in the last 72 hours. CBG: No results for input(s): GLUCAP in the last 168 hours. Lipid Profile: No results for input(s): CHOL, HDL, LDLCALC, TRIG, CHOLHDL, LDLDIRECT in the last 72 hours. Thyroid Function Tests: No results for input(s): TSH, T4TOTAL, FREET4, T3FREE, THYROIDAB in the last 72 hours. Anemia Panel: No results for input(s): VITAMINB12, FOLATE, FERRITIN, TIBC, IRON, RETICCTPCT in the last 72 hours. Urine analysis: No results found for: COLORURINE, APPEARANCEUR, LABSPEC, PHURINE, GLUCOSEU, HGBUR, BILIRUBINUR, KETONESUR, PROTEINUR, UROBILINOGEN, NITRITE, LEUKOCYTESUR Sepsis Labs: @LABRCNTIP (procalcitonin:4,lacticidven:4)  ) Recent Results (from the past 240 hour(s))  Resp Panel by RT-PCR  (Flu A&B, Covid) Nasopharyngeal Swab     Status: None   Collection Time: 11/01/21 11:30 AM   Specimen: Nasopharyngeal Swab; Nasopharyngeal(NP) swabs in vial transport medium  Result Value Ref Range Status   SARS Coronavirus 2 by RT PCR NEGATIVE NEGATIVE Final    Comment: (NOTE) SARS-CoV-2 target nucleic acids are NOT DETECTED.  The SARS-CoV-2 RNA is generally detectable in upper respiratory specimens during the acute phase of infection. The lowest concentration of SARS-CoV-2 viral copies this assay can detect is 138 copies/mL. A negative result does not preclude SARS-Cov-2 infection and should not be used as the sole basis for treatment or other patient management decisions. A negative result may occur with  improper specimen collection/handling, submission of specimen other than nasopharyngeal swab, presence of viral mutation(s) within the areas targeted by this assay, and inadequate number of viral copies(<138  copies/mL). A negative result must be combined with clinical observations, patient history, and epidemiological information. The expected result is Negative.  Fact Sheet for Patients:  EntrepreneurPulse.com.au  Fact Sheet for Healthcare Providers:  IncredibleEmployment.be  This test is no t yet approved or cleared by the [place] FDA and  has been authorized for detection and/or diagnosis of SARS-CoV-2 by FDA under an Emergency Use Authorization (EUA). This EUA will remain  in effect (meaning this test can be used) for the duration of the COVID-19 declaration under Section 564(b)(1) of the Act, 21 U.S.C.section 360bbb-3(b)(1), unless the authorization is terminated  or revoked sooner.       Influenza A by PCR NEGATIVE NEGATIVE Final   Influenza B by PCR NEGATIVE NEGATIVE Final    Comment: (NOTE) The Xpert Xpress SARS-CoV-2/FLU/RSV plus assay is intended as an aid in the diagnosis of influenza from Nasopharyngeal swab specimens  and should not be used as a sole basis for treatment. Nasal washings and aspirates are unacceptable for Xpert Xpress SARS-CoV-2/FLU/RSV testing.  Fact Sheet for Patients: EntrepreneurPulse.com.au  Fact Sheet for Healthcare Providers: IncredibleEmployment.be  This test is not yet approved or cleared by the [place] FDA and has been authorized for detection and/or diagnosis of SARS-CoV-2 by FDA under an Emergency Use Authorization (EUA). This EUA will remain in effect (meaning this test can be used) for the duration of the COVID-19 declaration under Section 564(b)(1) of the Act, 21 U.S.C. section 360bbb-3(b)(1), unless the authorization is terminated or revoked.  Performed at [facility], [place] [address] [place], [place] [address]          Radiology Studies: CARDIAC CATHETERIZATION  Result Date: 11/04/2021   Mid RCA lesion is 50% stenosed.   Mid LAD lesion is 40% stenosed. 1.  Mild to moderate nonobstructive coronary artery disease with moderate ostial RCA stenosis, mild to moderate mid RCA stenosis, and an otherwise patent right coronary artery. 2.  Patent left main with mild plaquing 3.  Patent LAD with mild mid vessel stenosis of 40% 4.  Patent left circumflex with mild luminal irregularity. 5.  Calcified, restricted aortic valve with peak to peak gradient 24 mmHg 6.  Elevated intracardiac filling pressures with mean wedge pressure of 27, mean PA pressure of 48, and right atrial mean pressure of 16. Recommend: Continued TAVR evaluation, continued IV diuresis for treatment of heart failure, okay to start apixaban tomorrow morning.     Scheduled Meds:  apixaban  5 mg Oral BID   furosemide  40 mg Oral Daily   guaiFENesin  1,200 mg Oral BID   metoprolol tartrate  12.5 mg Oral BID   polyethylene glycol  17 g Oral Daily   sodium chloride flush  3 mL Intravenous Q12H   Continuous Infusions:  sodium chloride     sodium chloride        LOS: 5 days    Time spent: 58min    Domenic Polite, MD [facility]   11/06/2021, 10:52 AM

## 2021-11-06 NOTE — Plan of Care (Signed)

## 2021-11-06 NOTE — Progress Notes (Signed)
Pt would like to wait until after eating lunch to ambulate. Pt is currently on RA.

## 2021-11-07 ENCOUNTER — Other Ambulatory Visit (HOSPITAL_COMMUNITY): Payer: Self-pay

## 2021-11-07 ENCOUNTER — Encounter (HOSPITAL_COMMUNITY): Payer: Self-pay | Admitting: Cardiovascular Disease

## 2021-11-07 LAB — BASIC METABOLIC PANEL
Anion gap: 8 (ref 5–15)
BUN: 20 mg/dL (ref 8–23)
CO2: 30 mmol/L (ref 22–32)
Calcium: 8.6 mg/dL — ABNORMAL LOW (ref 8.9–10.3)
Chloride: 97 mmol/L — ABNORMAL LOW (ref 98–111)
Creatinine, Ser: 0.96 mg/dL (ref 0.61–1.24)
GFR, Estimated: >60 mL/min (ref >60)
Glucose, Bld: 110 mg/dL — ABNORMAL HIGH (ref 70–99)
Potassium: 3.3 mmol/L — ABNORMAL LOW (ref 3.5–5.1)
Sodium: 135 mmol/L (ref 135–145)

## 2021-11-07 MED ORDER — FUROSEMIDE 40 MG PO TABS
40.0000 mg | ORAL_TABLET | Freq: Every day | ORAL | 0 refills | Status: DC
  Filled 2021-11-07: qty 30, 30d supply, fill #0

## 2021-11-07 MED ORDER — FUROSEMIDE 40 MG PO TABS: 40.0000 mg | ORAL_TABLET | Freq: Every day | ORAL | 0 refills | Status: DC

## 2021-11-07 MED ORDER — METOPROLOL TARTRATE 25 MG PO TABS: 12.5000 mg | ORAL_TABLET | Freq: Two times a day (BID) | ORAL | 0 refills | Status: DC

## 2021-11-07 MED ORDER — APIXABAN 5 MG PO TABS
5.0000 mg | ORAL_TABLET | Freq: Two times a day (BID) | ORAL | 0 refills | Status: DC
  Filled 2021-11-07: qty 60, 30d supply, fill #0

## 2021-11-07 MED ORDER — METOPROLOL TARTRATE 25 MG PO TABS
12.5000 mg | ORAL_TABLET | Freq: Two times a day (BID) | ORAL | 0 refills | Status: DC
  Filled 2021-11-07: qty 60, 60d supply, fill #0

## 2021-11-07 MED ORDER — POTASSIUM CHLORIDE CRYS ER 20 MEQ PO TBCR: 20.0000 meq | EXTENDED_RELEASE_TABLET | Freq: Every day | ORAL | 0 refills | Status: DC

## 2021-11-07 MED ORDER — POTASSIUM CHLORIDE CRYS ER 20 MEQ PO TBCR
40.0000 meq | EXTENDED_RELEASE_TABLET | Freq: Once | ORAL | Status: AC
  Administered 2021-11-07: 40 meq via ORAL
  Filled 2021-11-07: qty 2

## 2021-11-07 MED ORDER — APIXABAN 5 MG PO TABS: 5.0000 mg | ORAL_TABLET | Freq: Two times a day (BID) | ORAL | 0 refills | Status: DC

## 2021-11-07 NOTE — Progress Notes (Signed)
MD notified of K+3.3

## 2021-11-07 NOTE — Care Management Important Message (Signed)
Important Message  Patient Details  Name: Louis Koch MRN: [id number] Date of Birth: 02/11/1927   Medicare Important Message Given:  Yes     Shelda Altes 11/07/2021, 9:03 AM

## 2021-11-07 NOTE — Progress Notes (Signed)
Patient given discharge instructions and stated understanding.  Patients girlfriend will be picking him up to take him home.

## 2021-11-07 NOTE — TOC Progression Note (Addendum)
Transition of Care [facility]) - Progression Note    Patient Details  Name: Louis Koch MRN: [id number] Date of Birth: [birth year]  [facility] CM/SW Contact  [name], RN Phone Number: 11/07/2021, 9:35 AM  Clinical Narrative:    NCM contacted [facility] to see what the price of the eliquis is , the copay price is 609.11,  NCM informed patient and asked if he has medication coverage , he said he was not sure, he gave me the number for his girlfriend , Louis Koch [phone].  NCM left her message.  Girlfriend called this NCM back states he goes to [facility] on [address] and he uses a savings card, and that this is the first time he is having to get medications. NCM just found out patient left ant he had not got medications from [facility] pharmacy , Son is on his way to bring patient, back.  NCM informed Staff RN why did patient leave without his medications and without his eliquis patient ast application.        Expected Discharge Plan and Services           Expected Discharge Date: 11/07/21                                     Social Determinants of Health (SDOH) Interventions    Readmission Risk Interventions No flowsheet data found.

## 2021-11-07 NOTE — Plan of Care (Signed)
  Problem: Education: Goal: Knowledge of General Education information will improve Description: Including pain rating scale, medication(s)/side effects and non-pharmacologic comfort measures Outcome: Progressing   Problem: Health Behavior/Discharge Planning: Goal: Ability to manage health-related needs will improve Outcome: Progressing   Problem: Clinical Measurements: Goal: Ability to maintain clinical measurements within normal limits will improve Outcome: Progressing Goal: Will remain free from infection Outcome: Progressing Goal: Diagnostic test results will improve Outcome: Progressing Goal: Respiratory complications will improve Outcome: Progressing Goal: Cardiovascular complication will be avoided Outcome: Progressing   Problem: Nutrition: Goal: Adequate nutrition will be maintained Outcome: Progressing   Problem: Activity: Goal: Risk for activity intolerance will decrease Outcome: Progressing   

## 2021-11-07 NOTE — Plan of Care (Signed)
°  Problem: Education: Goal: Knowledge of General Education information will improve Description: Including pain rating scale, medication(s)/side effects and non-pharmacologic comfort measures Outcome: Adequate for Discharge   Problem: Health Behavior/Discharge Planning: Goal: Ability to manage health-related needs will improve Outcome: Adequate for Discharge   Problem: Clinical Measurements: Goal: Ability to maintain clinical measurements within normal limits will improve Outcome: Adequate for Discharge Goal: Will remain free from infection Outcome: Adequate for Discharge Goal: Diagnostic test results will improve Outcome: Adequate for Discharge Goal: Respiratory complications will improve Outcome: Adequate for Discharge Goal: Cardiovascular complication will be avoided Outcome: Adequate for Discharge   Problem: Activity: Goal: Risk for activity intolerance will decrease Outcome: Adequate for Discharge   Problem: Nutrition: Goal: Adequate nutrition will be maintained Outcome: Adequate for Discharge   Problem: Coping: Goal: Level of anxiety will decrease Outcome: Adequate for Discharge   Problem: Elimination: Goal: Will not experience complications related to bowel motility Outcome: Adequate for Discharge Goal: Will not experience complications related to urinary retention Outcome: Adequate for Discharge   Problem: Pain Managment: Goal: General experience of comfort will improve Outcome: Adequate for Discharge   Problem: Safety: Goal: Ability to remain free from injury will improve Outcome: Adequate for Discharge   Problem: Skin Integrity: Goal: Risk for impaired skin integrity will decrease Outcome: Adequate for Discharge   Problem: Acute Rehab PT Goals(only PT should resolve) Goal: Patient Will Transfer Sit To/From Stand Outcome: Adequate for Discharge Goal: Pt Will Transfer Bed To Chair/Chair To Bed Outcome: Adequate for Discharge Goal: Pt Will  Ambulate Outcome: Adequate for Discharge Goal: Pt/caregiver will Perform Home Exercise Program Outcome: Adequate for Discharge   

## 2021-11-08 NOTE — Discharge Summary (Signed)
Physician Discharge Summary  Louis Koch [id number] DOB: [birth year] DOA: 11/01/2021  PCP: Pcp, No  Admit date: 11/01/2021 Discharge date: 11/07/2021  Time spent: 35 minutes  Recommendations for Outpatient Follow-up:  Follow-up with Dr. Angelena Form for TAVR next week   Discharge Diagnoses:  Principal Problem:   Severe aortic stenosis Active Problems:   Acute CHF (congestive heart failure) (HCC)   Acute respiratory failure with hypoxia (HCC)   Atrial fibrillation with RVR (HCC)   Hyponatremia   Acute diastolic CHF (congestive heart failure) ([place])   Discharge Condition: Improved  Diet recommendation: Low-sodium  Filed Weights   11/05/21 0352 11/06/21 0500 11/07/21 0448  Weight: 128.7 kg 125 kg 124.1 kg    History of present illness:  88/M with history of paroxysmal atrial fibrillation, not on anticoagulation, moderate aortic stenosis, moderate MR presented to the ED with shortness of breath.  He reported progressive dyspnea on exertion for 1 month, significantly worse in the last 3 days with orthopnea.  Subsequently EMS was called, upon arrival O2 sats were in the 70s with wheezing and respiratory distress, he was placed on a nonrebreather, given nebulizations and brought to the ED. -In the emergency room he was noted to be in distress, chest x-ray noted cardiomegaly and interstitial edema  Hospital Course:   Acute hypoxic respiratory failure Acute diastolic CHF Severe aortic stenosis -Was volume overloaded on admission, improved with diuresis -2D echo noted preserved EF, grade 2 diastolic dysfunction, severe aortic stenosis  -Cardiology consulting, TAVR work-up ongoing, underwent left heart cath 1/6 which showed moderate nonobstructive CAD,  filling pressures were high high, Lasix resumed -CTAs of chest, abdomen pelvis etc. completed -Transition to oral Lasix now, weaned off oxygen -Discharged home in stable condition, follow-up with cardiology next week for TAVR   A. fib  RVR -Continue low-dose metoprolol, heart rate controlled -Off IV heparin, started on Eliquis   Hyponatremia -Secondary to hypervolemic state, improved  Procedures:  Echo noted severe aortic stenosis, preserved ejection fraction   Left  heart cath    Mid RCA lesion is 50% stenosed.   Mid LAD lesion is 40% stenosed.   1.  Mild to moderate nonobstructive coronary artery disease with moderate ostial RCA stenosis, mild to moderate mid RCA stenosis, and an otherwise patent right coronary artery. 2.  Patent left main with mild plaquing 3.  Patent LAD with mild mid vessel stenosis of 40% 4.  Patent left circumflex with mild luminal irregularity. 5.  Calcified, restricted aortic valve with peak to peak gradient 24 mmHg 6.  Elevated intracardiac filling pressures with mean wedge pressure of 27, mean PA pressure of 48, and right atrial mean pressure of 16.   Recommend: Continued TAVR evaluation, continued IV diuresis for treatment of heart failure, okay to start apixaban tomorrow morning.   Consultations: Cardiology  Discharge Exam: Vitals:   11/07/21 0441 11/07/21 0802  BP: 131/89 110/67  Pulse: (!) 101 90  Resp: 18 18  Temp: 97.8 F (36.6 C) 97.6 F (36.4 C)  SpO2: 95% 96%   General exam: Pleasant elderly male sitting up in bed, AAOx3, no distress HEENT: No JVD CVS: S1-S2, regular rate and, systolic murmur Lungs: Few basilar rales noted Abdomen: Soft, nontender, bowel sounds present Extremities: No edema Skin: No rash on exposed skin  Psych: Appropriate mood and affect      Discharge Instructions   Discharge Instructions     Diet - low sodium heart healthy   Complete by: As directed    Increase activity  slowly   Complete by: As directed       Allergies as of 11/07/2021   No Known Allergies      Medication List     STOP taking these medications    aspirin 500 MG EC tablet       TAKE these medications    B COMPLEX PO Take 1 tablet by mouth daily.    B-12 PO Take 1 tablet by mouth daily.   bisacodyl 5 MG EC tablet Commonly known as: DULCOLAX Take 10 mg by mouth daily.   D3 PO Take 1 capsule by mouth daily.   Eliquis 5 MG Tabs tablet Generic drug: apixaban Take 1 tablet (5 mg total) by mouth 2 (two) times daily.   furosemide 40 MG tablet Commonly known as: LASIX Take 1 tablet (40 mg total) by mouth daily.   metoprolol tartrate 25 MG tablet Commonly known as: LOPRESSOR Take 1/2 tablet (12.5 mg total) by mouth 2 (two) times daily.   multivitamin with minerals Tabs tablet Take 1 tablet by mouth daily.   potassium chloride SA 20 MEQ tablet Commonly known as: KLOR-CON M Take 1 tablet (20 mEq total) by mouth daily.       No Known Allergies  Follow-up Information     Gaye Pollack, MD. Go on 11/09/2021.   Specialty: Cardiothoracic Surgery Why: @ 11am. Please make this apt and you will get surgery instructions for the following week. Contact information: [address] [place] [address] [phone]                  The results of significant diagnostics from this hospitalization (including imaging, microbiology, ancillary and laboratory) are listed below for reference.    Significant Diagnostic Studies: CARDIAC CATHETERIZATION  Result Date: 11/04/2021   Mid RCA lesion is 50% stenosed.   Mid LAD lesion is 40% stenosed. 1.  Mild to moderate nonobstructive coronary artery disease with moderate ostial RCA stenosis, mild to moderate mid RCA stenosis, and an otherwise patent right coronary artery. 2.  Patent left main with mild plaquing 3.  Patent LAD with mild mid vessel stenosis of 40% 4.  Patent left circumflex with mild luminal irregularity. 5.  Calcified, restricted aortic valve with peak to peak gradient 24 mmHg 6.  Elevated intracardiac filling pressures with mean wedge pressure of 27, mean PA pressure of 48, and right atrial mean pressure of 16. Recommend: Continued TAVR evaluation,  continued IV diuresis for treatment of heart failure, okay to start apixaban tomorrow morning.   CT CORONARY MORPH W/CTA COR W/SCORE W/CA W/CM &/OR WO/CM  Addendum Date: 11/07/2021   ADDENDUM REPORT: 11/07/2021 08:32 ADDENDUM: Extracardiac findings are described separately under dictation for contemporaneously obtained CTA chest, abdomen and pelvis dated 11/05/2021. Please see that dictation for full description of relevant extracardiac findings. Electronically Signed   By: Vinnie Langton M.D.   On: 11/07/2021 08:32   Result Date: 11/07/2021 CLINICAL DATA:  Severe Aortic Stenosis. EXAM: Cardiac TAVR CT TECHNIQUE: A non-contrast, gated CT scan was obtained with axial slices of 3 mm through the heart for aortic valve calcium scoring. A 120 kV retrospective, gated, contrast cardiac scan was obtained. Gantry rotation speed was 250 msecs and collimation was 0.6 mm. Nitroglycerin was not given. The 3D data set was reconstructed in 5% intervals of the 0-95% of the R-R cycle. Systolic and diastolic phases were analyzed on a dedicated workstation using MPR, MIP, and VRT modes. The patient received 100 cc of  contrast. FINDINGS: Image quality: Excellent. Noise artifact is: Limited. Valve Morphology: The aortic valve sis tricuspid. The leaflets are severely calcified with bulky calcification of the [place] that extends to the annulus. There is acquired fusion of the NCC/LCC. Aortic Valve Calcium score: 2247 Aortic annular dimension: Phase assessed: 35% Annular area: 511 mm2 Annular perimeter: 81.2 mm Max diameter: 27.8 mm Min diameter: 23.5 mm Annular and subannular calcification: A single, moderate, protruding calcification is noted under the [place]. Membranous septum length: 13.1 mm. Poor quality study for this measurement. Optimal coplanar projection: LAO 5 CAU 0 Coronary Artery Height above Annulus: Left Main: 15.9 mm Right Coronary: 21.7 mm Sinus of Valsalva Measurements: Non-coronary: 37.5 mm Right-coronary: 34.4 mm  Left-coronary: 35.0 mm Sinus of Valsalva Height: Non-coronary: 25.7 mm Right-coronary: 26.5 mm Left-coronary: 23.4 mm Sinotubular Junction: 35 mm Ascending Thoracic Aorta: 42 mm Coronary Arteries: Normal coronary origin. Right dominance. The study was performed without use of NTG and is insufficient for plaque evaluation. Please refer to recent cardiac catheterization for coronary assessment. 3-vessel coronary calcifications. Cardiac Morphology: Right Atrium: Right atrial size is within normal limits. Right Ventricle: The right ventricular cavity is dilated. Left Atrium: Left atrial size is normal in size. There is mixing artifact in the distal LAA and cannot exclude thrombus. Left Ventricle: The ventricular cavity size is within normal limits. There are no stigmata of prior infarction. There is no abnormal filling defect. Mildly reduced left ventricular function, LVEF=40%. Global hypokinesis. Pulmonary arteries: Normal in size without proximal filling defect. Pulmonary veins: Normal pulmonary venous drainage. Pericardium: Normal thickness with no significant effusion or calcium present. Mitral Valve: The mitral valve is normal structure without significant calcification. Extra-cardiac findings: Small right pleural effusion. See attached radiology report for non-cardiac structures. IMPRESSION: 1. Severely calcified tricuspid aortic valve with bulky calcification of the [place]. 2. Annular measurements appropriate for 26 mm S3 (511 m2). 3. A single, moderate, protruding calcification is noted under the [place]. 4. Sufficient coronary to annulus distance. 5. Optimal Fluoroscopic Angle for Delivery: LAO 5 CAU 0 6. Mildly reduced left ventricular function, LVEF=40%. Global hypokinesis. 7. Ascending aortic aneurysm up to 42 mm. 8. There is mixing artifact in the distal LAA and cannot exclude thrombus. [place] T. Audie Box, MD Electronically Signed: By: Eleonore Chiquito M.D. On: 11/06/2021 16:58   DG Chest Port 1 View  Result Date:  11/01/2021 CLINICAL DATA:  Shortness of breath EXAM: PORTABLE CHEST 1 VIEW COMPARISON:  None. FINDINGS: Transverse diameter of heart is increased. Central pulmonary vessels are prominent. Interstitial markings in the parahilar regions and lower lung fields are prominent. There is no focal pulmonary consolidation. Left lateral CP angle is not included in its entirety. There is no pneumothorax. Degenerative changes are noted in the right shoulder. IMPRESSION: Cardiomegaly. There is prominence of interstitial markings in the parahilar regions and lower lung fields suggesting mild interstitial edema or interstitial pneumonia. There is no focal pulmonary consolidation. There is no pleural effusion. Electronically Signed   By: Elmer Picker M.D.   On: 11/01/2021 12:04   CT ANGIO CHEST AORTA W/CM & OR WO/CM  Result Date: 11/07/2021 CLINICAL DATA:  88 year old male with history of severe aortic stenosis. Preprocedural study prior to potential transcatheter aortic valve replacement (TAVR) procedure. EXAM: CT ANGIOGRAPHY CHEST, ABDOMEN AND PELVIS TECHNIQUE: Multidetector CT imaging through the chest, abdomen and pelvis was performed using the standard protocol during bolus administration of intravenous contrast. Multiplanar reconstructed images and MIPs were obtained and reviewed to evaluate the vascular anatomy. CONTRAST:  174mL OMNIPAQUE IOHEXOL 350 MG/ML SOLN COMPARISON:  No priors. FINDINGS: CTA CHEST FINDINGS Cardiovascular: Heart size is mildly enlarged with left atrial dilatation, but normal left ventricular size. There is no significant pericardial fluid, thickening or pericardial calcification. There is aortic atherosclerosis, as well as atherosclerosis of the great vessels of the mediastinum and the coronary arteries, including calcified atherosclerotic plaque in the left main, left anterior descending, left circumflex and right coronary arteries. Severe thickening and calcification of the aortic valve. Mild  calcifications of the mitral annulus. Mediastinum/Lymph Nodes: No pathologically enlarged mediastinal or hilar lymph nodes. Esophagus is unremarkable in appearance. No axillary lymphadenopathy. Lungs/Pleura: Small right and trace left pleural effusions lying dependently. Patchy areas of peribronchovascular predominant ground-glass attenuation are noted in the central aspects of the lungs bilaterally, most severe throughout the mid to upper lungs. No definite suspicious appearing pulmonary nodules or masses are noted. Musculoskeletal/Soft Tissues: There are no aggressive appearing lytic or blastic lesions noted in the visualized portions of the skeleton. CTA ABDOMEN AND PELVIS FINDINGS Hepatobiliary: No suspicious cystic or solid hepatic lesions. No intra or extrahepatic biliary ductal dilatation. Gallbladder is normal in appearance. Pancreas: No pancreatic mass. No pancreatic ductal dilatation. No pancreatic or peripancreatic fluid collections or inflammatory changes. Spleen: Unremarkable. Adrenals/Urinary Tract: Generalized cortical atrophy in the kidneys bilaterally. No suspicious renal lesions. No hydroureteronephrosis. Bilateral adrenal glands are normal in appearance. 1.9 cm diverticulum in the right-side of the urinary bladder incidentally noted. Urinary bladder is otherwise unremarkable in appearance. Stomach/Bowel: The appearance of the stomach is normal. There is no pathologic dilatation of small bowel or colon. The appendix is not confidently identified and may be surgically absent. Regardless, there are no inflammatory changes noted adjacent to the cecum to suggest the presence of an acute appendicitis at this time. Vascular/Lymphatic: Vascular findings and measurements pertinent to potential TAVR procedure, as detailed below. No aneurysm or dissection noted in the abdominal or pelvic vasculature. Intermediate attenuation fluid surrounding the right superficial femoral and common femoral arteries, likely  residual blood products related to recent vascular access. No lymphadenopathy noted in the abdomen or pelvis. Reproductive: Prostate gland and seminal vesicles are unremarkable in appearance. Other: No significant volume of ascites.  No pneumoperitoneum. Musculoskeletal: There are no aggressive appearing lytic or blastic lesions noted in the visualized portions of the skeleton. VASCULAR MEASUREMENTS PERTINENT TO TAVR: AORTA: Minimal Aortic Diameter-19 x 17 mm Severity of Aortic Calcification-mild RIGHT PELVIS: Right Common Iliac Artery - Minimal Diameter-12.0 x 12.3 mm Tortuosity-mild Calcification-minimal Right External Iliac Artery - Minimal Diameter-10.3 x 9.5 mm Tortuosity-severe Calcification-none Right Common Femoral Artery - Minimal Diameter-10.4 x 10.7 mm Tortuosity-mild Calcification-minimal LEFT PELVIS: Left Common Iliac Artery - Minimal Diameter-14.1 x 12.7 mm Tortuosity-moderate Calcification-mild Left External Iliac Artery - Minimal Diameter-9.7 x 9.7 mm Tortuosity-severe Calcification-none Left Common Femoral Artery - Minimal Diameter-10.6 x 10.1 mm Tortuosity-mild Calcification-minimal Review of the MIP images confirms the above findings. IMPRESSION: 1. Vascular findings and measurements pertinent to potential TAVR procedure, as detailed above. 2. Severe thickening calcification of the aortic valve, compatible with reported clinical history of severe aortic stenosis. 3. The appearance of the lungs could reflect pulmonary edema. Given the presence of small bilateral pleural effusions and normal left ventricular size, diastolic congestive heart failure should be considered. Alternatively, if there is history of fever and sputum production, multilobar bilateral bronchopneumonia should be considered. 4. Aortic atherosclerosis, in addition to left main and three-vessel coronary artery disease. 5. Additional incidental findings, as above. Electronically Signed  By: Vinnie Langton M.D.   On: 11/07/2021  08:47   ECHOCARDIOGRAM COMPLETE  Result Date: 11/02/2021    ECHOCARDIOGRAM REPORT   Patient Name:   Louis Koch Date of Exam: 11/02/2021 Medical Rec #:  [id number]    Height:       77.0 in Accession #:    [id number]   Weight:       296.7 lb Date of Birth:  May 12, 1927    BSA:          2.645 m Patient Age:    89 years     BP:           134/90 mmHg Patient Gender: M            HR:           74 bpm. Exam Location:  Inpatient Procedure: 2D Echo, Cardiac Doppler and Color Doppler Indications:    CHF-Acute Diastolic [id number]  History:        Patient has no prior history of Echocardiogram examinations.  Sonographer:    Bernadene Person RDCS Referring Phys: [id number] [place]  1. The aortic valve is incompletely visualized on SAX view, however, suspect severe (vs moderate-to-severe) paradoxical low flow, low gradient aortic stenosis with AVA 0.8cm2 by continuity, mean gradient 22mmHg, Vmax 3.25m/s, DI 0.23, SVi 22. Notably the  LVOT VTI may be underestimated due to suboptimal doppler angle.  2. Left ventricular ejection fraction, by estimation, is 55 to 60%. The left ventricle has normal function. The left ventricle has no regional wall motion abnormalities. There is mild asymmetric left ventricular hypertrophy of the basal-septal segment. Left ventricular diastolic parameters are consistent with Grade II diastolic dysfunction (pseudonormalization).  3. Right ventricular systolic function is mildly reduced. The right ventricular size is normal. There is mildly elevated pulmonary artery systolic pressure.  4. Left atrial size was mildly dilated.  5. The mitral valve is degenerative. Mild mitral valve regurgitation. Moderate mitral annular calcification.  6. The aortic valve is tricuspid. There is moderate calcification of the aortic valve. There is moderate thickening of the aortic valve. Aortic valve regurgitation is mild. Severe aortic valve stenosis.  7. Aortic dilatation noted. There is moderate dilatation of  the ascending aorta, measuring 44 mm.  8. The inferior vena cava is dilated in size with <50% respiratory variability, suggesting right atrial pressure of 15 mmHg. Comparison(s): No prior Echocardiogram. FINDINGS  Left Ventricle: Left ventricular ejection fraction, by estimation, is 55 to 60%. The left ventricle has normal function. The left ventricle has no regional wall motion abnormalities. The left ventricular internal cavity size was normal in size. There is  mild asymmetric left ventricular hypertrophy of the basal-septal segment. Left ventricular diastolic parameters are consistent with Grade II diastolic dysfunction (pseudonormalization). Right Ventricle: The right ventricular size is normal. Right vetricular wall thickness was not well visualized. Right ventricular systolic function is mildly reduced. There is mildly elevated pulmonary artery systolic pressure. The tricuspid regurgitant velocity is 2.69 m/s, and with an assumed right atrial pressure of 15 mmHg, the estimated right ventricular systolic pressure is [id number] mmHg. Left Atrium: Left atrial size was mildly dilated. Right Atrium: Right atrial size was normal in size. Pericardium: There is no evidence of pericardial effusion. Mitral Valve: The mitral valve is degenerative in appearance. There is mild thickening of the mitral valve leaflet(s). There is mild calcification of the mitral valve leaflet(s). Moderate mitral annular calcification. Mild mitral valve regurgitation. Tricuspid Valve: The tricuspid valve is normal in  structure. Tricuspid valve regurgitation is mild. Aortic Valve: The aortic valve is tricuspid. There is moderate calcification of the aortic valve. There is moderate thickening of the aortic valve. Aortic valve regurgitation is mild. Aortic regurgitation PHT measures 500 msec. Severe aortic stenosis is present. Aortic valve mean gradient measures 22.0 mmHg. Aortic valve peak gradient measures 32.8 mmHg. Aortic valve area, by VTI  measures 0.83 cm. Pulmonic Valve: The pulmonic valve was not well visualized. Pulmonic valve regurgitation is trivial. Aorta: The aortic root is normal in size and structure and aortic dilatation noted. There is moderate dilatation of the ascending aorta, measuring 44 mm. Venous: The inferior vena cava is dilated in size with less than 50% respiratory variability, suggesting right atrial pressure of 15 mmHg. IAS/Shunts: The atrial septum is grossly normal.  LEFT VENTRICLE PLAX 2D LVIDd:         4.70 cm      Diastology LVIDs:         3.10 cm      LV e' medial:    4.60 cm/s LV PW:         1.00 cm      LV E/e' medial:  33.0 LV IVS:        0.90 cm      LV e' lateral:   6.05 cm/s LVOT diam:     2.10 cm      LV E/e' lateral: 25.1 LV SV:         58 LV SV Index:   22 LVOT Area:     3.46 cm  LV Volumes (MOD) LV vol d, MOD A2C: 68.4 ml LV vol d, MOD A4C: 127.0 ml LV vol s, MOD A2C: 29.4 ml LV vol s, MOD A4C: 55.2 ml LV SV MOD A2C:     39.0 ml LV SV MOD A4C:     127.0 ml LV SV MOD BP:      53.0 ml RIGHT VENTRICLE RV S prime:     7.76 cm/s TAPSE (M-mode): 1.7 cm LEFT ATRIUM             Index        RIGHT ATRIUM           Index LA diam:        4.40 cm 1.66 cm/m   RA Area:     17.70 cm LA Vol (A2C):   87.4 ml 33.04 ml/m  RA Volume:   45.00 ml  17.01 ml/m LA Vol (A4C):   95.4 ml 36.07 ml/m LA Biplane Vol: 93.0 ml 35.16 ml/m  AORTIC VALVE AV Area (Vmax):    0.93 cm AV Area (Vmean):   0.83 cm AV Area (VTI):     0.83 cm AV Vmax:           286.33 cm/s AV Vmean:          222.667 cm/s AV VTI:            0.702 m AV Peak Grad:      32.8 mmHg AV Mean Grad:      22.0 mmHg LVOT Vmax:         77.17 cm/s LVOT Vmean:        53.333 cm/s LVOT VTI:          0.168 m LVOT/AV VTI ratio: 0.24 AI PHT:            500 msec  AORTA Ao Root diam: 3.50 cm Ao Asc diam:  4.40 cm MITRAL VALVE  TRICUSPID VALVE MV Area (PHT): 3.77 cm     TR Peak grad:   28.9 mmHg MV Decel Time: 201 msec     TR Vmax:        269.00 cm/s MR Peak grad: 112.8  mmHg MR Mean grad: 73.0 mmHg     SHUNTS MR Vmax:      531.00 cm/s   Systemic VTI:  0.17 m MR Vmean:     411.0 cm/s    Systemic Diam: 2.10 cm MV E velocity: 152.00 cm/s MV A velocity: 85.00 cm/s MV E/A ratio:  1.79 Gwyndolyn Kaufman MD Electronically signed by Gwyndolyn Kaufman MD Signature Date/Time: 11/02/2021/4:50:05 PM    Final    CT Angio Abd/Pel w/ and/or w/o  Result Date: 11/07/2021 CLINICAL DATA:  88 year old male with history of severe aortic stenosis. Preprocedural study prior to potential transcatheter aortic valve replacement (TAVR) procedure. EXAM: CT ANGIOGRAPHY CHEST, ABDOMEN AND PELVIS TECHNIQUE: Multidetector CT imaging through the chest, abdomen and pelvis was performed using the standard protocol during bolus administration of intravenous contrast. Multiplanar reconstructed images and MIPs were obtained and reviewed to evaluate the vascular anatomy. CONTRAST:  1107mL OMNIPAQUE IOHEXOL 350 MG/ML SOLN COMPARISON:  No priors. FINDINGS: CTA CHEST FINDINGS Cardiovascular: Heart size is mildly enlarged with left atrial dilatation, but normal left ventricular size. There is no significant pericardial fluid, thickening or pericardial calcification. There is aortic atherosclerosis, as well as atherosclerosis of the great vessels of the mediastinum and the coronary arteries, including calcified atherosclerotic plaque in the left main, left anterior descending, left circumflex and right coronary arteries. Severe thickening and calcification of the aortic valve. Mild calcifications of the mitral annulus. Mediastinum/Lymph Nodes: No pathologically enlarged mediastinal or hilar lymph nodes. Esophagus is unremarkable in appearance. No axillary lymphadenopathy. Lungs/Pleura: Small right and trace left pleural effusions lying dependently. Patchy areas of peribronchovascular predominant ground-glass attenuation are noted in the central aspects of the lungs bilaterally, most severe throughout the mid to upper lungs.  No definite suspicious appearing pulmonary nodules or masses are noted. Musculoskeletal/Soft Tissues: There are no aggressive appearing lytic or blastic lesions noted in the visualized portions of the skeleton. CTA ABDOMEN AND PELVIS FINDINGS Hepatobiliary: No suspicious cystic or solid hepatic lesions. No intra or extrahepatic biliary ductal dilatation. Gallbladder is normal in appearance. Pancreas: No pancreatic mass. No pancreatic ductal dilatation. No pancreatic or peripancreatic fluid collections or inflammatory changes. Spleen: Unremarkable. Adrenals/Urinary Tract: Generalized cortical atrophy in the kidneys bilaterally. No suspicious renal lesions. No hydroureteronephrosis. Bilateral adrenal glands are normal in appearance. 1.9 cm diverticulum in the right-side of the urinary bladder incidentally noted. Urinary bladder is otherwise unremarkable in appearance. Stomach/Bowel: The appearance of the stomach is normal. There is no pathologic dilatation of small bowel or colon. The appendix is not confidently identified and may be surgically absent. Regardless, there are no inflammatory changes noted adjacent to the cecum to suggest the presence of an acute appendicitis at this time. Vascular/Lymphatic: Vascular findings and measurements pertinent to potential TAVR procedure, as detailed below. No aneurysm or dissection noted in the abdominal or pelvic vasculature. Intermediate attenuation fluid surrounding the right superficial femoral and common femoral arteries, likely residual blood products related to recent vascular access. No lymphadenopathy noted in the abdomen or pelvis. Reproductive: Prostate gland and seminal vesicles are unremarkable in appearance. Other: No significant volume of ascites.  No pneumoperitoneum. Musculoskeletal: There are no aggressive appearing lytic or blastic lesions noted in the visualized portions of the skeleton. VASCULAR MEASUREMENTS PERTINENT TO TAVR:  AORTA: Minimal Aortic  Diameter-19 x 17 mm Severity of Aortic Calcification-mild RIGHT PELVIS: Right Common Iliac Artery - Minimal Diameter-12.0 x 12.3 mm Tortuosity-mild Calcification-minimal Right External Iliac Artery - Minimal Diameter-10.3 x 9.5 mm Tortuosity-severe Calcification-none Right Common Femoral Artery - Minimal Diameter-10.4 x 10.7 mm Tortuosity-mild Calcification-minimal LEFT PELVIS: Left Common Iliac Artery - Minimal Diameter-14.1 x 12.7 mm Tortuosity-moderate Calcification-mild Left External Iliac Artery - Minimal Diameter-9.7 x 9.7 mm Tortuosity-severe Calcification-none Left Common Femoral Artery - Minimal Diameter-10.6 x 10.1 mm Tortuosity-mild Calcification-minimal Review of the MIP images confirms the above findings. IMPRESSION: 1. Vascular findings and measurements pertinent to potential TAVR procedure, as detailed above. 2. Severe thickening calcification of the aortic valve, compatible with reported clinical history of severe aortic stenosis. 3. The appearance of the lungs could reflect pulmonary edema. Given the presence of small bilateral pleural effusions and normal left ventricular size, diastolic congestive heart failure should be considered. Alternatively, if there is history of fever and sputum production, multilobar bilateral bronchopneumonia should be considered. 4. Aortic atherosclerosis, in addition to left main and three-vessel coronary artery disease. 5. Additional incidental findings, as above. Electronically Signed   By: Vinnie Langton M.D.   On: 11/07/2021 08:47    Microbiology: Recent Results (from the past 240 hour(s))  Resp Panel by RT-PCR (Flu A&B, Covid) Nasopharyngeal Swab     Status: None   Collection Time: 11/01/21 11:30 AM   Specimen: Nasopharyngeal Swab; Nasopharyngeal(NP) swabs in vial transport medium  Result Value Ref Range Status   SARS Coronavirus 2 by RT PCR NEGATIVE NEGATIVE Final    Comment: (NOTE) SARS-CoV-2 target nucleic acids are NOT DETECTED.  The SARS-CoV-2  RNA is generally detectable in upper respiratory specimens during the acute phase of infection. The lowest concentration of SARS-CoV-2 viral copies this assay can detect is 138 copies/mL. A negative result does not preclude SARS-Cov-2 infection and should not be used as the sole basis for treatment or other patient management decisions. A negative result may occur with  improper specimen collection/handling, submission of specimen other than nasopharyngeal swab, presence of viral mutation(s) within the areas targeted by this assay, and inadequate number of viral copies(<138 copies/mL). A negative result must be combined with clinical observations, patient history, and epidemiological information. The expected result is Negative.  Fact Sheet for Patients:  EntrepreneurPulse.com.au  Fact Sheet for Healthcare Providers:  IncredibleEmployment.be  This test is no t yet approved or cleared by the [place] FDA and  has been authorized for detection and/or diagnosis of SARS-CoV-2 by FDA under an Emergency Use Authorization (EUA). This EUA will remain  in effect (meaning this test can be used) for the duration of the COVID-19 declaration under Section 564(b)(1) of the Act, 21 U.S.C.section 360bbb-3(b)(1), unless the authorization is terminated  or revoked sooner.       Influenza A by PCR NEGATIVE NEGATIVE Final   Influenza B by PCR NEGATIVE NEGATIVE Final    Comment: (NOTE) The Xpert Xpress SARS-CoV-2/FLU/RSV plus assay is intended as an aid in the diagnosis of influenza from Nasopharyngeal swab specimens and should not be used as a sole basis for treatment. Nasal washings and aspirates are unacceptable for Xpert Xpress SARS-CoV-2/FLU/RSV testing.  Fact Sheet for Patients: EntrepreneurPulse.com.au  Fact Sheet for Healthcare Providers: IncredibleEmployment.be  This test is not yet approved or cleared by the  [place] FDA and has been authorized for detection and/or diagnosis of SARS-CoV-2 by FDA under an Emergency Use Authorization (EUA). This EUA will remain in effect (meaning  this test can be used) for the duration of the COVID-19 declaration under Section 564(b)(1) of the Act, 21 U.S.C. section 360bbb-3(b)(1), unless the authorization is terminated or revoked.  Performed at [facility], [place] [address] [place], [place] [address]      Labs: Basic Metabolic Panel: Recent Labs  Lab 11/03/21 0111 11/04/21 0316 11/04/21 1423 11/04/21 1438 11/05/21 0338 11/06/21 0405 11/07/21 0404  NA 132* 134* 137 137 133* 134* 135  K 3.9 4.1 4.2 4.2 4.5 4.0 3.3*  CL 100 98  --   --  98 94* 97*  CO2 24 26  --   --  26 33* 30  GLUCOSE 111* 94  --   --  101* 107* 110*  BUN 18 18  --   --  15 14 20   CREATININE 0.89 0.83  --   --  0.74 0.91 0.96  CALCIUM 8.5* 8.4*  --   --  8.9 8.9 8.6*   Liver Function Tests: No results for input(s): AST, ALT, ALKPHOS, BILITOT, PROT, ALBUMIN in the last 168 hours. No results for input(s): LIPASE, AMYLASE in the last 168 hours. No results for input(s): AMMONIA in the last 168 hours. CBC: Recent Labs  Lab 11/02/21 0134 11/03/21 0111 11/04/21 0316 11/04/21 1423 11/04/21 1438 11/05/21 0338  WBC 4.9 8.8 7.6  --   --  7.5  HGB 12.5* 13.0 12.4* 12.9* 12.9* 13.0  HCT 37.6* 39.2 38.2* 38.0* 38.0* 39.8  MCV 94.0 95.1 96.5  --   --  95.9  PLT 218 197 202  --   --  198   Cardiac Enzymes: No results for input(s): CKTOTAL, CKMB, CKMBINDEX, TROPONINI in the last 168 hours. BNP: BNP (last 3 results) Recent Labs    11/01/21 1157  BNP 264.0*    ProBNP (last 3 results) No results for input(s): PROBNP in the last 8760 hours.  CBG: No results for input(s): GLUCAP in the last 168 hours.     Signed:  Domenic Polite MD.  [facility] 11/08/2021, 2:19 PM

## 2021-11-09 ENCOUNTER — Encounter: Payer: Self-pay | Admitting: Surgery

## 2021-11-09 ENCOUNTER — Other Ambulatory Visit: Payer: Self-pay

## 2021-11-09 ENCOUNTER — Institutional Professional Consult (permissible substitution) (INDEPENDENT_AMBULATORY_CARE_PROVIDER_SITE_OTHER): Payer: Medicare Other | Admitting: Surgery

## 2021-11-09 VITALS — BP 118/79 | HR 76 | Resp 20 | Ht 77.0 in | Wt 273.0 lb

## 2021-11-09 DIAGNOSIS — I350 Nonrheumatic aortic (valve) stenosis: Secondary | ICD-10-CM

## 2021-11-09 NOTE — Progress Notes (Addendum)
Patient ID: Louis Koch, male   DOB: [birth year], 88 y.o.   MRN: [id number]  [facility]VE CLINIC         [address]       [place] [address]             [phone]          CARDIOTHORACIC SURGERY CONSULTATION REPORT  PCP is Pcp, No Referring Provider is Sherren Mocha, MD Primary Cardiologist is Buford Dresser, MD  Reason for consultation:  Severe aortic stenosis  HPI:  The patient is an active 88 year old gentleman with a history of paroxysmal atrial fibrillation on Eliquis, and chronic diastolic congestive heart failure who has a long history of heart murmur.  He had an echocardiogram and 2015 at [facility] showing moderate aortic sclerosis and moderate mitral regurgitation with ejection fraction of 65 to 70%.  He has remained active exercising every day doing push-ups, walking, and free weights.  He was in his usual state of health until about 1 month ago when he pulled a groin muscle causing him to slow down his activity.  Since then he has noted decrease exertional tolerance with shortness of breath on exertion as well as orthopnea.  He has had occasional episodes where he had a lot of difficulty breathing and actually had a panic attack.  He presented to the emergency room by EMS on 11/01/2021 for evaluation of shortness of breath.  He was found to be in acute hypoxic respiratory failure with oxygen saturations in the 60s and had atrial fibrillation with RVR.  He was reportedly lethargic and confused.  BNP was 264.  Troponin was 23 and 25.  Chest x-ray showed cardiomegaly with mild interstitial edema.  2D echo on 11/02/2021 showed a trileaflet aortic valve with moderate calcification and thickening.  The mean gradient was 22 mmHg with a peak gradient of 33 mmHg.  Aortic valve area was measured by VTI at 0.83 cm.  Left ventricular ejection fraction was 55 to 60% with a low stroke-volume index of 22.  Dimensionless index was  0.24.  There is mild aortic insufficiency and mild mitral valve regurgitation.  He underwent cardiac catheterization showing mild to moderate nonobstructive coronary disease in the LAD and RCA.  Intracardiac filling pressures were elevated with a mean wedge pressure of 27 and a mean PA pressure of 48.  Mean right atrial pressure was 16.  He was treated with intravenous diuresis and improved.  Since being on diuretics he feels much better.  He is here today with his son.  He lives in [place] with a male friend.  He has 4 sons and multiple grandchildren and great-grandchildren.  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   Past Medical History:  Diagnosis Date   Chronic diastolic (congestive) heart failure (HCC)    Infected prosthetic knee joint, sequela    Mitral regurgitation    PAF (paroxysmal atrial fibrillation) (HCC)    Severe aortic stenosis     Past Surgical History:  Procedure Laterality Date   KNEE SURGERY     RIGHT/LEFT HEART CATH AND CORONARY ANGIOGRAPHY N/A 11/04/2021   Procedure: RIGHT/LEFT HEART CATH AND CORONARY ANGIOGRAPHY;  Surgeon: Sherren Mocha, MD;  Location: [facility];  Service: Cardiovascular;  Laterality: N/A;    History reviewed. No pertinent family history.  Social History   Socioeconomic History   Marital status: Unknown    Spouse name:  Not on file   Number of children: Not on file   Years of education: Not on file   Highest education level: Not on file  Occupational History   Not on file  Tobacco Use   Smoking status: Never   Smokeless tobacco: Not on file  Substance and Sexual Activity   Alcohol use: Yes   Drug use: No   Sexual activity: Not on file  Other Topics Concern   Not on file  Social History Narrative   Not on file   Social Determinants of Health   Financial Resource Strain: Not on file  Food Insecurity: Not on file  Transportation Needs: Not on file  Physical Activity: Not on file   Stress: Not on file  Social Connections: Not on file  Intimate Partner Violence: Not on file    Prior to Admission medications   Medication Sig Start Date End Date Taking? Authorizing Provider  apixaban (ELIQUIS) 5 MG TABS tablet Take 1 tablet (5 mg total) by mouth 2 (two) times daily. 11/07/21  Yes Domenic Polite, MD  B Complex Vitamins (B COMPLEX PO) Take 1 tablet by mouth daily.   Yes [provider]  bisacodyl (DULCOLAX) 5 MG EC tablet Take 10 mg by mouth daily.   Yes [provider]  Cholecalciferol (D3 PO) Take 1 capsule by mouth daily.   Yes [provider]  Cyanocobalamin (B-12 PO) Take 1 tablet by mouth daily.   Yes [provider]  furosemide (LASIX) 40 MG tablet Take 1 tablet (40 mg total) by mouth daily. 11/07/21  Yes Domenic Polite, MD  metoprolol tartrate (LOPRESSOR) 25 MG tablet Take 1/2 tablet (12.5 mg total) by mouth 2 (two) times daily. 11/07/21  Yes Domenic Polite, MD  Multiple Vitamin (MULTIVITAMIN WITH MINERALS) TABS tablet Take 1 tablet by mouth daily.   Yes [provider]  potassium chloride SA (KLOR-CON M) 20 MEQ tablet Take 1 tablet (20 mEq total) by mouth daily. 11/07/21  Yes Domenic Polite, MD    Current Outpatient Medications  Medication Sig Dispense Refill   apixaban (ELIQUIS) 5 MG TABS tablet Take 1 tablet (5 mg total) by mouth 2 (two) times daily. 60 tablet 0   B Complex Vitamins (B COMPLEX PO) Take 1 tablet by mouth daily.     bisacodyl (DULCOLAX) 5 MG EC tablet Take 10 mg by mouth daily.     Cholecalciferol (D3 PO) Take 1 capsule by mouth daily.     Cyanocobalamin (B-12 PO) Take 1 tablet by mouth daily.     furosemide (LASIX) 40 MG tablet Take 1 tablet (40 mg total) by mouth daily. 30 tablet 0   metoprolol tartrate (LOPRESSOR) 25 MG tablet Take 1/2 tablet (12.5 mg total) by mouth 2 (two) times daily. 60 tablet 0   Multiple Vitamin (MULTIVITAMIN WITH MINERALS) TABS tablet Take 1 tablet by mouth daily.      potassium chloride SA (KLOR-CON M) 20 MEQ tablet Take 1 tablet (20 mEq total) by mouth daily. 30 tablet 0   No current facility-administered medications for this visit.    No Known Allergies    Review of Systems:   General:  normal appetite, + decreased energy, no weight gain, no weight loss, no fever  Cardiac:  no chest pain with exertion, no chest pain at rest, +SOB with mild exertion, no resting SOB, no PND, + orthopnea, no palpitations, + arrhythmia, + atrial fibrillation, no LE edema, no dizzy spells, no syncope  Respiratory:  + exertional  shortness of breath, no home oxygen, no productive cough, no dry cough, no bronchitis, no wheezing, no hemoptysis, no asthma, no pain with inspiration or cough, no sleep apnea, no CPAP at night  GI:   no difficulty swallowing, no reflux, no frequent heartburn, no hiatal hernia, no abdominal pain, no constipation, no diarrhea, no hematochezia, no hematemesis, no melena  GU:   no dysuria,  no frequency, no urinary tract infection, no hematuria, no enlarged prostate, no kidney stones, no kidney disease  Vascular:  no pain suggestive of claudication, no pain in feet, no leg cramps, no varicose veins, no DVT, no non-healing foot ulcer  Neuro:   no stroke, no TIA's, no seizures, no headaches, no temporary blindness one eye,  no slurred speech, no peripheral neuropathy, no chronic pain, no instability of gait, no memory/cognitive dysfunction  Musculoskeletal: + arthritis - primarily involving the knees, no joint swelling, no myalgias, no difficulty walking, normal mobility   Skin:   no rash, no itching, no skin infections, no pressure sores or ulcerations  Psych:   no anxiety, no depression, no nervousness, no unusual recent stress  Eyes:   no blurry vision, no floaters, no recent vision changes, + wears glasses or contacts  ENT:   no hearing loss, no loose or painful teeth, no dentures, last saw dentist 2022  Hematologic:  no easy bruising, no abnormal  bleeding, no clotting disorder, no frequent epistaxis  Endocrine:  no diabetes, does not check CBG's at home     Physical Exam:   BP 118/79    Pulse 76    Resp 20    Ht 6\' 5"  (1.956 m)    Wt 273 lb (123.8 kg)    SpO2 93% Comment: RA   BMI 32.37 kg/m   General:  Large frame,  well-appearing  HEENT:  Unremarkable, NCAT, PERLA, EOMI  Neck:   no JVD, no bruits, no adenopathy   Chest:   clear to auscultation, symmetrical breath sounds, no wheezes, no rhonchi   CV:   RRR,  3/6 systolic murmur RSB, no diastolic murmur  Abdomen:  soft, non-tender, no masses   Extremities:  warm, well-perfused, pulses palpable at ankle, no lower extremity edema  Rectal/GU  Deferred  Neuro:   Grossly non-focal and symmetrical throughout  Skin:   Clean and dry, no rashes, no breakdown  Diagnostic Tests:     ECHOCARDIOGRAM REPORT         Patient Name:   GIAVANNI LANO Date of Exam: 11/02/2021  Medical Rec #:  [id number]    Height:       77.0 in  Accession #:    [id number]   Weight:       296.7 lb  Date of Birth:  1926-10-31    BSA:          2.645 m  Patient Age:    38 years     BP:           134/90 mmHg  Patient Gender: M            HR:           74 bpm.  Exam Location:  Inpatient   Procedure: 2D Echo, Cardiac Doppler and Color Doppler   Indications:    CHF-Acute Diastolic [id number]     History:        Patient has no prior history of Echocardiogram  examinations.     Sonographer:    Bernadene Person RDCS  Referring Phys: [phone]  PING T ZHANG   IMPRESSIONS     1. The aortic valve is incompletely visualized on SAX view, however,  suspect severe (vs moderate-to-severe) paradoxical low flow, low gradient  aortic stenosis with AVA 0.8cm2 by continuity, mean gradient 90mmHg, Vmax  3.37m/s, DI 0.23, SVi 22. Notably the   LVOT VTI may be underestimated due to suboptimal doppler angle.   2. Left ventricular ejection fraction, by estimation, is 55 to 60%. The  left ventricle has normal function. The left ventricle  has no regional  wall motion abnormalities. There is mild asymmetric left ventricular  hypertrophy of the basal-septal segment.  Left ventricular diastolic parameters are consistent with Grade II  diastolic dysfunction (pseudonormalization).   3. Right ventricular systolic function is mildly reduced. The right  ventricular size is normal. There is mildly elevated pulmonary artery  systolic pressure.   4. Left atrial size was mildly dilated.   5. The mitral valve is degenerative. Mild mitral valve regurgitation.  Moderate mitral annular calcification.   6. The aortic valve is tricuspid. There is moderate calcification of the  aortic valve. There is moderate thickening of the aortic valve. Aortic  valve regurgitation is mild. Severe aortic valve stenosis.   7. Aortic dilatation noted. There is moderate dilatation of the ascending  aorta, measuring 44 mm.   8. The inferior vena cava is dilated in size with <50% respiratory  variability, suggesting right atrial pressure of 15 mmHg.   Comparison(s): No prior Echocardiogram.   FINDINGS   Left Ventricle: Left ventricular ejection fraction, by estimation, is 55  to 60%. The left ventricle has normal function. The left ventricle has no  regional wall motion abnormalities. The left ventricular internal cavity  size was normal in size. There is   mild asymmetric left ventricular hypertrophy of the basal-septal segment.  Left ventricular diastolic parameters are consistent with Grade II  diastolic dysfunction (pseudonormalization).   Right Ventricle: The right ventricular size is normal. Right vetricular  wall thickness was not well visualized. Right ventricular systolic  function is mildly reduced. There is mildly elevated pulmonary artery  systolic pressure. The tricuspid regurgitant  velocity is 2.69 m/s, and with an assumed right atrial pressure of 15  mmHg, the estimated right ventricular systolic pressure is Q000111Q mmHg.   Left Atrium:  Left atrial size was mildly dilated.   Right Atrium: Right atrial size was normal in size.   Pericardium: There is no evidence of pericardial effusion.   Mitral Valve: The mitral valve is degenerative in appearance. There is  mild thickening of the mitral valve leaflet(s). There is mild  calcification of the mitral valve leaflet(s). Moderate mitral annular  calcification. Mild mitral valve regurgitation.   Tricuspid Valve: The tricuspid valve is normal in structure. Tricuspid  valve regurgitation is mild.   Aortic Valve: The aortic valve is tricuspid. There is moderate  calcification of the aortic valve. There is moderate thickening of the  aortic valve. Aortic valve regurgitation is mild. Aortic regurgitation PHT  measures 500 msec. Severe aortic stenosis is  present. Aortic valve mean gradient measures 22.0 mmHg. Aortic valve peak  gradient measures 32.8 mmHg. Aortic valve area, by VTI measures 0.83 cm.   Pulmonic Valve: The pulmonic valve was not well visualized. Pulmonic valve  regurgitation is trivial.   Aorta: The aortic root is normal in size and structure and aortic  dilatation noted. There is moderate dilatation of the ascending aorta,  measuring 44 mm.   Venous: The inferior vena cava  is dilated in size with less than 50%  respiratory variability, suggesting right atrial pressure of 15 mmHg.   IAS/Shunts: The atrial septum is grossly normal.      LEFT VENTRICLE  PLAX 2D  LVIDd:         4.70 cm      Diastology  LVIDs:         3.10 cm      LV e' medial:    4.60 cm/s  LV PW:         1.00 cm      LV E/e' medial:  33.0  LV IVS:        0.90 cm      LV e' lateral:   6.05 cm/s  LVOT diam:     2.10 cm      LV E/e' lateral: 25.1  LV SV:         58  LV SV Index:   22  LVOT Area:     3.46 cm     LV Volumes (MOD)  LV vol d, MOD A2C: 68.4 ml  LV vol d, MOD A4C: 127.0 ml  LV vol s, MOD A2C: 29.4 ml  LV vol s, MOD A4C: 55.2 ml  LV SV MOD A2C:     39.0 ml  LV SV MOD A4C:      127.0 ml  LV SV MOD BP:      53.0 ml   RIGHT VENTRICLE  RV S prime:     7.76 cm/s  TAPSE (M-mode): 1.7 cm   LEFT ATRIUM             Index        RIGHT ATRIUM           Index  LA diam:        4.40 cm 1.66 cm/m   RA Area:     17.70 cm  LA Vol (A2C):   87.4 ml 33.04 ml/m  RA Volume:   45.00 ml  17.01 ml/m  LA Vol (A4C):   95.4 ml 36.07 ml/m  LA Biplane Vol: 93.0 ml 35.16 ml/m   AORTIC VALVE  AV Area (Vmax):    0.93 cm  AV Area (Vmean):   0.83 cm  AV Area (VTI):     0.83 cm  AV Vmax:           286.33 cm/s  AV Vmean:          222.667 cm/s  AV VTI:            0.702 m  AV Peak Grad:      32.8 mmHg  AV Mean Grad:      22.0 mmHg  LVOT Vmax:         77.17 cm/s  LVOT Vmean:        53.333 cm/s  LVOT VTI:          0.168 m  LVOT/AV VTI ratio: 0.24  AI PHT:            500 msec     AORTA  Ao Root diam: 3.50 cm  Ao Asc diam:  4.40 cm   MITRAL VALVE                TRICUSPID VALVE  MV Area (PHT): 3.77 cm     TR Peak grad:   28.9 mmHg  MV Decel Time: 201 msec     TR Vmax:        269.00 cm/s  MR Peak grad: 112.8 mmHg  MR Mean grad: 73.0 mmHg     SHUNTS  MR Vmax:      531.00 cm/s   Systemic VTI:  0.17 m  MR Vmean:     411.0 cm/s    Systemic Diam: 2.10 cm  MV E velocity: 152.00 cm/s  MV A velocity: 85.00 cm/s  MV E/A ratio:  1.79   Gwyndolyn Kaufman MD  Electronically signed by Gwyndolyn Kaufman MD  Signature Date/Time: 11/02/2021/4:50:05 PM         Final       Physicians  Panel Physicians Referring Physician Case Authorizing Physician  Sherren Mocha, MD (Primary)     Procedures  RIGHT/LEFT HEART CATH AND CORONARY ANGIOGRAPHY   Conclusion      Mid RCA lesion is 50% stenosed.   Mid LAD lesion is 40% stenosed.   1.  Mild to moderate nonobstructive coronary artery disease with moderate ostial RCA stenosis, mild to moderate mid RCA stenosis, and an otherwise patent right coronary artery. 2.  Patent left main with mild plaquing 3.  Patent LAD with mild mid  vessel stenosis of 40% 4.  Patent left circumflex with mild luminal irregularity. 5.  Calcified, restricted aortic valve with peak to peak gradient 24 mmHg 6.  Elevated intracardiac filling pressures with mean wedge pressure of 27, mean PA pressure of 48, and right atrial mean pressure of 16.   Recommend: Continued TAVR evaluation, continued IV diuresis for treatment of heart failure, okay to start apixaban tomorrow morning.   Indications  Severe aortic stenosis [I35.0 (ICD-10-CM)]   Procedural Details  Technical Details INDICATION: 88 year old gentleman admitted with congestive heart failure, found to have aortic stenosis, with noninvasive assessment suspicious for severe low-flow low gradient aortic stenosis.  The patient is referred for cardiac catheterization for further evaluation.  PROCEDURAL DETAILS: There was an indwelling IV in a right antecubital vein. Using normal sterile technique, the IV was changed out for a 5 Fr brachial sheath over a 0.018 inch wire. The right wrist was then prepped, draped, and anesthetized with 1% lidocaine. Using the modified Seldinger technique a 5/6 French Slender sheath was placed in the right radial artery. Intra-arterial verapamil was administered through the radial artery sheath.  There is a right radial loop present.  I try to traverse this with a versa core wire and a Glidewire.  This was unsuccessful.  Radial access was then abandoned and attention was turned to the femoral area.  Using ultrasound guidance and a micropuncture technique, the right femoral artery is accessed via a front wall puncture.  A 5 French sheath is inserted.  Standard Judkins catheters are used for the cardiac catheterization procedure.  The aortic valve is crossed with an AL 2 catheter and exchange length straight wire and then changed out for a pigtail catheter.  A Swan-Ganz catheter was used for the right heart catheterization. Standard protocol was followed for recording of right  heart pressures and sampling of oxygen saturations. Fick cardiac output was calculated. Aortic valve pullback is performed. There were no immediate procedural complications. Mynx closure is used for femoral hemostasis. A TR band is used for radial hemostasis. The patient was transferred to the post catheterization recovery area for further monitoring.      Estimated blood loss <50 mL.   During this procedure medications were administered to achieve and maintain moderate conscious sedation while the patient's heart rate, blood pressure, and oxygen saturation were continuously monitored and I was present face-to-face 100%  of this time.   Medications (Filter: Administrations occurring from 1332 to 1525 on 11/04/21)  important  Continuous medications are totaled by the amount administered until 11/04/21 1525.   Heparin (Porcine) in NaCl 1000-0.9 UT/500ML-% SOLN (mL) Total volume:  1,000 mL Date/Time Rate/Dose/Volume Action   11/04/21 1337 500 mL Given   1337 500 mL Given    0.9 %  sodium chloride infusion (mL/hr) Total dose:  Cannot be calculated* *Continuous medication not stopped within the calculation time range. Date/Time Rate/Dose/Volume Action   11/04/21 1345 10 mL/hr New Bag/Given    fentaNYL (SUBLIMAZE) injection (mcg) Total dose:  25 mcg Date/Time Rate/Dose/Volume Action   11/04/21 1401 25 mcg Given    midazolam (VERSED) injection (mg) Total dose:  1 mg Date/Time Rate/Dose/Volume Action   11/04/21 1401 1 mg Given    lidocaine (PF) (XYLOCAINE) 1 % injection (mL) Total volume:  20 mL Date/Time Rate/Dose/Volume Action   11/04/21 1403 2 mL Given   1404 3 mL Given   1432 15 mL Given    iohexol (OMNIPAQUE) 350 MG/ML injection (mL) Total volume:  60 mL Date/Time Rate/Dose/Volume Action   11/04/21 1500 60 mL Given    Radial Cocktail/Verapamil only (mL) Total volume:  10 mL Date/Time Rate/Dose/Volume Action   11/04/21 1406 10 mL Given    0.9 %  sodium chloride infusion  (mL) Total dose:  Cannot be calculated* Dosing weight:  129.3 *Administration dose not documented Date/Time Rate/Dose/Volume Action   11/04/21 1332 *Not included in total MAR Hold   1523 *Not included in total MAR Unhold    acetaminophen (TYLENOL) tablet 650 mg (mg) Total dose:  Cannot be calculated* Dosing weight:  129.3 *Administration dose not documented Date/Time Rate/Dose/Volume Action   11/04/21 1332 *Not included in total MAR Hold   1523 *Not included in total MAR Unhold    guaiFENesin (MUCINEX) 12 hr tablet 1,200 mg (mg) Total dose:  Cannot be calculated* Dosing weight:  129.3 *Administration dose not documented Date/Time Rate/Dose/Volume Action   11/04/21 1332 *Not included in total MAR Hold   1523 *Not included in total MAR Unhold    ipratropium-albuterol (DUONEB) 0.5-2.5 (3) MG/3ML nebulizer solution 3 mL (mL) Total dose:  Cannot be calculated* Dosing weight:  131 *Administration dose not documented Date/Time Rate/Dose/Volume Action   11/04/21 1332 *Not included in total MAR Hold   1523 *Not included in total MAR Unhold    metoprolol tartrate (LOPRESSOR) tablet 12.5 mg (mg) Total dose:  Cannot be calculated* Dosing weight:  129.3 *Administration dose not documented Date/Time Rate/Dose/Volume Action   11/04/21 1332 *Not included in total MAR Hold   1523 *Not included in total MAR Unhold    ondansetron (ZOFRAN) injection 4 mg (mg) Total dose:  Cannot be calculated* Dosing weight:  129.3 *Administration dose not documented Date/Time Rate/Dose/Volume Action   11/04/21 1332 *Not included in total MAR Hold   1523 *Not included in total MAR Unhold    polyethylene glycol (MIRALAX / GLYCOLAX) packet 17 g (g) Total dose:  Cannot be calculated* Dosing weight:  134.6 *Administration dose not documented Date/Time Rate/Dose/Volume Action   11/04/21 1332 *Not included in total MAR Hold   1523 *Not included in total MAR Unhold    sodium chloride flush (NS) 0.9 % injection  3 mL (mL) Total dose:  Cannot be calculated* Dosing weight:  129.3 *Administration dose not documented Date/Time Rate/Dose/Volume Action   11/04/21 1332 *Not included in total MAR Hold   1523 *Not included in total MAR Unhold  sodium chloride flush (NS) 0.9 % injection 3 mL (mL) Total dose:  Cannot be calculated* Dosing weight:  129.3 *Administration dose not documented Date/Time Rate/Dose/Volume Action   11/04/21 1332 *Not included in total MAR Hold   1523 *Not included in total MAR Unhold    sodium chloride flush (NS) 0.9 % injection 3 mL (mL) Total dose:  Cannot be calculated* Dosing weight:  132.9 *Administration dose not documented Date/Time Rate/Dose/Volume Action   11/04/21 1332 *Not included in total MAR Hold   1523 *Not included in total MAR Unhold    Sedation Time  Sedation Time Physician-1: 43 minutes 48 seconds Radiation/Fluoro  Fluoro time: 13.8 (min) DAP: 32.4 (Gycm2) Cumulative Air Kerma: Q000111Q (mGy) Complications  Complications documented before study signed (11/04/2021  3:34 PM)   RIGHT/LEFT HEART CATH AND CORONARY ANGIOGRAPHY  None Documented by Karma Greaser, RT 11/04/2021  3:29 PM  Date Found: 11/04/2021  Time Range: Intraprocedure       Coronary Findings  Diagnostic Dominance: Right Left Main  There is mild diffuse disease throughout the vessel.    Left Anterior Descending  There is mild diffuse disease throughout the vessel.  Mid LAD lesion is 40% stenosed.    Left Circumflex  There is mild diffuse disease throughout the vessel.    Right Coronary Artery  Vessel is large. There is mild diffuse disease throughout the vessel. Large, dominant vessel. 40 to 50% ostial stenosis is noted. No significant pressure dampening is noted with the catheter into the vessel. Otherwise there is diffuse plaquing throughout the RCA.  Mid RCA lesion is 50% stenosed. The lesion is moderately calcified.    Intervention   No interventions have been documented.    Left Heart  Aortic Valve The aortic valve is calcified. There is restricted aortic valve motion.   Coronary Diagrams  Diagnostic Dominance: Right Intervention  Implants     Vascular Products  Closure Mynx Control 57f [id number] - Implanted Inventory item: CLOSURE [facility] CONTROL 81F Model/Cat number: [id number]  Manufacturer: [employer] Lot number: [id number]  Device identifier: [id number] Device identifier type: GS1  Area Of Implantation: Femoral Artery    GUDID Information  Request status Successful    Brand name: MYNX CONTROL Version/Model: [id number]  Company name: [employer]. MRI safety info as of 11/04/21: MR Safe  Contains dry or latex rubber: No    GMDN P.T. name: Wound hydrogel dressing, non-antimicrobial     As of 11/04/2021  Status: Implanted       Syngo Images   Show images for CARDIAC CATHETERIZATION Images on Long Term Storage   Show images for Philippe, Poston to Procedure Log  Procedure Log    Hemo Data  Flowsheet Row Most Recent Value  Fick Cardiac Output 5.93 L/min  Fick Cardiac Output Index 2.25 (L/min)/BSA  Aortic Mean Gradient 25.88 mmHg  Aortic Peak Gradient 21 mmHg  Aortic Valve Area 1.88  Aortic Value Area Index 0.72 cm2/BSA  RA A Wave 15 mmHg  RA V Wave 18 mmHg  RA Mean 16 mmHg  RV Systolic Pressure 65 mmHg  RV Diastolic Pressure 14 mmHg  RV EDP 19 mmHg  PA Systolic Pressure 65 mmHg  PA Diastolic Pressure 35 mmHg  PA Mean 48 mmHg  PW A Wave 28 mmHg  PW V Wave 21 mmHg  PW Mean 27 mmHg  AO Systolic Pressure 123XX123 mmHg  AO Diastolic Pressure 90 mmHg  AO Mean 99991111 mmHg  LV Systolic Pressure [id number]  mmHg  LV Diastolic Pressure 14 mmHg  LV EDP 21 mmHg  AOp Systolic Pressure [id number] mmHg  AOp Diastolic Pressure 82 mmHg  AOp Mean Pressure [id number] mmHg  LVp Systolic Pressure 123XX123 mmHg  LVp Diastolic Pressure 11 mmHg  LVp EDP Pressure 24 mmHg  Arterial Extended Systolic Pressure 0000000 mmHg  Arterial Extended Diastolic Pressure 81  mmHg  Arterial Extended Mean Pressure 104 mmHg  QP/QS 1  TPVR Index 21.3 HRUI  TSVR Index 47.92 HRUI  PVR SVR Ratio 0.23  TPVR/TSVR Ratio 0.44    Addendum  ADDENDUM REPORT: 11/07/2021 08:32   ADDENDUM: Extracardiac findings are described separately under dictation for contemporaneously obtained CTA chest, abdomen and pelvis dated 11/05/2021. Please see that dictation for full description of relevant extracardiac findings.     Electronically Signed   By: Vinnie Langton M.D.   On: 11/07/2021 08:32    Addended by Etheleen Mayhew, MD on 11/07/2021  8:34 AM   Study Result  Narrative & Impression  CLINICAL DATA:  Severe Aortic Stenosis.   EXAM: Cardiac TAVR CT   TECHNIQUE: A non-contrast, gated CT scan was obtained with axial slices of 3 mm through the heart for aortic valve calcium scoring. A 120 kV retrospective, gated, contrast cardiac scan was obtained. Gantry rotation speed was 250 msecs and collimation was 0.6 mm. Nitroglycerin was not given. The 3D data set was reconstructed in 5% intervals of the 0-95% of the R-R cycle. Systolic and diastolic phases were analyzed on a dedicated workstation using MPR, MIP, and VRT modes. The patient received 100 cc of contrast.   FINDINGS: Image quality: Excellent.   Noise artifact is: Limited.   Valve Morphology: The aortic valve sis tricuspid. The leaflets are severely calcified with bulky calcification of the [place] that extends to the annulus. There is acquired fusion of the NCC/LCC.   Aortic Valve Calcium score: 2247   Aortic annular dimension:   Phase assessed: 35%   Annular area: 511 mm2   Annular perimeter: 81.2 mm   Max diameter: 27.8 mm   Min diameter: 23.5 mm   Annular and subannular calcification: A single, moderate, protruding calcification is noted under the [place].   Membranous septum length: 13.1 mm. Poor quality study for this measurement.   Optimal coplanar projection: LAO 5 CAU 0   Coronary  Artery Height above Annulus:   Left Main: 15.9 mm   Right Coronary: 21.7 mm   Sinus of Valsalva Measurements:   Non-coronary: 37.5 mm   Right-coronary: 34.4 mm   Left-coronary: 35.0 mm   Sinus of Valsalva Height:   Non-coronary: 25.7 mm   Right-coronary: 26.5 mm   Left-coronary: 23.4 mm   Sinotubular Junction: 35 mm   Ascending Thoracic Aorta: 42 mm   Coronary Arteries: Normal coronary origin. Right dominance. The study was performed without use of NTG and is insufficient for plaque evaluation. Please refer to recent cardiac catheterization for coronary assessment. 3-vessel coronary calcifications.   Cardiac Morphology:   Right Atrium: Right atrial size is within normal limits.   Right Ventricle: The right ventricular cavity is dilated.   Left Atrium: Left atrial size is normal in size. There is mixing artifact in the distal LAA and cannot exclude thrombus.   Left Ventricle: The ventricular cavity size is within normal limits. There are no stigmata of prior infarction. There is no abnormal filling defect. Mildly reduced left ventricular function, LVEF=40%. Global hypokinesis.   Pulmonary arteries: Normal in size without proximal filling defect.  Pulmonary veins: Normal pulmonary venous drainage.   Pericardium: Normal thickness with no significant effusion or calcium present.   Mitral Valve: The mitral valve is normal structure without significant calcification.   Extra-cardiac findings: Small right pleural effusion. See attached radiology report for non-cardiac structures.   IMPRESSION: 1. Severely calcified tricuspid aortic valve with bulky calcification of the [place].   2. Annular measurements appropriate for 26 mm S3 (511 m2).   3. A single, moderate, protruding calcification is noted under the [place].   4. Sufficient coronary to annulus distance.   5. Optimal Fluoroscopic Angle for Delivery: LAO 5 CAU 0   6. Mildly reduced left ventricular  function, LVEF=40%. Global hypokinesis.   7. Ascending aortic aneurysm up to 42 mm.   8. There is mixing artifact in the distal LAA and cannot exclude thrombus.   [place] T. Audie Box, MD   Electronically Signed: By: Eleonore Chiquito M.D. On: 11/06/2021 16:58       Narrative & Impression  CLINICAL DATA:  89 year old male with history of severe aortic stenosis. Preprocedural study prior to potential transcatheter aortic valve replacement (TAVR) procedure.   EXAM: CT ANGIOGRAPHY CHEST, ABDOMEN AND PELVIS   TECHNIQUE: Multidetector CT imaging through the chest, abdomen and pelvis was performed using the standard protocol during bolus administration of intravenous contrast. Multiplanar reconstructed images and MIPs were obtained and reviewed to evaluate the vascular anatomy.   CONTRAST:  163mL OMNIPAQUE IOHEXOL 350 MG/ML SOLN   COMPARISON:  No priors.   FINDINGS: CTA CHEST FINDINGS   Cardiovascular: Heart size is mildly enlarged with left atrial dilatation, but normal left ventricular size. There is no significant pericardial fluid, thickening or pericardial calcification. There is aortic atherosclerosis, as well as atherosclerosis of the great vessels of the mediastinum and the coronary arteries, including calcified atherosclerotic plaque in the left main, left anterior descending, left circumflex and right coronary arteries. Severe thickening and calcification of the aortic valve. Mild calcifications of the mitral annulus.   Mediastinum/Lymph Nodes: No pathologically enlarged mediastinal or hilar lymph nodes. Esophagus is unremarkable in appearance. No axillary lymphadenopathy.   Lungs/Pleura: Small right and trace left pleural effusions lying dependently. Patchy areas of peribronchovascular predominant ground-glass attenuation are noted in the central aspects of the lungs bilaterally, most severe throughout the mid to upper lungs. No definite suspicious appearing  pulmonary nodules or masses are noted.   Musculoskeletal/Soft Tissues: There are no aggressive appearing lytic or blastic lesions noted in the visualized portions of the skeleton.   CTA ABDOMEN AND PELVIS FINDINGS   Hepatobiliary: No suspicious cystic or solid hepatic lesions. No intra or extrahepatic biliary ductal dilatation. Gallbladder is normal in appearance.   Pancreas: No pancreatic mass. No pancreatic ductal dilatation. No pancreatic or peripancreatic fluid collections or inflammatory changes.   Spleen: Unremarkable.   Adrenals/Urinary Tract: Generalized cortical atrophy in the kidneys bilaterally. No suspicious renal lesions. No hydroureteronephrosis. Bilateral adrenal glands are normal in appearance. 1.9 cm diverticulum in the right-side of the urinary bladder incidentally noted. Urinary bladder is otherwise unremarkable in appearance.   Stomach/Bowel: The appearance of the stomach is normal. There is no pathologic dilatation of small bowel or colon. The appendix is not confidently identified and may be surgically absent. Regardless, there are no inflammatory changes noted adjacent to the cecum to suggest the presence of an acute appendicitis at this time.   Vascular/Lymphatic: Vascular findings and measurements pertinent to potential TAVR procedure, as detailed below. No aneurysm or dissection noted in the abdominal or  pelvic vasculature. Intermediate attenuation fluid surrounding the right superficial femoral and common femoral arteries, likely residual blood products related to recent vascular access. No lymphadenopathy noted in the abdomen or pelvis.   Reproductive: Prostate gland and seminal vesicles are unremarkable in appearance.   Other: No significant volume of ascites.  No pneumoperitoneum.   Musculoskeletal: There are no aggressive appearing lytic or blastic lesions noted in the visualized portions of the skeleton.   VASCULAR MEASUREMENTS PERTINENT  TO TAVR:   AORTA:   Minimal Aortic Diameter-19 x 17 mm   Severity of Aortic Calcification-mild   RIGHT PELVIS:   Right Common Iliac Artery -   Minimal Diameter-12.0 x 12.3 mm   Tortuosity-mild   Calcification-minimal   Right External Iliac Artery -   Minimal Diameter-10.3 x 9.5 mm   Tortuosity-severe   Calcification-none   Right Common Femoral Artery -   Minimal Diameter-10.4 x 10.7 mm   Tortuosity-mild   Calcification-minimal   LEFT PELVIS:   Left Common Iliac Artery -   Minimal Diameter-14.1 x 12.7 mm   Tortuosity-moderate   Calcification-mild   Left External Iliac Artery -   Minimal Diameter-9.7 x 9.7 mm   Tortuosity-severe   Calcification-none   Left Common Femoral Artery -   Minimal Diameter-10.6 x 10.1 mm   Tortuosity-mild   Calcification-minimal   Review of the MIP images confirms the above findings.   IMPRESSION: 1. Vascular findings and measurements pertinent to potential TAVR procedure, as detailed above. 2. Severe thickening calcification of the aortic valve, compatible with reported clinical history of severe aortic stenosis. 3. The appearance of the lungs could reflect pulmonary edema. Given the presence of small bilateral pleural effusions and normal left ventricular size, diastolic congestive heart failure should be considered. Alternatively, if there is history of fever and sputum production, multilobar bilateral bronchopneumonia should be considered. 4. Aortic atherosclerosis, in addition to left main and three-vessel coronary artery disease. 5. Additional incidental findings, as above.     Electronically Signed   By: Vinnie Langton M.D.   On: 11/07/2021 08:47     STS Risk Calculator: Procedure: AV Replacement  Risk of Mortality: 4.013% Renal Failure: 4.002% Permanent Stroke: 2.124% Prolonged Ventilation: 12.162% DSW Infection: 0.137% Reoperation: 4.221% Morbidity or Mortality: 19.886% Short Length  of Stay: 14.098% Long Length of Stay: 11.598%   Impression:  This 88 year old active gentleman has stage D 2 severe, symptomatic, low-flow/low gradient aortic stenosis with New York Heart Association class IIl symptoms of exertional fatigue and shortness of breath consistent with chronic diastolic congestive heart failure.  He was recently admitted with congestive heart failure and acute pulmonary edema with acute hypoxic respiratory failure.  I have personally reviewed his 2D echocardiogram, cardiac catheterization, and CTA studies.  His echocardiogram shows a trileaflet aortic valve with moderate thickening and calcification with restricted leaflet mobility.  His mean gradient was only 22 mmHg but his valve looks severely stenotic.  His stroke-volume index was low at 22.  Cardiac catheterization shows mild to moderate nonobstructive coronary disease with elevated filling pressures consistent with diastolic congestive heart failure.  I agree that aortic valve replacement is indicated in this patient for relief of his symptoms and to prevent progressive left ventricular deterioration.  Given his advanced age I think that transcatheter aortic valve replacement would be the best treatment for him.  His gated cardiac CTA shows anatomy suitable for TAVR using a SAPIEN 3 valve.  His abdominal and pelvic CTA shows adequate pelvic vascular anatomy to allow transfemoral  insertion.  The patient and his son were counseled at length regarding treatment alternatives for management of severe symptomatic aortic stenosis. The risks and benefits of surgical intervention has been discussed in detail. Long-term prognosis with medical therapy was discussed. Alternative approaches such as conventional surgical aortic valve replacement, transcatheter aortic valve replacement, and palliative medical therapy were compared and contrasted at length. This discussion was placed in the context of the patient's own specific clinical  presentation and past medical history. All of their questions have been addressed.   Following the decision to proceed with transcatheter aortic valve replacement, a discussion was held regarding what types of management strategies would be attempted intraoperatively in the event of life-threatening complications, including whether or not the patient would be considered a candidate for the use of cardiopulmonary bypass and/or conversion to open sternotomy for attempted surgical intervention.  At his advanced age I do not think he is a candidate for emergent sternotomy to manage any intraoperative complications.  The patient is aware of the fact that transient use of cardiopulmonary bypass may be necessary. The patient has been advised of a variety of complications that might develop including but not limited to risks of death, stroke, paravalvular leak, aortic dissection or other major vascular complications, aortic annulus rupture, device embolization, cardiac rupture or perforation, mitral regurgitation, acute myocardial infarction, arrhythmia, heart block or bradycardia requiring permanent pacemaker placement, congestive heart failure, respiratory failure, renal failure, pneumonia, infection, other late complications related to structural valve deterioration or migration, or other complications that might ultimately cause a temporary or permanent loss of functional independence or other long term morbidity. The patient provides full informed consent for the procedure as described and all questions were answered.      Plan:  He will be scheduled for transfemoral TAVR using a SAPIEN 3 valve on 11/15/2021.  I spent 60 minutes performing this consultation and > 50% of this time was spent face to face counseling and coordinating the care of this patient's severe symptomatic aortic stenosis.   Gaye Pollack, MD 11/09/2021   ____________________________  Pre Surgical Assessment: 5 M Walk Test    17M=16.34ft   5 Meter Walk Test- trial 1: 3.93 seconds 5 Meter Walk Test- trial 2: 4.59 seconds 5 Meter Walk Test- trial 3: 4.53 seconds 5 Meter Walk Test Average: 4.35 seconds

## 2021-11-11 NOTE — Progress Notes (Signed)
Surgical Instructions    Your procedure is scheduled on 11/15/21.  Report to [facility] Main Entrance "A" at 7:00 A.M., then check in with the Admitting office.  Call this number if you have problems the morning of surgery:  [phone]   If you have any questions prior to your surgery date call [phone]: Open Monday-Friday 8am-4pm    Remember:  Do not eat or drink after midnight the night before your surgery     STOP now taking any Aspirin (unless otherwise instructed by your surgeon), Aleve, Naproxen, Ibuprofen, Motrin, Advil, Goody's, BC's, all herbal medications, fish oil, and all non-prescription vitamins.   Stop taking Eliquis on 1/12 (Thursday). You will take your last dose on 1/11 (Wednesday).   Continue taking all other medications without change through the day before surgery. On the morning of surgery do not take any medications.   After your COVID test   You are not required to quarantine however you are required to wear a well-fitting mask when you are out and around people not in your household.  If your mask becomes wet or soiled, replace with a new one.  Wash your hands often with soap and water for 20 seconds or clean your hands with an alcohol-based hand sanitizer that contains at least 60% alcohol.  Do not share personal items.  Notify your provider: if you are in close contact with someone who has COVID  or if you develop a fever of 100.4 or greater, sneezing, cough, sore throat, shortness of breath or body aches.             Do not wear jewelry or makeup Do not wear lotions, powders, perfumes/colognes, or deodorant. Men may shave face and neck. Do not bring valuables to the hospital.              [facility] is not responsible for any belongings or valuables.  Do NOT Smoke (Tobacco/Vaping)  24 hours prior to your procedure  If you use a CPAP at night, you may bring your mask for your overnight stay.   Contacts, glasses, hearing aids,  dentures or partials may not be worn into surgery, please bring cases for these belongings   For patients admitted to the hospital, discharge time will be determined by your treatment team.   Patients discharged the day of surgery will not be allowed to drive home, and someone needs to stay with them for 24 hours.  NO VISITORS WILL BE ALLOWED IN PRE-OP WHERE PATIENTS ARE PREPPED FOR SURGERY.  ONLY 1 SUPPORT PERSON MAY BE PRESENT IN THE WAITING ROOM WHILE YOU ARE IN SURGERY.  IF YOU ARE TO BE ADMITTED, ONCE YOU ARE IN YOUR ROOM YOU WILL BE ALLOWED TWO (2) VISITORS. 1 (ONE) VISITOR MAY STAY OVERNIGHT BUT MUST ARRIVE TO THE ROOM BY 8pm.  Minor children may have two parents present. Special consideration for safety and communication needs will be reviewed on a case by case basis.  Special instructions:    Oral Hygiene is also important to reduce your risk of infection.  Remember - BRUSH YOUR TEETH THE MORNING OF SURGERY WITH YOUR REGULAR TOOTHPASTE   [facility]- Preparing For Surgery  Before surgery, you can play an important role. Because skin is not sterile, your skin needs to be as free of germs as possible. You can reduce the number of germs on your skin by washing with CHG (chlorahexidine gluconate) Soap before surgery.  CHG is an antiseptic cleaner which kills germs  and bonds with the skin to continue killing germs even after washing.     Please do not use if you have an allergy to CHG or antibacterial soaps. If your skin becomes reddened/irritated stop using the CHG.  Do not shave (including legs and underarms) for at least 48 hours prior to first CHG shower. It is OK to shave your face.  Please follow these instructions carefully.     Shower the NIGHT BEFORE SURGERY and the MORNING OF SURGERY with CHG Soap.   If you chose to wash your hair, wash your hair first as usual with your normal shampoo. After you shampoo, rinse your hair and body thoroughly to remove the shampoo.  Then [employer] and genitals (private parts) with your normal soap and rinse thoroughly to remove soap.  After that Use CHG Soap as you would any other liquid soap. You can apply CHG directly to the skin and wash gently with a scrungie or a clean washcloth.   Apply the CHG Soap to your body ONLY FROM THE NECK DOWN.  Do not use on open wounds or open sores. Avoid contact with your eyes, ears, mouth and genitals (private parts). Wash Face and genitals (private parts)  with your normal soap.   Wash thoroughly, paying special attention to the area where your surgery will be performed.  Thoroughly rinse your body with warm water from the neck down.  DO NOT shower/wash with your normal soap after using and rinsing off the CHG Soap.  Pat yourself dry with a CLEAN TOWEL.  Wear CLEAN PAJAMAS to bed the night before surgery  Place CLEAN SHEETS on your bed the night before your surgery  DO NOT SLEEP WITH PETS.   Day of Surgery: Take a shower with CHG soap. Wear Clean/Comfortable clothing the morning of surgery Do not apply any deodorants/lotions.   Remember to brush your teeth WITH YOUR REGULAR TOOTHPASTE.   Please read over the following fact sheets that you were given.

## 2021-11-13 ENCOUNTER — Encounter (HOSPITAL_BASED_OUTPATIENT_CLINIC_OR_DEPARTMENT_OTHER): Payer: Self-pay

## 2021-11-14 ENCOUNTER — Ambulatory Visit (HOSPITAL_COMMUNITY)
Admission: RE | Admit: 2021-11-14 | Discharge: 2021-11-14 | Disposition: A | Discharge status: Home / Self Care | Payer: Medicare Other | Source: Ambulatory Visit | Attending: Cardiovascular Disease | Admitting: Cardiovascular Disease

## 2021-11-14 ENCOUNTER — Encounter (HOSPITAL_BASED_OUTPATIENT_CLINIC_OR_DEPARTMENT_OTHER): Payer: Self-pay

## 2021-11-14 ENCOUNTER — Encounter (HOSPITAL_COMMUNITY)
Admission: RE | Admit: 2021-11-14 | Discharge: 2021-11-14 | Disposition: A | Discharge status: Home / Self Care | Payer: Medicare Other | Source: Ambulatory Visit | Attending: Cardiovascular Disease | Admitting: Cardiovascular Disease

## 2021-11-14 ENCOUNTER — Encounter (HOSPITAL_COMMUNITY): Payer: Self-pay | Admitting: Cardiovascular Disease

## 2021-11-14 ENCOUNTER — Encounter (HOSPITAL_COMMUNITY): Payer: Self-pay

## 2021-11-14 ENCOUNTER — Other Ambulatory Visit: Payer: Self-pay

## 2021-11-14 VITALS — BP 130/80 | HR 87 | Temp 97.9°F | Resp 18 | Ht 77.0 in | Wt 282.4 lb

## 2021-11-14 DIAGNOSIS — I350 Nonrheumatic aortic (valve) stenosis: Secondary | ICD-10-CM

## 2021-11-14 DIAGNOSIS — Z01818 Encounter for other preprocedural examination: Secondary | ICD-10-CM | POA: Insufficient documentation

## 2021-11-14 DIAGNOSIS — Z20822 Contact with and (suspected) exposure to covid-19: Secondary | ICD-10-CM | POA: Insufficient documentation

## 2021-11-14 HISTORY — DX: Dyspnea, unspecified: R06.00

## 2021-11-14 LAB — CBC
HCT: 41.2 % (ref 39.0–52.0)
Hemoglobin: 13.8 g/dL (ref 13.0–17.0)
MCH: 31.9 pg (ref 26.0–34.0)
MCHC: 33.5 g/dL (ref 30.0–36.0)
MCV: 95.2 fL (ref 80.0–100.0)
Platelets: 278 10*3/uL (ref 150–400)
RBC: 4.33 MIL/uL (ref 4.22–5.81)
RDW: 13.8 % (ref 11.5–15.5)
WBC: 6.6 10*3/uL (ref 4.0–10.5)
nRBC: 0 % (ref 0.0–0.2)

## 2021-11-14 LAB — COMPREHENSIVE METABOLIC PANEL
ALT: 23 U/L (ref 0–44)
AST: 33 U/L (ref 15–41)
Albumin: 3.5 g/dL (ref 3.5–5.0)
Alkaline Phosphatase: 96 U/L (ref 38–126)
Anion gap: 11 (ref 5–15)
BUN: 16 mg/dL (ref 8–23)
CO2: 21 mmol/L — ABNORMAL LOW (ref 22–32)
Calcium: 9.2 mg/dL (ref 8.9–10.3)
Chloride: 102 mmol/L (ref 98–111)
Creatinine, Ser: 0.88 mg/dL (ref 0.61–1.24)
GFR, Estimated: >60 mL/min (ref >60)
Glucose, Bld: 113 mg/dL — ABNORMAL HIGH (ref 70–99)
Potassium: 4.2 mmol/L (ref 3.5–5.1)
Sodium: 134 mmol/L — ABNORMAL LOW (ref 135–145)
Total Bilirubin: 1.5 mg/dL — ABNORMAL HIGH (ref 0.3–1.2)
Total Protein: 7 g/dL (ref 6.5–8.1)

## 2021-11-14 LAB — BLOOD GAS, ARTERIAL
Acid-base deficit: 1.1 mmol/L (ref 0.0–2.0)
Bicarbonate: 22.2 mmol/L (ref 20.0–28.0)
Drawn by: 58793
FIO2: 21
O2 Saturation: 98.6 %
Patient temperature: 37
pCO2 arterial: 31.6 mmHg — ABNORMAL LOW (ref 32.0–48.0)
pH, Arterial: 7.461 — ABNORMAL HIGH (ref 7.350–7.450)
pO2, Arterial: 105 mmHg (ref 83.0–108.0)

## 2021-11-14 LAB — URINALYSIS, ROUTINE W REFLEX MICROSCOPIC
Bilirubin Urine: NEGATIVE
Glucose, UA: NEGATIVE mg/dL
Hgb urine dipstick: NEGATIVE
Ketones, ur: NEGATIVE mg/dL
Nitrite: NEGATIVE
Protein, ur: NEGATIVE mg/dL
Specific Gravity, Urine: 1.02 (ref 1.005–1.030)
pH: 6 (ref 5.0–8.0)

## 2021-11-14 LAB — URINALYSIS, MICROSCOPIC (REFLEX)

## 2021-11-14 LAB — SURGICAL PCR SCREEN
MRSA, PCR: NEGATIVE
Staphylococcus aureus: NEGATIVE

## 2021-11-14 LAB — SARS CORONAVIRUS 2 (TAT 6-24 HRS): SARS Coronavirus 2: NEGATIVE

## 2021-11-14 LAB — PROTIME-INR
INR: 1 (ref 0.8–1.2)
Prothrombin Time: 12.8 seconds (ref 11.4–15.2)

## 2021-11-14 LAB — BRAIN NATRIURETIC PEPTIDE: B Natriuretic Peptide: 114.4 pg/mL — ABNORMAL HIGH (ref 0.0–100.0)

## 2021-11-14 MED ORDER — DEXMEDETOMIDINE HCL IN NACL 400 MCG/100ML IV SOLN
0.1000 ug/kg/h | INTRAVENOUS | Status: AC
  Administered 2021-11-15: 64.04 ug via INTRAVENOUS
  Administered 2021-11-15: 1 ug/kg/h via INTRAVENOUS
  Filled 2021-11-14 (×2): qty 100

## 2021-11-14 MED ORDER — CEFAZOLIN IN SODIUM CHLORIDE 3-0.9 GM/100ML-% IV SOLN
3.0000 g | INTRAVENOUS | Status: AC
  Administered 2021-11-15: 3 g via INTRAVENOUS
  Filled 2021-11-14: qty 100

## 2021-11-14 MED ORDER — POTASSIUM CHLORIDE 2 MEQ/ML IV SOLN
80.0000 meq | INTRAVENOUS | Status: DC
  Filled 2021-11-14: qty 40

## 2021-11-14 MED ORDER — NOREPINEPHRINE 4 MG/250ML-% IV SOLN
0.0000 ug/min | INTRAVENOUS | Status: AC
  Administered 2021-11-15: 2 ug/min via INTRAVENOUS
  Filled 2021-11-14: qty 250

## 2021-11-14 MED ORDER — MAGNESIUM SULFATE 50 % IJ SOLN
40.0000 meq | INTRAMUSCULAR | Status: DC
  Filled 2021-11-14: qty 9.85

## 2021-11-14 MED ORDER — HEPARIN 30,000 UNITS/1000 ML (OHS) CELLSAVER SOLUTION
Status: DC
  Filled 2021-11-14: qty 1000

## 2021-11-14 NOTE — Progress Notes (Signed)
PCP - Dr. Tilden Dome? Pt does not see this doctor regularly. States it has been around 10 years since last visit. Cardiologist - denies.  PPM/ICD - n/s  Chest x-ray - 11/14/21 EKG - 11/02/21 Stress Test - denies ECHO - 11/02/21 Cardiac Cath -11/04/21   Sleep Study - denies CPAP - denies  Blood Thinner Instructions: Eliquis: LD 11/09/21 Aspirin Instructions:n/a  NPO at MD  COVID TEST- 11/14/21 done in PAT.  Anesthesia review: Yes  Patient denies shortness of breath, fever, cough and chest pain at PAT appointment   All instructions explained to the patient, with a verbal understanding of the material. Patient agrees to go over the instructions while at home for a better understanding. Patient also instructed to self quarantine after being tested for COVID-19. The opportunity to ask questions was provided.

## 2021-11-14 NOTE — Anesthesia Preprocedure Evaluation (Addendum)
Anesthesia Evaluation  Patient identified by MRN, date of birth, ID band Patient awake    Reviewed: Allergy & Precautions, NPO status , Patient's Chart, lab work & pertinent test results  History of Anesthesia Complications Negative for: history of anesthetic complications  Airway Mallampati: III  TM Distance: >3 FB Neck ROM: Full    Dental  (+) Dental Advisory Given, Teeth Intact   Pulmonary neg pulmonary ROS,    Pulmonary exam normal        Cardiovascular +CHF  Normal cardiovascular exam+ dysrhythmias Atrial Fibrillation + Valvular Problems/Murmurs AS    '23 Cath -   Mid RCA lesion is 50% stenosed.   Mid LAD lesion is 40% stenosed.  '23 TTE - The aortic valve is incompletely visualized on SAX view, however, suspect severe (vs. Moderate-to-severe) paradoxical low flow, low gradient  aortic stenosis with AVA 0.8cm2 by continuity, mean gradient 23mHg, Vmax 3.019m, DI 0.23, SVi 22. Notably the LVOT VTI may be underestimated due to suboptimal doppler angle. EF is 55 to 60%. Mild asymmetric left ventricular hypertrophy of the basal-septal segment. Grade II diastolic dysfunction (pseudonormalization). Right ventricular systolic function is mildly reduced. There is mildly elevated pulmonary artery systolic pressure. Left atrial size was mildly dilated. Mild mitral valve regurgitation. Mild AI, severe AS.Moderate dilatation of the ascending aorta, measuring 44 mm.    Neuro/Psych negative neurological ROS  negative psych ROS   GI/Hepatic negative GI ROS, Neg liver ROS,   Endo/Other  Hypothyroidism  Obesity   Renal/GU negative Renal ROS     Musculoskeletal  (+) Arthritis ,   Abdominal   Peds  Hematology  On eliquis    Anesthesia Other Findings   Reproductive/Obstetrics                            Anesthesia Physical Anesthesia Plan  ASA: 4  Anesthesia Plan: MAC   Post-op Pain Management:     Induction:   PONV Risk Score and Plan: 1 and Propofol infusion and Treatment may vary due to age or medical condition  Airway Management Planned: Natural Airway and Simple Face Mask  Additional Equipment: Arterial line  Intra-op Plan:   Post-operative Plan:   Informed Consent: I have reviewed the patients History and Physical, chart, labs and discussed the procedure including the risks, benefits and alternatives for the proposed anesthesia with the patient or authorized representative who has indicated his/her understanding and acceptance.       Plan Discussed with: CRNA and Anesthesiologist  Anesthesia Plan Comments:        Anesthesia Quick Evaluation

## 2021-11-14 NOTE — H&P (Signed)
[name]       [place] [address]             [phone]      Cardiothoracic Surgery Admission History and Physical   PCP is Pcp, No Referring Provider is Sherren Mocha, MD Primary Cardiologist is Buford Dresser, MD   Reason for admission:  Severe aortic stenosis   HPI:   The patient is an active 88 year old gentleman with a history of paroxysmal atrial fibrillation on Eliquis, and chronic diastolic congestive heart failure who has a long history of heart murmur.  He had an echocardiogram and 2015 at [facility] showing moderate aortic sclerosis and moderate mitral regurgitation with ejection fraction of 65 to 70%.  He has remained active exercising every day doing push-ups, walking, and free weights.  He was in his usual state of health until about 1 month ago when he pulled a groin muscle causing him to slow down his activity.  Since then he has noted decrease exertional tolerance with shortness of breath on exertion as well as orthopnea.  He has had occasional episodes where he had a lot of difficulty breathing and actually had a panic attack.  He presented to the emergency room by EMS on 11/01/2021 for evaluation of shortness of breath.  He was found to be in acute hypoxic respiratory failure with oxygen saturations in the 60s and had atrial fibrillation with RVR.  He was reportedly lethargic and confused.  BNP was 264.  Troponin was 23 and 25.  Chest x-ray showed cardiomegaly with mild interstitial edema.  2D echo on 11/02/2021 showed a trileaflet aortic valve with moderate calcification and thickening.  The mean gradient was 22 mmHg with a peak gradient of 33 mmHg.  Aortic valve area was measured by VTI at 0.83 cm.  Left ventricular ejection fraction was 55 to 60% with a low stroke-volume index of 22.  Dimensionless index was 0.24.  There is mild aortic insufficiency and mild mitral valve regurgitation.  He underwent cardiac catheterization showing mild to  moderate nonobstructive coronary disease in the LAD and RCA.  Intracardiac filling pressures were elevated with a mean wedge pressure of 27 and a mean PA pressure of 48.  Mean right atrial pressure was 16.  He was treated with intravenous diuresis and improved.  Since being on diuretics he feels much better.   He lives in [place] with a male friend.  He has 4 sons and multiple grandchildren and great-grandchildren.  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        Past Medical History:  Diagnosis Date   Chronic diastolic (congestive) heart failure (HCC)     Infected prosthetic knee joint, sequela     Mitral regurgitation     PAF (paroxysmal atrial fibrillation) (HCC)     Severe aortic stenosis             Past Surgical History:  Procedure Laterality Date   KNEE SURGERY       RIGHT/LEFT HEART CATH AND CORONARY ANGIOGRAPHY N/A 11/04/2021    Procedure: RIGHT/LEFT HEART CATH AND CORONARY ANGIOGRAPHY;  Surgeon: Sherren Mocha, MD;  Location: [facility];  Service: Cardiovascular;  Laterality: N/A;      History reviewed. No pertinent family history.   Social History         Socioeconomic History   Marital status: Unknown      Spouse name: Not on file  Number of children: Not on file   Years of education: Not on file   Highest education level: Not on file  Occupational History   Not on file  Tobacco Use   Smoking status: Never   Smokeless tobacco: Not on file  Substance and Sexual Activity   Alcohol use: Yes   Drug use: No   Sexual activity: Not on file  Other Topics Concern   Not on file  Social History Narrative   Not on file    Social Determinants of Health    Financial Resource Strain: Not on file  Food Insecurity: Not on file  Transportation Needs: Not on file  Physical Activity: Not on file  Stress: Not on file  Social Connections: Not on file  Intimate Partner Violence: Not on file             Prior to  Admission medications   Medication Sig Start Date End Date Taking? Authorizing Provider  apixaban (ELIQUIS) 5 MG TABS tablet Take 1 tablet (5 mg total) by mouth 2 (two) times daily. 11/07/21   Yes Zannie Cove, MD  B Complex Vitamins (B COMPLEX PO) Take 1 tablet by mouth daily.     Yes [provider]  bisacodyl (DULCOLAX) 5 MG EC tablet Take 10 mg by mouth daily.     Yes [provider]  Cholecalciferol (D3 PO) Take 1 capsule by mouth daily.     Yes [provider]  Cyanocobalamin (B-12 PO) Take 1 tablet by mouth daily.     Yes [provider]  furosemide (LASIX) 40 MG tablet Take 1 tablet (40 mg total) by mouth daily. 11/07/21   Yes Zannie Cove, MD  metoprolol tartrate (LOPRESSOR) 25 MG tablet Take 1/2 tablet (12.5 mg total) by mouth 2 (two) times daily. 11/07/21   Yes Zannie Cove, MD  Multiple Vitamin (MULTIVITAMIN WITH MINERALS) TABS tablet Take 1 tablet by mouth daily.     Yes [provider]  potassium chloride SA (KLOR-CON M) 20 MEQ tablet Take 1 tablet (20 mEq total) by mouth daily. 11/07/21   Yes Zannie Cove, MD            Current Outpatient Medications  Medication Sig Dispense Refill   apixaban (ELIQUIS) 5 MG TABS tablet Take 1 tablet (5 mg total) by mouth 2 (two) times daily. 60 tablet 0   B Complex Vitamins (B COMPLEX PO) Take 1 tablet by mouth daily.       bisacodyl (DULCOLAX) 5 MG EC tablet Take 10 mg by mouth daily.       Cholecalciferol (D3 PO) Take 1 capsule by mouth daily.       Cyanocobalamin (B-12 PO) Take 1 tablet by mouth daily.       furosemide (LASIX) 40 MG tablet Take 1 tablet (40 mg total) by mouth daily. 30 tablet 0   metoprolol tartrate (LOPRESSOR) 25 MG tablet Take 1/2 tablet (12.5 mg total) by mouth 2 (two) times daily. 60 tablet 0   Multiple Vitamin (MULTIVITAMIN WITH MINERALS) TABS tablet Take 1 tablet by mouth daily.       potassium chloride SA (KLOR-CON M) 20 MEQ tablet Take 1 tablet (20 mEq total) by mouth  daily. 30 tablet 0    No current facility-administered medications for this visit.      No Known Allergies       Review of Systems:               General:  normal appetite, + decreased energy, no weight gain, no weight loss, no fever             Cardiac:                       no chest pain with exertion, no chest pain at rest, +SOB with mild exertion, no resting SOB, no PND, + orthopnea, no palpitations, + arrhythmia, + atrial fibrillation, no LE edema, no dizzy spells, no syncope             Respiratory:                 + exertional shortness of breath, no home oxygen, no productive cough, no dry cough, no bronchitis, no wheezing, no hemoptysis, no asthma, no pain with inspiration or cough, no sleep apnea, no CPAP at night             GI:                               no difficulty swallowing, no reflux, no frequent heartburn, no hiatal hernia, no abdominal pain, no constipation, no diarrhea, no hematochezia, no hematemesis, no melena             GU:                              no dysuria,  no frequency, no urinary tract infection, no hematuria, no enlarged prostate, no kidney stones, no kidney disease             Vascular:                     no pain suggestive of claudication, no pain in feet, no leg cramps, no varicose veins, no DVT, no non-healing foot ulcer             Neuro:                         no stroke, no TIA's, no seizures, no headaches, no temporary blindness one eye,  no slurred speech, no peripheral neuropathy, no chronic pain, no instability of gait, no memory/cognitive dysfunction             Musculoskeletal:         + arthritis - primarily involving the knees, no joint swelling, no myalgias, no difficulty walking, normal mobility              Skin:                            no rash, no itching, no skin infections, no pressure sores or ulcerations             Psych:                         no anxiety, no depression, no nervousness, no unusual recent  stress             Eyes:                           no blurry vision, no floaters, no recent vision changes, + wears glasses or contacts  ENT:                            no hearing loss, no loose or painful teeth, no dentures, last saw dentist 2022             Hematologic:               no easy bruising, no abnormal bleeding, no clotting disorder, no frequent epistaxis             Endocrine:                   no diabetes, does not check CBG's at home                            Physical Exam:               BP 118/79    Pulse 76    Resp 20    Ht 6\' 5"  (1.956 m)    Wt 273 lb (123.8 kg)    SpO2 93% Comment: RA   BMI 32.37 kg/m              General:                      Large frame,  well-appearing             HEENT:                       Unremarkable, NCAT, PERLA, EOMI             Neck:                           no JVD, no bruits, no adenopathy              Chest:                          clear to auscultation, symmetrical breath sounds, no wheezes, no rhonchi              CV:                              RRR,  3/6 systolic murmur RSB, no diastolic murmur             Abdomen:                    soft, non-tender, no masses              Extremities:                 warm, well-perfused, pulses palpable at ankle, no lower extremity edema             Rectal/GU                   Deferred             Neuro:                         Grossly non-focal and symmetrical throughout             Skin:  Clean and dry, no rashes, no breakdown   Diagnostic Tests:      ECHOCARDIOGRAM REPORT         Patient Name:   Louis Koch Date of Exam: 11/02/2021  Medical Rec #:  [id number]    Height:       77.0 in  Accession #:    [id number]   Weight:       296.7 lb  Date of Birth:  17-Feb-1927    BSA:          2.645 m  Patient Age:    21 years     BP:           134/90 mmHg  Patient Gender: M            HR:           74 bpm.  Exam Location:  Inpatient   Procedure: 2D Echo, Cardiac  Doppler and Color Doppler   Indications:    CHF-Acute Diastolic [id number]     History:        Patient has no prior history of Echocardiogram  examinations.     Sonographer:    Bernadene Person RDCS  Referring Phys: [id number] [place]     1. The aortic valve is incompletely visualized on SAX view, however,  suspect severe (vs moderate-to-severe) paradoxical low flow, low gradient  aortic stenosis with AVA 0.8cm2 by continuity, mean gradient 25mmHg, Vmax  3.70m/s, DI 0.23, SVi 22. Notably the   LVOT VTI may be underestimated due to suboptimal doppler angle.   2. Left ventricular ejection fraction, by estimation, is 55 to 60%. The  left ventricle has normal function. The left ventricle has no regional  wall motion abnormalities. There is mild asymmetric left ventricular  hypertrophy of the basal-septal segment.  Left ventricular diastolic parameters are consistent with Grade II  diastolic dysfunction (pseudonormalization).   3. Right ventricular systolic function is mildly reduced. The right  ventricular size is normal. There is mildly elevated pulmonary artery  systolic pressure.   4. Left atrial size was mildly dilated.   5. The mitral valve is degenerative. Mild mitral valve regurgitation.  Moderate mitral annular calcification.   6. The aortic valve is tricuspid. There is moderate calcification of the  aortic valve. There is moderate thickening of the aortic valve. Aortic  valve regurgitation is mild. Severe aortic valve stenosis.   7. Aortic dilatation noted. There is moderate dilatation of the ascending  aorta, measuring 44 mm.   8. The inferior vena cava is dilated in size with <50% respiratory  variability, suggesting right atrial pressure of 15 mmHg.   Comparison(s): No prior Echocardiogram.   FINDINGS   Left Ventricle: Left ventricular ejection fraction, by estimation, is 55  to 60%. The left ventricle has normal function. The left ventricle has no   regional wall motion abnormalities. The left ventricular internal cavity  size was normal in size. There is   mild asymmetric left ventricular hypertrophy of the basal-septal segment.  Left ventricular diastolic parameters are consistent with Grade II  diastolic dysfunction (pseudonormalization).   Right Ventricle: The right ventricular size is normal. Right vetricular  wall thickness was not well visualized. Right ventricular systolic  function is mildly reduced. There is mildly elevated pulmonary artery  systolic pressure. The tricuspid regurgitant  velocity is 2.69 m/s, and with an assumed right atrial pressure of 15  mmHg, the estimated right ventricular systolic pressure is Q000111Q mmHg.   Left  Atrium: Left atrial size was mildly dilated.   Right Atrium: Right atrial size was normal in size.   Pericardium: There is no evidence of pericardial effusion.   Mitral Valve: The mitral valve is degenerative in appearance. There is  mild thickening of the mitral valve leaflet(s). There is mild  calcification of the mitral valve leaflet(s). Moderate mitral annular  calcification. Mild mitral valve regurgitation.   Tricuspid Valve: The tricuspid valve is normal in structure. Tricuspid  valve regurgitation is mild.   Aortic Valve: The aortic valve is tricuspid. There is moderate  calcification of the aortic valve. There is moderate thickening of the  aortic valve. Aortic valve regurgitation is mild. Aortic regurgitation PHT  measures 500 msec. Severe aortic stenosis is  present. Aortic valve mean gradient measures 22.0 mmHg. Aortic valve peak  gradient measures 32.8 mmHg. Aortic valve area, by VTI measures 0.83 cm.   Pulmonic Valve: The pulmonic valve was not well visualized. Pulmonic valve  regurgitation is trivial.   Aorta: The aortic root is normal in size and structure and aortic  dilatation noted. There is moderate dilatation of the ascending aorta,  measuring 44 mm.   Venous:  The inferior vena cava is dilated in size with less than 50%  respiratory variability, suggesting right atrial pressure of 15 mmHg.   IAS/Shunts: The atrial septum is grossly normal.      LEFT VENTRICLE  PLAX 2D  LVIDd:         4.70 cm      Diastology  LVIDs:         3.10 cm      LV e' medial:    4.60 cm/s  LV PW:         1.00 cm      LV E/e' medial:  33.0  LV IVS:        0.90 cm      LV e' lateral:   6.05 cm/s  LVOT diam:     2.10 cm      LV E/e' lateral: 25.1  LV SV:         58  LV SV Index:   22  LVOT Area:     3.46 cm     LV Volumes (MOD)  LV vol d, MOD A2C: 68.4 ml  LV vol d, MOD A4C: 127.0 ml  LV vol s, MOD A2C: 29.4 ml  LV vol s, MOD A4C: 55.2 ml  LV SV MOD A2C:     39.0 ml  LV SV MOD A4C:     127.0 ml  LV SV MOD BP:      53.0 ml   RIGHT VENTRICLE  RV S prime:     7.76 cm/s  TAPSE (M-mode): 1.7 cm   LEFT ATRIUM             Index        RIGHT ATRIUM           Index  LA diam:        4.40 cm 1.66 cm/m   RA Area:     17.70 cm  LA Vol (A2C):   87.4 ml 33.04 ml/m  RA Volume:   45.00 ml  17.01 ml/m  LA Vol (A4C):   95.4 ml 36.07 ml/m  LA Biplane Vol: 93.0 ml 35.16 ml/m   AORTIC VALVE  AV Area (Vmax):    0.93 cm  AV Area (Vmean):   0.83 cm  AV Area (VTI):  0.83 cm  AV Vmax:           286.33 cm/s  AV Vmean:          222.667 cm/s  AV VTI:            0.702 m  AV Peak Grad:      32.8 mmHg  AV Mean Grad:      22.0 mmHg  LVOT Vmax:         77.17 cm/s  LVOT Vmean:        53.333 cm/s  LVOT VTI:          0.168 m  LVOT/AV VTI ratio: 0.24  AI PHT:            500 msec     AORTA  Ao Root diam: 3.50 cm  Ao Asc diam:  4.40 cm   MITRAL VALVE                TRICUSPID VALVE  MV Area (PHT): 3.77 cm     TR Peak grad:   28.9 mmHg  MV Decel Time: 201 msec     TR Vmax:        269.00 cm/s  MR Peak grad: 112.8 mmHg  MR Mean grad: 73.0 mmHg     SHUNTS  MR Vmax:      531.00 cm/s   Systemic VTI:  0.17 m  MR Vmean:     411.0 cm/s    Systemic Diam: 2.10 cm  MV E velocity:  152.00 cm/s  MV A velocity: 85.00 cm/s  MV E/A ratio:  1.79   Gwyndolyn Kaufman MD  Electronically signed by Gwyndolyn Kaufman MD  Signature Date/Time: 11/02/2021/4:50:05 PM         Final          Physicians   Panel Physicians Referring Physician Case Authorizing Physician  Sherren Mocha, MD (Primary)        Procedures   RIGHT/LEFT HEART CATH AND CORONARY ANGIOGRAPHY    Conclusion       Mid RCA lesion is 50% stenosed.   Mid LAD lesion is 40% stenosed.   1.  Mild to moderate nonobstructive coronary artery disease with moderate ostial RCA stenosis, mild to moderate mid RCA stenosis, and an otherwise patent right coronary artery. 2.  Patent left main with mild plaquing 3.  Patent LAD with mild mid vessel stenosis of 40% 4.  Patent left circumflex with mild luminal irregularity. 5.  Calcified, restricted aortic valve with peak to peak gradient 24 mmHg 6.  Elevated intracardiac filling pressures with mean wedge pressure of 27, mean PA pressure of 48, and right atrial mean pressure of 16.   Recommend: Continued TAVR evaluation, continued IV diuresis for treatment of heart failure, okay to start apixaban tomorrow morning.   Indications   Severe aortic stenosis [I35.0 (ICD-10-CM)]    Procedural Details   Technical Details INDICATION: 88 year old gentleman admitted with congestive heart failure, found to have aortic stenosis, with noninvasive assessment suspicious for severe low-flow low gradient aortic stenosis.  The patient is referred for cardiac catheterization for further evaluation.  PROCEDURAL DETAILS: There was an indwelling IV in a right antecubital vein. Using normal sterile technique, the IV was changed out for a 5 Fr brachial sheath over a 0.018 inch wire. The right wrist was then prepped, draped, and anesthetized with 1% lidocaine. Using the modified Seldinger technique a 5/6 French Slender sheath was placed in the right radial artery. Intra-arterial verapamil was  administered  through the radial artery sheath.  There is a right radial loop present.  I try to traverse this with a versa core wire and a Glidewire.  This was unsuccessful.  Radial access was then abandoned and attention was turned to the femoral area.  Using ultrasound guidance and a micropuncture technique, the right femoral artery is accessed via a front wall puncture.  A 5 French sheath is inserted.  Standard Judkins catheters are used for the cardiac catheterization procedure.  The aortic valve is crossed with an AL 2 catheter and exchange length straight wire and then changed out for a pigtail catheter.  A Swan-Ganz catheter was used for the right heart catheterization. Standard protocol was followed for recording of right heart pressures and sampling of oxygen saturations. Fick cardiac output was calculated. Aortic valve pullback is performed. There were no immediate procedural complications. Mynx closure is used for femoral hemostasis. A TR band is used for radial hemostasis. The patient was transferred to the post catheterization recovery area for further monitoring.      Estimated blood loss <50 mL.   During this procedure medications were administered to achieve and maintain moderate conscious sedation while the patient's heart rate, blood pressure, and oxygen saturation were continuously monitored and I was present face-to-face 100% of this time.    Medications (Filter: Administrations occurring from 1332 to 1525 on 11/04/21)  important  Continuous medications are totaled by the amount administered until 11/04/21 1525.    Heparin (Porcine) in NaCl 1000-0.9 UT/500ML-% SOLN (mL) Total volume:  1,000 mL Date/Time Rate/Dose/Volume Action    11/04/21 1337 500 mL Given    1337 500 mL Given      0.9 %  sodium chloride infusion (mL/hr) Total dose:  Cannot be calculated* *Continuous medication not stopped within the calculation time range. Date/Time Rate/Dose/Volume Action    11/04/21 1345  10 mL/hr New Bag/Given      fentaNYL (SUBLIMAZE) injection (mcg) Total dose:  25 mcg Date/Time Rate/Dose/Volume Action    11/04/21 1401 25 mcg Given      midazolam (VERSED) injection (mg) Total dose:  1 mg Date/Time Rate/Dose/Volume Action    11/04/21 1401 1 mg Given      lidocaine (PF) (XYLOCAINE) 1 % injection (mL) Total volume:  20 mL Date/Time Rate/Dose/Volume Action    11/04/21 1403 2 mL Given    1404 3 mL Given    1432 15 mL Given      iohexol (OMNIPAQUE) 350 MG/ML injection (mL) Total volume:  60 mL Date/Time Rate/Dose/Volume Action    11/04/21 1500 60 mL Given      Radial Cocktail/Verapamil only (mL) Total volume:  10 mL Date/Time Rate/Dose/Volume Action    11/04/21 1406 10 mL Given      0.9 %  sodium chloride infusion (mL) Total dose:  Cannot be calculated* Dosing weight:  129.3 *Administration dose not documented Date/Time Rate/Dose/Volume Action    11/04/21 1332 *Not included in total MAR Hold    1523 *Not included in total MAR Unhold      acetaminophen (TYLENOL) tablet 650 mg (mg) Total dose:  Cannot be calculated* Dosing weight:  129.3 *Administration dose not documented Date/Time Rate/Dose/Volume Action    11/04/21 1332 *Not included in total MAR Hold    1523 *Not included in total MAR Unhold      guaiFENesin (MUCINEX) 12 hr tablet 1,200 mg (mg) Total dose:  Cannot be calculated* Dosing weight:  129.3 *Administration dose not documented Date/Time Rate/Dose/Volume Action  11/04/21 1332 *Not included in total MAR Hold    1523 *Not included in total MAR Unhold      ipratropium-albuterol (DUONEB) 0.5-2.5 (3) MG/3ML nebulizer solution 3 mL (mL) Total dose:  Cannot be calculated* Dosing weight:  131 *Administration dose not documented Date/Time Rate/Dose/Volume Action    11/04/21 1332 *Not included in total MAR Hold    1523 *Not included in total MAR Unhold      metoprolol tartrate (LOPRESSOR) tablet 12.5 mg (mg) Total dose:  Cannot be calculated*  Dosing weight:  129.3 *Administration dose not documented Date/Time Rate/Dose/Volume Action    11/04/21 1332 *Not included in total MAR Hold    1523 *Not included in total MAR Unhold      ondansetron (ZOFRAN) injection 4 mg (mg) Total dose:  Cannot be calculated* Dosing weight:  129.3 *Administration dose not documented Date/Time Rate/Dose/Volume Action    11/04/21 1332 *Not included in total MAR Hold    1523 *Not included in total MAR Unhold      polyethylene glycol (MIRALAX / GLYCOLAX) packet 17 g (g) Total dose:  Cannot be calculated* Dosing weight:  134.6 *Administration dose not documented Date/Time Rate/Dose/Volume Action    11/04/21 1332 *Not included in total MAR Hold    1523 *Not included in total MAR Unhold      sodium chloride flush (NS) 0.9 % injection 3 mL (mL) Total dose:  Cannot be calculated* Dosing weight:  129.3 *Administration dose not documented Date/Time Rate/Dose/Volume Action    11/04/21 1332 *Not included in total MAR Hold    1523 *Not included in total MAR Unhold      sodium chloride flush (NS) 0.9 % injection 3 mL (mL) Total dose:  Cannot be calculated* Dosing weight:  129.3 *Administration dose not documented Date/Time Rate/Dose/Volume Action    11/04/21 1332 *Not included in total MAR Hold    1523 *Not included in total MAR Unhold      sodium chloride flush (NS) 0.9 % injection 3 mL (mL) Total dose:  Cannot be calculated* Dosing weight:  132.9 *Administration dose not documented Date/Time Rate/Dose/Volume Action    11/04/21 1332 *Not included in total MAR Hold    1523 *Not included in total MAR Unhold      Sedation Time   Sedation Time Physician-1: 43 minutes 48 seconds Radiation/Fluoro   Fluoro time: 13.8 (min) DAP: 32.4 (Gycm2) Cumulative Air Kerma: Q000111Q (mGy) Complications      Complications documented before study signed (11/04/2021  3:34 PM)     RIGHT/LEFT HEART CATH AND CORONARY ANGIOGRAPHY   None Documented by Karma Greaser, RT  11/04/2021  3:29 PM  Date Found: 11/04/2021  Time Range: Intraprocedure          Coronary Findings   Diagnostic Dominance: Right Left Main  There is mild diffuse disease throughout the vessel.    Left Anterior Descending  There is mild diffuse disease throughout the vessel.  Mid LAD lesion is 40% stenosed.    Left Circumflex  There is mild diffuse disease throughout the vessel.    Right Coronary Artery  Vessel is large. There is mild diffuse disease throughout the vessel. Large, dominant vessel. 40 to 50% ostial stenosis is noted. No significant pressure dampening is noted with the catheter into the vessel. Otherwise there is diffuse plaquing throughout the RCA.  Mid RCA lesion is 50% stenosed. The lesion is moderately calcified.    Intervention    No interventions have been documented.    Left Heart  Aortic Valve The aortic valve is calcified. There is restricted aortic valve motion.    Coronary Diagrams   Diagnostic Dominance: Right Intervention   Implants         Vascular Products   Closure Mynx Control 59f [id number] - Implanted Inventory item: CLOSURE [facility] CONTROL 37F Model/Cat number: [id number]  Manufacturer: [employer] Lot number: [id number]  Device identifier: [id number] Device identifier type: GS1  Area Of Implantation: Femoral Artery      GUDID Information   Request status Successful      Brand name: MYNX CONTROL Version/Model: [id number]  Company name: [employer] safety info as of 11/04/21: MR Safe  Contains dry or latex rubber: No      GMDN P.T. name: Wound hydrogel dressing, non-antimicrobial        As of 11/04/2021   Status: Implanted          Syngo Images    Show images for CARDIAC CATHETERIZATION Images on Long Term Storage    Show images for [place], [place] to Procedure Log   Procedure Log    Hemo Data   Flowsheet Row Most Recent Value  Fick Cardiac Output 5.93 L/min  Fick Cardiac Output Index 2.25 (L/min)/BSA   Aortic Mean Gradient 25.88 mmHg  Aortic Peak Gradient 21 mmHg  Aortic Valve Area 1.88  Aortic Value Area Index 0.72 cm2/BSA  RA A Wave 15 mmHg  RA V Wave 18 mmHg  RA Mean 16 mmHg  RV Systolic Pressure 65 mmHg  RV Diastolic Pressure 14 mmHg  RV EDP 19 mmHg  PA Systolic Pressure 65 mmHg  PA Diastolic Pressure 35 mmHg  PA Mean 48 mmHg  PW A Wave 28 mmHg  PW V Wave 21 mmHg  PW Mean 27 mmHg  AO Systolic Pressure 123XX123 mmHg  AO Diastolic Pressure 90 mmHg  AO Mean 99991111 mmHg  LV Systolic Pressure XX123456 mmHg  LV Diastolic Pressure 14 mmHg  LV EDP 21 mmHg  AOp Systolic Pressure A999333 mmHg  AOp Diastolic Pressure 82 mmHg  AOp Mean Pressure XX123456 mmHg  LVp Systolic Pressure 123XX123 mmHg  LVp Diastolic Pressure 11 mmHg  LVp EDP Pressure 24 mmHg  Arterial Extended Systolic Pressure 0000000 mmHg  Arterial Extended Diastolic Pressure 81 mmHg  Arterial Extended Mean Pressure 104 mmHg  QP/QS 1  TPVR Index 21.3 HRUI  TSVR Index 47.92 HRUI  PVR SVR Ratio 0.23  TPVR/TSVR Ratio 0.44      Addendum   ADDENDUM REPORT: 11/07/2021 08:32   ADDENDUM: Extracardiac findings are described separately under dictation for contemporaneously obtained CTA chest, abdomen and pelvis dated 11/05/2021. Please see that dictation for full description of relevant extracardiac findings.     Electronically Signed   By: Vinnie Langton M.D.   On: 11/07/2021 08:32    Addended by Etheleen Mayhew, MD on 11/07/2021  8:34 AM    Study Result   Narrative & Impression  CLINICAL DATA:  Severe Aortic Stenosis.   EXAM: Cardiac TAVR CT   TECHNIQUE: A non-contrast, gated CT scan was obtained with axial slices of 3 mm through the heart for aortic valve calcium scoring. A 120 kV retrospective, gated, contrast cardiac scan was obtained. Gantry rotation speed was 250 msecs and collimation was 0.6 mm. Nitroglycerin was not given. The 3D data set was reconstructed in 5% intervals of the 0-95% of the R-R cycle. Systolic and  diastolic phases were analyzed on a dedicated workstation using MPR, MIP, and VRT  modes. The patient received 100 cc of contrast.   FINDINGS: Image quality: Excellent.   Noise artifact is: Limited.   Valve Morphology: The aortic valve sis tricuspid. The leaflets are severely calcified with bulky calcification of the Shepherd that extends to the annulus. There is acquired fusion of the NCC/LCC.   Aortic Valve Calcium score: 2247   Aortic annular dimension:   Phase assessed: 35%   Annular area: 511 mm2   Annular perimeter: 81.2 mm   Max diameter: 27.8 mm   Min diameter: 23.5 mm   Annular and subannular calcification: A single, moderate, protruding calcification is noted under the [place].   Membranous septum length: 13.1 mm. Poor quality study for this measurement.   Optimal coplanar projection: LAO 5 CAU 0   Coronary Artery Height above Annulus:   Left Main: 15.9 mm   Right Coronary: 21.7 mm   Sinus of Valsalva Measurements:   Non-coronary: 37.5 mm   Right-coronary: 34.4 mm   Left-coronary: 35.0 mm   Sinus of Valsalva Height:   Non-coronary: 25.7 mm   Right-coronary: 26.5 mm   Left-coronary: 23.4 mm   Sinotubular Junction: 35 mm   Ascending Thoracic Aorta: 42 mm   Coronary Arteries: Normal coronary origin. Right dominance. The study was performed without use of NTG and is insufficient for plaque evaluation. Please refer to recent cardiac catheterization for coronary assessment. 3-vessel coronary calcifications.   Cardiac Morphology:   Right Atrium: Right atrial size is within normal limits.   Right Ventricle: The right ventricular cavity is dilated.   Left Atrium: Left atrial size is normal in size. There is mixing artifact in the distal LAA and cannot exclude thrombus.   Left Ventricle: The ventricular cavity size is within normal limits. There are no stigmata of prior infarction. There is no abnormal filling defect. Mildly reduced left ventricular  function, LVEF=40%. Global hypokinesis.   Pulmonary arteries: Normal in size without proximal filling defect.   Pulmonary veins: Normal pulmonary venous drainage.   Pericardium: Normal thickness with no significant effusion or calcium present.   Mitral Valve: The mitral valve is normal structure without significant calcification.   Extra-cardiac findings: Small right pleural effusion. See attached radiology report for non-cardiac structures.   IMPRESSION: 1. Severely calcified tricuspid aortic valve with bulky calcification of the Braxton.   2. Annular measurements appropriate for 26 mm S3 (511 m2).   3. A single, moderate, protruding calcification is noted under the [place].   4. Sufficient coronary to annulus distance.   5. Optimal Fluoroscopic Angle for Delivery: LAO 5 CAU 0   6. Mildly reduced left ventricular function, LVEF=40%. Global hypokinesis.   7. Ascending aortic aneurysm up to 42 mm.   8. There is mixing artifact in the distal LAA and cannot exclude thrombus.   [place] Bells T. Audie Box, MD   Electronically Signed: By: Eleonore Chiquito M.D. On: 11/06/2021 16:58          Narrative & Impression  CLINICAL DATA:  88 year old male with history of severe aortic stenosis. Preprocedural study prior to potential transcatheter aortic valve replacement (TAVR) procedure.   EXAM: CT ANGIOGRAPHY CHEST, ABDOMEN AND PELVIS   TECHNIQUE: Multidetector CT imaging through the chest, abdomen and pelvis was performed using the standard protocol during bolus administration of intravenous contrast. Multiplanar reconstructed images and MIPs were obtained and reviewed to evaluate the vascular anatomy.   CONTRAST:  172mL OMNIPAQUE IOHEXOL 350 MG/ML SOLN   COMPARISON:  No priors.   FINDINGS: CTA CHEST FINDINGS  Cardiovascular: Heart size is mildly enlarged with left atrial dilatation, but normal left ventricular size. There is no significant pericardial fluid, thickening or  pericardial calcification. There is aortic atherosclerosis, as well as atherosclerosis of the great vessels of the mediastinum and the coronary arteries, including calcified atherosclerotic plaque in the left main, left anterior descending, left circumflex and right coronary arteries. Severe thickening and calcification of the aortic valve. Mild calcifications of the mitral annulus.   Mediastinum/Lymph Nodes: No pathologically enlarged mediastinal or hilar lymph nodes. Esophagus is unremarkable in appearance. No axillary lymphadenopathy.   Lungs/Pleura: Small right and trace left pleural effusions lying dependently. Patchy areas of peribronchovascular predominant ground-glass attenuation are noted in the central aspects of the lungs bilaterally, most severe throughout the mid to upper lungs. No definite suspicious appearing pulmonary nodules or masses are noted.   Musculoskeletal/Soft Tissues: There are no aggressive appearing lytic or blastic lesions noted in the visualized portions of the skeleton.   CTA ABDOMEN AND PELVIS FINDINGS   Hepatobiliary: No suspicious cystic or solid hepatic lesions. No intra or extrahepatic biliary ductal dilatation. Gallbladder is normal in appearance.   Pancreas: No pancreatic mass. No pancreatic ductal dilatation. No pancreatic or peripancreatic fluid collections or inflammatory changes.   Spleen: Unremarkable.   Adrenals/Urinary Tract: Generalized cortical atrophy in the kidneys bilaterally. No suspicious renal lesions. No hydroureteronephrosis. Bilateral adrenal glands are normal in appearance. 1.9 cm diverticulum in the right-side of the urinary bladder incidentally noted. Urinary bladder is otherwise unremarkable in appearance.   Stomach/Bowel: The appearance of the stomach is normal. There is no pathologic dilatation of small bowel or colon. The appendix is not confidently identified and may be surgically absent. Regardless, there are  no inflammatory changes noted adjacent to the cecum to suggest the presence of an acute appendicitis at this time.   Vascular/Lymphatic: Vascular findings and measurements pertinent to potential TAVR procedure, as detailed below. No aneurysm or dissection noted in the abdominal or pelvic vasculature. Intermediate attenuation fluid surrounding the right superficial femoral and common femoral arteries, likely residual blood products related to recent vascular access. No lymphadenopathy noted in the abdomen or pelvis.   Reproductive: Prostate gland and seminal vesicles are unremarkable in appearance.   Other: No significant volume of ascites.  No pneumoperitoneum.   Musculoskeletal: There are no aggressive appearing lytic or blastic lesions noted in the visualized portions of the skeleton.   VASCULAR MEASUREMENTS PERTINENT TO TAVR:   AORTA:   Minimal Aortic Diameter-19 x 17 mm   Severity of Aortic Calcification-mild   RIGHT PELVIS:   Right Common Iliac Artery -   Minimal Diameter-12.0 x 12.3 mm   Tortuosity-mild   Calcification-minimal   Right External Iliac Artery -   Minimal Diameter-10.3 x 9.5 mm   Tortuosity-severe   Calcification-none   Right Common Femoral Artery -   Minimal Diameter-10.4 x 10.7 mm   Tortuosity-mild   Calcification-minimal   LEFT PELVIS:   Left Common Iliac Artery -   Minimal Diameter-14.1 x 12.7 mm   Tortuosity-moderate   Calcification-mild   Left External Iliac Artery -   Minimal Diameter-9.7 x 9.7 mm   Tortuosity-severe   Calcification-none   Left Common Femoral Artery -   Minimal Diameter-10.6 x 10.1 mm   Tortuosity-mild   Calcification-minimal   Review of the MIP images confirms the above findings.   IMPRESSION: 1. Vascular findings and measurements pertinent to potential TAVR procedure, as detailed above. 2. Severe thickening calcification of the aortic valve, compatible with  reported clinical history of  severe aortic stenosis. 3. The appearance of the lungs could reflect pulmonary edema. Given the presence of small bilateral pleural effusions and normal left ventricular size, diastolic congestive heart failure should be considered. Alternatively, if there is history of fever and sputum production, multilobar bilateral bronchopneumonia should be considered. 4. Aortic atherosclerosis, in addition to left main and three-vessel coronary artery disease. 5. Additional incidental findings, as above.     Electronically Signed   By: Vinnie Langton M.D.   On: 11/07/2021 08:47      STS Risk Calculator: Procedure: AV Replacement  Risk of Mortality: 4.013% Renal Failure: 4.002% Permanent Stroke: 2.124% Prolonged Ventilation: 12.162% DSW Infection: 0.137% Reoperation: 4.221% Morbidity or Mortality: 19.886% Short Length of Stay: 14.098% Long Length of Stay: 11.598%     Impression:   This 88 year old active gentleman has stage D 2 severe, symptomatic, low-flow/low gradient aortic stenosis with New York Heart Association class IIl symptoms of exertional fatigue and shortness of breath consistent with chronic diastolic congestive heart failure.  He was recently admitted with congestive heart failure and acute pulmonary edema with acute hypoxic respiratory failure.  I have personally reviewed his 2D echocardiogram, cardiac catheterization, and CTA studies.  His echocardiogram shows a trileaflet aortic valve with moderate thickening and calcification with restricted leaflet mobility.  His mean gradient was only 22 mmHg but his valve looks severely stenotic.  His stroke-volume index was low at 22.  Cardiac catheterization shows mild to moderate nonobstructive coronary disease with elevated filling pressures consistent with diastolic congestive heart failure.  I agree that aortic valve replacement is indicated in this patient for relief of his symptoms and to prevent progressive left  ventricular deterioration.  Given his advanced age I think that transcatheter aortic valve replacement would be the best treatment for him.  His gated cardiac CTA shows anatomy suitable for TAVR using a SAPIEN 3 valve.  His abdominal and pelvic CTA shows adequate pelvic vascular anatomy to allow transfemoral insertion.   The patient and his son were counseled at length regarding treatment alternatives for management of severe symptomatic aortic stenosis. The risks and benefits of surgical intervention has been discussed in detail. Long-term prognosis with medical therapy was discussed. Alternative approaches such as conventional surgical aortic valve replacement, transcatheter aortic valve replacement, and palliative medical therapy were compared and contrasted at length. This discussion was placed in the context of the patient's own specific clinical presentation and past medical history. All of their questions have been addressed.    Following the decision to proceed with transcatheter aortic valve replacement, a discussion was held regarding what types of management strategies would be attempted intraoperatively in the event of life-threatening complications, including whether or not the patient would be considered a candidate for the use of cardiopulmonary bypass and/or conversion to open sternotomy for attempted surgical intervention.  At his advanced age I do not think he is a candidate for emergent sternotomy to manage any intraoperative complications.  The patient is aware of the fact that transient use of cardiopulmonary bypass may be necessary. The patient has been advised of a variety of complications that might develop including but not limited to risks of death, stroke, paravalvular leak, aortic dissection or other major vascular complications, aortic annulus rupture, device embolization, cardiac rupture or perforation, mitral regurgitation, acute myocardial infarction, arrhythmia, heart block or  bradycardia requiring permanent pacemaker placement, congestive heart failure, respiratory failure, renal failure, pneumonia, infection, other late complications related to structural valve deterioration or  migration, or other complications that might ultimately cause a temporary or permanent loss of functional independence or other long term morbidity. The patient provides full informed consent for the procedure as described and all questions were answered.       Plan:   Transfemoral TAVR using a SAPIEN 3 valve.     Gaye Pollack, MD

## 2021-11-14 NOTE — Progress Notes (Addendum)
TAVR RN, Julieta Gutting, notified of pt's UA results. Per MD, No new orders at this time.   Viviano Simas, RN

## 2021-11-15 ENCOUNTER — Other Ambulatory Visit: Payer: Self-pay | Admitting: Physician Assistant

## 2021-11-15 ENCOUNTER — Inpatient Hospital Stay (HOSPITAL_COMMUNITY): Payer: Medicare Other | Admitting: Physician Assistant

## 2021-11-15 ENCOUNTER — Inpatient Hospital Stay (HOSPITAL_COMMUNITY)
Admission: RE | Admit: 2021-11-15 | Discharge: 2021-11-16 | DRG: 266 | Disposition: A | Discharge status: Home / Self Care | Payer: Medicare Other | Attending: Surgery | Admitting: Surgery

## 2021-11-15 ENCOUNTER — Encounter (HOSPITAL_COMMUNITY)
Admission: RE | Disposition: A | Discharge status: Home / Self Care | Payer: Self-pay | Source: Home / Self Care | Attending: Surgery

## 2021-11-15 ENCOUNTER — Inpatient Hospital Stay (HOSPITAL_COMMUNITY): Payer: Medicare Other | Admitting: Certified Registered Nurse Anesthetist

## 2021-11-15 ENCOUNTER — Inpatient Hospital Stay (HOSPITAL_COMMUNITY): Payer: Medicare Other

## 2021-11-15 ENCOUNTER — Encounter (HOSPITAL_COMMUNITY): Payer: Self-pay | Admitting: Cardiovascular Disease

## 2021-11-15 DIAGNOSIS — Z006 Encounter for examination for normal comparison and control in clinical research program: Secondary | ICD-10-CM | POA: Diagnosis not present

## 2021-11-15 DIAGNOSIS — T8202XA Displacement of heart valve prosthesis, initial encounter: Secondary | ICD-10-CM | POA: Diagnosis not present

## 2021-11-15 DIAGNOSIS — Y712 Prosthetic and other implants, materials and accessory cardiovascular devices associated with adverse incidents: Secondary | ICD-10-CM | POA: Diagnosis not present

## 2021-11-15 DIAGNOSIS — Y838 Other surgical procedures as the cause of abnormal reaction of the patient, or of later complication, without mention of misadventure at the time of the procedure: Secondary | ICD-10-CM | POA: Diagnosis not present

## 2021-11-15 DIAGNOSIS — Z6832 Body mass index (BMI) 32.0-32.9, adult: Secondary | ICD-10-CM

## 2021-11-15 DIAGNOSIS — I4819 Other persistent atrial fibrillation: Secondary | ICD-10-CM | POA: Diagnosis present

## 2021-11-15 DIAGNOSIS — I10 Essential (primary) hypertension: Secondary | ICD-10-CM | POA: Diagnosis present

## 2021-11-15 DIAGNOSIS — I2510 Atherosclerotic heart disease of native coronary artery without angina pectoris: Secondary | ICD-10-CM | POA: Diagnosis present

## 2021-11-15 DIAGNOSIS — Z79899 Other long term (current) drug therapy: Secondary | ICD-10-CM | POA: Diagnosis not present

## 2021-11-15 DIAGNOSIS — I482 Chronic atrial fibrillation, unspecified: Secondary | ICD-10-CM | POA: Diagnosis present

## 2021-11-15 DIAGNOSIS — Z952 Presence of prosthetic heart valve: Secondary | ICD-10-CM | POA: Diagnosis not present

## 2021-11-15 DIAGNOSIS — I9751 Accidental puncture and laceration of a circulatory system organ or structure during a circulatory system procedure: Secondary | ICD-10-CM | POA: Diagnosis not present

## 2021-11-15 DIAGNOSIS — E039 Hypothyroidism, unspecified: Secondary | ICD-10-CM | POA: Diagnosis present

## 2021-11-15 DIAGNOSIS — E669 Obesity, unspecified: Secondary | ICD-10-CM | POA: Diagnosis present

## 2021-11-15 DIAGNOSIS — Z20822 Contact with and (suspected) exposure to covid-19: Secondary | ICD-10-CM | POA: Diagnosis present

## 2021-11-15 DIAGNOSIS — Z7901 Long term (current) use of anticoagulants: Secondary | ICD-10-CM | POA: Diagnosis not present

## 2021-11-15 DIAGNOSIS — M1990 Unspecified osteoarthritis, unspecified site: Secondary | ICD-10-CM | POA: Diagnosis present

## 2021-11-15 DIAGNOSIS — I350 Nonrheumatic aortic (valve) stenosis: Principal | ICD-10-CM | POA: Diagnosis present

## 2021-11-15 DIAGNOSIS — I5033 Acute on chronic diastolic (congestive) heart failure: Secondary | ICD-10-CM | POA: Diagnosis present

## 2021-11-15 DIAGNOSIS — I5031 Acute diastolic (congestive) heart failure: Secondary | ICD-10-CM | POA: Diagnosis present

## 2021-11-15 DIAGNOSIS — T82897A Other specified complication of cardiac prosthetic devices, implants and grafts, initial encounter: Secondary | ICD-10-CM

## 2021-11-15 DIAGNOSIS — Z954 Presence of other heart-valve replacement: Secondary | ICD-10-CM | POA: Diagnosis not present

## 2021-11-15 HISTORY — PX: TRANSCATHETER AORTIC VALVE REPLACEMENT, TRANSFEMORAL: SHX6400

## 2021-11-15 HISTORY — DX: Presence of prosthetic heart valve: Z95.2

## 2021-11-15 HISTORY — DX: Chronic atrial fibrillation, unspecified: I48.20

## 2021-11-15 HISTORY — PX: INTRAOPERATIVE TRANSTHORACIC ECHOCARDIOGRAM: SHX6523

## 2021-11-15 LAB — ECHOCARDIOGRAM LIMITED
AR max vel: 3.82 cm2
AV Area VTI: 2.8 cm2
AV Area mean vel: 1.55 cm2
AV Mean grad: 2 mmHg
AV Peak grad: 3.7 mmHg
Ao pk vel: 0.96 m/s
MV VTI: 1.56 cm2

## 2021-11-15 LAB — POCT I-STAT, CHEM 8
BUN: 16 mg/dL (ref 8–23)
Calcium, Ion: 1.22 mmol/L (ref 1.15–1.40)
Chloride: 103 mmol/L (ref 98–111)
Creatinine, Ser: 0.7 mg/dL (ref 0.61–1.24)
Glucose, Bld: 148 mg/dL — ABNORMAL HIGH (ref 70–99)
HCT: 34 % — ABNORMAL LOW (ref 39.0–52.0)
Hemoglobin: 11.6 g/dL — ABNORMAL LOW (ref 13.0–17.0)
Potassium: 4.2 mmol/L (ref 3.5–5.1)
Sodium: 139 mmol/L (ref 135–145)
TCO2: 27 mmol/L (ref 22–32)

## 2021-11-15 LAB — ABO/RH: ABO/RH(D): O POS

## 2021-11-15 LAB — PREPARE RBC (CROSSMATCH)

## 2021-11-15 SURGERY — IMPLANTATION, AORTIC VALVE, TRANSCATHETER, FEMORAL APPROACH
Anesthesia: Monitor Anesthesia Care | Laterality: Right

## 2021-11-15 MED ORDER — SODIUM CHLORIDE 0.9% FLUSH: 3.0000 mL | Freq: Two times a day (BID) | INTRAVENOUS | Status: DC

## 2021-11-15 MED ORDER — IOHEXOL 350 MG/ML SOLN
INTRAVENOUS | Status: DC | PRN
  Administered 2021-11-15: 120 mL

## 2021-11-15 MED ORDER — HYDRALAZINE HCL 20 MG/ML IJ SOLN: 10.0000 mg | INTRAMUSCULAR | Status: AC | PRN

## 2021-11-15 MED ORDER — HEPARIN SODIUM (PORCINE) 1000 UNIT/ML IJ SOLN
INTRAMUSCULAR | Status: DC | PRN
  Administered 2021-11-15: 20000 [IU] via INTRAVENOUS
  Administered 2021-11-15: 2000 [IU] via INTRAVENOUS

## 2021-11-15 MED ORDER — CHLORHEXIDINE GLUCONATE 4 % EX LIQD: 60.0000 mL | Freq: Once | CUTANEOUS | Status: DC

## 2021-11-15 MED ORDER — HEPARIN (PORCINE) IN NACL 1000-0.9 UT/500ML-% IV SOLN
INTRAVENOUS | Status: AC
  Filled 2021-11-15: qty 500

## 2021-11-15 MED ORDER — PROPOFOL 500 MG/50ML IV EMUL
INTRAVENOUS | Status: DC | PRN
  Administered 2021-11-15: 30 ug/kg/min via INTRAVENOUS

## 2021-11-15 MED ORDER — ACETAMINOPHEN 650 MG RE SUPP: 650.0000 mg | Freq: Four times a day (QID) | RECTAL | Status: DC | PRN

## 2021-11-15 MED ORDER — VERAPAMIL HCL 2.5 MG/ML IV SOLN
INTRAVENOUS | Status: AC
  Filled 2021-11-15: qty 2

## 2021-11-15 MED ORDER — TRAMADOL HCL 50 MG PO TABS: 50.0000 mg | ORAL_TABLET | ORAL | Status: DC | PRN

## 2021-11-15 MED ORDER — LACTATED RINGERS IV SOLN: INTRAVENOUS | Status: DC

## 2021-11-15 MED ORDER — CHLORHEXIDINE GLUCONATE 0.12 % MT SOLN
15.0000 mL | Freq: Once | OROMUCOSAL | Status: AC
  Administered 2021-11-15: 15 mL via OROMUCOSAL
  Filled 2021-11-15: qty 15

## 2021-11-15 MED ORDER — ONDANSETRON HCL 4 MG/2ML IJ SOLN: 4.0000 mg | Freq: Four times a day (QID) | INTRAMUSCULAR | Status: DC | PRN

## 2021-11-15 MED ORDER — ALBUMIN HUMAN 5 % IV SOLN
INTRAVENOUS | Status: DC | PRN
Start: 2021-11-15 — End: 2021-11-15

## 2021-11-15 MED ORDER — SODIUM CHLORIDE 0.9 % IV SOLN: 250.0000 mL | INTRAVENOUS | Status: DC | PRN

## 2021-11-15 MED ORDER — SODIUM CHLORIDE 0.9 % IV SOLN: INTRAVENOUS | Status: DC

## 2021-11-15 MED ORDER — PROTAMINE SULFATE 10 MG/ML IV SOLN
INTRAVENOUS | Status: DC | PRN
  Administered 2021-11-15: 190 mg via INTRAVENOUS

## 2021-11-15 MED ORDER — SODIUM CHLORIDE 0.9 % IV SOLN: INTRAVENOUS | Status: AC

## 2021-11-15 MED ORDER — SODIUM CHLORIDE 0.9% FLUSH: 3.0000 mL | INTRAVENOUS | Status: DC | PRN

## 2021-11-15 MED ORDER — ROCURONIUM BROMIDE 100 MG/10ML IV SOLN
INTRAVENOUS | Status: DC | PRN
  Administered 2021-11-15: 20 mg via INTRAVENOUS
  Administered 2021-11-15: 50 mg via INTRAVENOUS
  Administered 2021-11-15: 20 mg via INTRAVENOUS

## 2021-11-15 MED ORDER — EPHEDRINE SULFATE-NACL 50-0.9 MG/10ML-% IV SOSY
PREFILLED_SYRINGE | INTRAVENOUS | Status: DC | PRN
  Administered 2021-11-15: 10 mg via INTRAVENOUS
  Administered 2021-11-15 (×2): 5 mg via INTRAVENOUS

## 2021-11-15 MED ORDER — SODIUM CHLORIDE 0.9% IV SOLUTION: Freq: Once | INTRAVENOUS | Status: DC

## 2021-11-15 MED ORDER — SUCCINYLCHOLINE CHLORIDE 200 MG/10ML IV SOSY
PREFILLED_SYRINGE | INTRAVENOUS | Status: DC | PRN
Start: 2021-11-15 — End: 2021-11-15
  Administered 2021-11-15: 140 mg via INTRAVENOUS

## 2021-11-15 MED ORDER — FENTANYL CITRATE (PF) 100 MCG/2ML IJ SOLN
INTRAMUSCULAR | Status: DC | PRN
  Administered 2021-11-15 (×4): 25 ug via INTRAVENOUS

## 2021-11-15 MED ORDER — FENTANYL CITRATE (PF) 100 MCG/2ML IJ SOLN
INTRAMUSCULAR | Status: AC
  Filled 2021-11-15: qty 2

## 2021-11-15 MED ORDER — CHLORHEXIDINE GLUCONATE 4 % EX LIQD: 30.0000 mL | CUTANEOUS | Status: DC

## 2021-11-15 MED ORDER — OXYCODONE HCL 5 MG PO TABS: 5.0000 mg | ORAL_TABLET | ORAL | Status: DC | PRN

## 2021-11-15 MED ORDER — PROPOFOL 10 MG/ML IV BOLUS
INTRAVENOUS | Status: DC | PRN
Start: 2021-11-15 — End: 2021-11-15
  Administered 2021-11-15 (×2): 20 mg via INTRAVENOUS
  Administered 2021-11-15: 10 mg via INTRAVENOUS

## 2021-11-15 MED ORDER — NITROGLYCERIN IN D5W 200-5 MCG/ML-% IV SOLN: 0.0000 ug/min | INTRAVENOUS | Status: DC

## 2021-11-15 MED ORDER — ACETAMINOPHEN 325 MG PO TABS: 650.0000 mg | ORAL_TABLET | Freq: Four times a day (QID) | ORAL | Status: DC | PRN

## 2021-11-15 MED ORDER — LIDOCAINE HCL (PF) 1 % IJ SOLN
INTRAMUSCULAR | Status: AC
  Filled 2021-11-15: qty 30

## 2021-11-15 MED ORDER — LIDOCAINE HCL (PF) 1 % IJ SOLN
INTRAMUSCULAR | Status: DC | PRN
  Administered 2021-11-15 (×2): 10 mL

## 2021-11-15 MED ORDER — HEPARIN (PORCINE) IN NACL 1000-0.9 UT/500ML-% IV SOLN
INTRAVENOUS | Status: DC | PRN
  Administered 2021-11-15 (×2): 500 mL

## 2021-11-15 MED ORDER — CEFAZOLIN SODIUM-DEXTROSE 2-4 GM/100ML-% IV SOLN
2.0000 g | Freq: Three times a day (TID) | INTRAVENOUS | Status: AC
  Administered 2021-11-15 (×2): 2 g via INTRAVENOUS
  Filled 2021-11-15 (×2): qty 100

## 2021-11-15 MED ORDER — ACETAMINOPHEN 325 MG PO TABS: 650.0000 mg | ORAL_TABLET | ORAL | Status: DC | PRN

## 2021-11-15 MED ORDER — LABETALOL HCL 5 MG/ML IV SOLN: 10.0000 mg | INTRAVENOUS | Status: AC | PRN

## 2021-11-15 MED ORDER — SUGAMMADEX SODIUM 200 MG/2ML IV SOLN
INTRAVENOUS | Status: DC | PRN
  Administered 2021-11-15: 300 mg via INTRAVENOUS

## 2021-11-15 MED ORDER — MORPHINE SULFATE (PF) 2 MG/ML IV SOLN: 1.0000 mg | INTRAVENOUS | Status: DC | PRN

## 2021-11-15 SURGICAL SUPPLY — 49 items
BAG SNAP BAND KOVER 36X36 (MISCELLANEOUS) ×8 IMPLANT
BALLN MUSTANG 12.0X40 75 (BALLOONS) ×3
BALLOON MUSTANG 12.0X40 75 (BALLOONS) IMPLANT
BLANKET WARM UNDERBOD FULL ACC (MISCELLANEOUS) ×3 IMPLANT
CABLE ADAPT PACING TEMP 12FT (ADAPTER) ×1 IMPLANT
CATH 26 ULTRA DELIVERY (CATHETERS) ×2 IMPLANT
CATH CROSS OVER TEMPO 5F (CATHETERS) ×1 IMPLANT
CATH DIAG 6FR PIGTAIL ANGLED (CATHETERS) ×2 IMPLANT
CATH INFINITI 6F AL1 (CATHETERS) ×1 IMPLANT
CATH INFINITI 6F AL2 (CATHETERS) ×1 IMPLANT
CATH OMNI FLUSH 5F 65CM (CATHETERS) ×1 IMPLANT
CATH S G BIP PACING (CATHETERS) ×1 IMPLANT
CLOSURE PERCLOSE PROSTYLE (VASCULAR PRODUCTS) ×5 IMPLANT
CRIMPER (MISCELLANEOUS) ×1 IMPLANT
DERMABOND ADVANCED (GAUZE/BANDAGES/DRESSINGS) ×1
DERMABOND ADVANCED .7 DNX12 (GAUZE/BANDAGES/DRESSINGS) IMPLANT
DEVICE INFLATION ATRION QL2530 (MISCELLANEOUS) ×1 IMPLANT
DEVICE RAD COMP TR BAND LRG (VASCULAR PRODUCTS) ×1 IMPLANT
ELECT DEFIB PAD ADLT CADENCE (PAD) ×1 IMPLANT
GLIDESHEATH SLEND SS 6F .021 (SHEATH) ×1 IMPLANT
GLIDEWIRE ADV .035X260CM (WIRE) ×1 IMPLANT
GUIDEWIRE INQWIRE 1.5J.035X260 (WIRE) IMPLANT
GUIDEWIRE SAFE TJ AMPLATZ EXST (WIRE) ×2 IMPLANT
INQWIRE 1.5J .035X260CM (WIRE) ×3
KIT HEART LEFT (KITS) ×3 IMPLANT
KIT MICROPUNCTURE NIT STIFF (SHEATH) ×1 IMPLANT
KIT SAPIAN 3 ULTRA RESILIA 26 (Valve) ×1 IMPLANT
PACK CARDIAC CATHETERIZATION (CUSTOM PROCEDURE TRAY) ×3 IMPLANT
PATCH VASC XENOSURE 1CMX6CM (Vascular Products) ×1 IMPLANT
PATCH VASC XENOSURE 1X6 (Vascular Products) IMPLANT
SHEATH AVANTI 11F 11CM (SHEATH) ×1 IMPLANT
SHEATH BRITE TIP 7FR 35CM (SHEATH) ×1 IMPLANT
SHEATH HYDRO PINNACLE 6FR 45 (SHEATH) ×1 IMPLANT
SHEATH INTRODUCER SET 20-26 (SHEATH) ×2 IMPLANT
SHEATH PINNACLE 6F 10CM (SHEATH) ×1 IMPLANT
SHEATH PINNACLE 8F 10CM (SHEATH) ×1 IMPLANT
SHEATH PROBE COVER 6X72 (BAG) ×1 IMPLANT
SLEEVE REPOSITIONING LENGTH 30 (MISCELLANEOUS) ×1 IMPLANT
STOPCOCK MORSE 400PSI 3WAY (MISCELLANEOUS) ×6 IMPLANT
SYR MEDRAD MARK V 150ML (SYRINGE) ×1 IMPLANT
TRANSDUCER W/STOPCOCK (MISCELLANEOUS) ×6 IMPLANT
TUBING CIL FLEX 10 FLL-RA (TUBING) ×1 IMPLANT
TUBING CONTRAST HIGH PRESS 48 (TUBING) ×1 IMPLANT
WIRE AMPLATZ SS-J .035X180CM (WIRE) ×1 IMPLANT
WIRE EMERALD 3MM-J .035X150CM (WIRE) ×1 IMPLANT
WIRE EMERALD 3MM-J .035X260CM (WIRE) ×1 IMPLANT
WIRE EMERALD ST .035X260CM (WIRE) ×1 IMPLANT
WIRE HI TORQ VERSACORE-J 145CM (WIRE) ×1 IMPLANT
WIRE ROSEN-J .035X260CM (WIRE) IMPLANT

## 2021-11-15 NOTE — Transfer of Care (Addendum)
Immediate Anesthesia Transfer of Care Note  Patient: Louis Koch  Procedure(s) Performed: TRANSCATHETER AORTIC VALVE REPLACEMENT, TRANSFEMORAL (Right) INTRAOPERATIVE TRANSTHORACIC ECHOCARDIOGRAM  Patient Location: Cath Lab  Anesthesia Type:General  Level of Consciousness: drowsy  Airway & Oxygen Therapy: Patient Spontanous Breathing and Patient connected to nasal cannula oxygen  Post-op Assessment: Report given to RN and Post -op Vital signs reviewed and stable  Post vital signs: Reviewed and stable. Pt started on low dose levophed gtt in recovery. BP responding and stable. Dr. Mal Amabile aware.  Last Vitals:  Vitals Value Taken Time  BP 98/66 11/15/21 1356  Temp 35.8 C 11/15/21 1354  Pulse 55 11/15/21 1359  Resp 17 11/15/21 1359  SpO2 98 % 11/15/21 1359  Vitals shown include unvalidated device data.  Last Pain:  Vitals:   11/15/21 1354  TempSrc: Temporal  PainSc: 0-No pain         Complications:  Encounter Notable Events  Notable Event Outcome Phase Comment  Other vascular complications requiring treatment  Intraprocedure valve damaged and unable to be removed percutaneously, cutdown needed.

## 2021-11-15 NOTE — Anesthesia Procedure Notes (Signed)
Procedure Name: MAC Date/Time: 11/15/2021 9:15 AM Performed by: Carolan Clines, CRNA Pre-anesthesia Checklist: Patient identified, Emergency Drugs available, Suction available and Patient being monitored Patient Re-evaluated:Patient Re-evaluated prior to induction Oxygen Delivery Method: Simple face mask Dental Injury: Teeth and Oropharynx as per pre-operative assessment

## 2021-11-15 NOTE — Op Note (Signed)
NAME: Louis Koch    MRN: [id number] DOB: Aug 31, 1927    DATE OF OPERATION: 11/15/2021  PREOP DIAGNOSIS:    Severe aortic stenosis  POSTOP DIAGNOSIS:    Same  PROCEDURE:    Right iliofemoral exposure Iliofemoral endarterectomy, bovine pericardial patch  SURGEON: Broadus John  ASSIST: Dr. Arvid Right  ANESTHESIA: General   EBL: 256ml  INDICATIONS:    Louis Koch is a 88 y.o. male with severe aortic stenosis.  I was called into the room urgently after the aortic valve mal-deployed, and would not reconform to reenter the sheath.  The device and aortic valve are pulled back into the right external iliac artery. Diagnostic angiography demonstrated no vascular injury. After discussing the above with Drs. Arvid Right and Lauree Chandler, we made the decision that Louis Koch would be best served with right common femoral artery cutdown, sheath device removal, followed by repair of the artery.  FINDINGS:   Intimal injury to the distal external iliac, common femoral artery.  TECHNIQUE:   The patient was already heparinized to an ACT greater than 250. He already had left-sided access that was an 22 [place] sheath.  From this access, a wire and catheter were used to traverse the aortic bifurcation.  A 6 mm x 45 cm sheath was then driven through the 11 French sheath and parked in the right external iliac artery. Preoperative imaging demonstrated the common iliac artery was measuring 11.5 mm.  Therefore, a 12 mm x 40 mm Mustang balloon was brought onto the field and inflated in the right common iliac artery for proximal control.  Next, a longitudinal incision was made extending from the percutaneous access point for sufficient exposure of the right distal external iliac and common femoral artery. In an effort to gain control of the external iliac, a small portion of the inguinal ligament was incised.  This allowed for access to the retroperitoneum.  Next, the external iliac  artery, inferior epigastric, common femoral artery were controlled using Vesseloops. The sheath and device were removed followed by clamping of the external iliac artery.  The removal of the device caused shearing of the intima in the external iliac and common femoral artery.  This did not appear amenable to primary repair.  I searched locally for vein to uses the patch, however did not find any that was usable, and therefore a 1 x 6 cm bovine pericardial patch was brought into the field.  Potts were used to extend the arteriotomy proximally, followed by endarterectomy of the sheared intima.  Inflow and retrograde flow were checked, and found to be excellent.  The bovine pericardial patch was brought onto the field and sewn in running fashion using 5-0 Prolene suture.  Prior to completion of anastomosis inflow and retrograde flow were again checked to ensure there was no thrombotic burden.  On completion of the anastomosis there was an excellent pulse.  Follow diagnostic angiogram demonstrated no flow-limiting stenosis, no dissection through the right iliac system and right common femoral artery.  At this point, the patient was still heparinized and therefore I elected not to close the groin.  After discussing this with Drs. Bartle and McAlhany, the decision was made to pack the wound and close after TAVR and protamine administration.  I was appreciative of Dr. Cyndia Bent closing the right groin after my departure.   Given the complexity of the case a first assistant was necessary in order to expedient the procedure and safely perform the technical aspects of  the operation.  Macie Burows, MD [facility]  DATE OF DICTATION:   11/15/2021

## 2021-11-15 NOTE — Op Note (Signed)
[facility]   MULTIDISCIPLINARY HEART VALVE TEAM   TAVR OPERATIVE NOTE   Date of Procedure:  11/15/2021  Preoperative Diagnosis: Severe Aortic Stenosis   Postoperative Diagnosis: Same   Procedure:   Transcatheter Aortic Valve Replacement - Percutaneous Left Transfemoral Approach  Edwards Sapien 3 Ultra ResiliaTHV (size 26 mm, model # 9755RSL, serial # [id number])   Co-Surgeons:  Gaye Pollack, MD and   Lauree Chandler, MD   Anesthesiologist:  Raechel Ache, MD  Echocardiographer:  Viona Gilmore. O'Neal, MD  Pre-operative Echo Findings: Severe aortic stenosis Mild left ventricular systolic dysfunction  Post-operative Echo Findings: Trivial paravalvular leak Unchanged mild left ventricular systolic dysfunction   BRIEF CLINICAL NOTE AND INDICATIONS FOR SURGERY  This 88 year old active gentleman has stage D 2 severe, symptomatic, low-flow/low gradient aortic stenosis with New York Heart Association class IIl symptoms of exertional fatigue and shortness of breath consistent with chronic diastolic congestive heart failure.  He was recently admitted with congestive heart failure and acute pulmonary edema with acute hypoxic respiratory failure.  I have personally reviewed his 2D echocardiogram, cardiac catheterization, and CTA studies.  His echocardiogram shows a trileaflet aortic valve with moderate thickening and calcification with restricted leaflet mobility.  His mean gradient was only 22 mmHg but his valve looks severely stenotic.  His stroke-volume index was low at 22.  Cardiac catheterization shows mild to moderate nonobstructive coronary disease with elevated filling pressures consistent with diastolic congestive heart failure.  I agree that aortic valve replacement is indicated in this patient for relief of his symptoms and to prevent progressive left ventricular deterioration.  Given his advanced age I think that transcatheter aortic valve replacement would be the best  treatment for him.  His gated cardiac CTA shows anatomy suitable for TAVR using a SAPIEN 3 valve.  His abdominal and pelvic CTA shows adequate pelvic vascular anatomy to allow transfemoral insertion.   The patient and his son were counseled at length regarding treatment alternatives for management of severe symptomatic aortic stenosis. The risks and benefits of surgical intervention has been discussed in detail. Long-term prognosis with medical therapy was discussed. Alternative approaches such as conventional surgical aortic valve replacement, transcatheter aortic valve replacement, and palliative medical therapy were compared and contrasted at length. This discussion was placed in the context of the patient's own specific clinical presentation and past medical history. All of their questions have been addressed.    Following the decision to proceed with transcatheter aortic valve replacement, a discussion was held regarding what types of management strategies would be attempted intraoperatively in the event of life-threatening complications, including whether or not the patient would be considered a candidate for the use of cardiopulmonary bypass and/or conversion to open sternotomy for attempted surgical intervention.  At his advanced age I do not think he is a candidate for emergent sternotomy to manage any intraoperative complications.  The patient is aware of the fact that transient use of cardiopulmonary bypass may be necessary. The patient has been advised of a variety of complications that might develop including but not limited to risks of death, stroke, paravalvular leak, aortic dissection or other major vascular complications, aortic annulus rupture, device embolization, cardiac rupture or perforation, mitral regurgitation, acute myocardial infarction, arrhythmia, heart block or bradycardia requiring permanent pacemaker placement, congestive heart failure, respiratory failure, renal failure,  pneumonia, infection, other late complications related to structural valve deterioration or migration, or other complications that might ultimately cause a temporary or permanent loss of functional independence or other  long term morbidity. The patient provides full informed consent for the procedure as described and all questions were answered.      DETAILS OF THE OPERATIVE PROCEDURE  PREPARATION:    The patient was brought to the operating room on the above mentioned date and appropriate monitoring was established by the anesthesia team. The patient was placed in the supine position on the operating table.  Intravenous antibiotics were administered. The patient was monitored closely throughout the the initial procedure under conscious sedation.  We converted to general endotracheal anesthesia mid way through the procedure as noted below.   Baseline transthoracic echocardiogram was performed. The patient's abdomen and both groins were prepped and draped in a sterile manner. A time out procedure was performed.   PERIPHERAL ACCESS:    Using the modified Seldinger technique, femoral arterial and venous access was obtained with placement of 6 Fr sheaths on the left side.  A pigtail diagnostic catheter was passed through the left arterial sheath under fluoroscopic guidance into the aortic root.  A temporary transvenous pacemaker catheter was passed through the left femoral venous sheath under fluoroscopic guidance into the right ventricle.  The pacemaker was tested to ensure stable lead placement and pacemaker capture. Aortic root angiography was performed in order to determine the optimal angiographic angle for valve deployment.   TRANSFEMORAL ACCESS:   Percutaneous transfemoral access and sheath placement was performed using ultrasound guidance.  The right common femoral artery was cannulated using a micropuncture needle and appropriate location was verified using hand injection angiogram.  A pair  of Abbott Perclose percutaneous closure devices were placed and a 6 French sheath replaced into the femoral artery.  The patient was heparinized systemically and ACT verified > 250 seconds.    A 14 Fr transfemoral E-sheath was introduced into the right common femoral artery after progressively dilating over an Amplatz superstiff wire. An AL-1 catheter was used to direct a straight-tip exchange length wire across the native aortic valve into the left ventricle. This was exchanged out for a pigtail catheter and position was confirmed in the LV apex. Simultaneous LV and Ao pressures were recorded.  The pigtail catheter was exchanged for an Amplatz Extra-stiff wire in the LV apex.    BALLOON AORTIC VALVULOPLASTY:   Not performed   TRANSCATHETER HEART VALVE DEPLOYMENT:   An Edwards Sapien 3 Ultra transcatheter heart valve (size 26 mm) was prepared and crimped per manufacturer's guidelines, and the proper orientation of the valve is confirmed on the [employer] delivery system. The valve was advanced through the introducer sheath using normal technique until in an appropriate position in the abdominal aorta beyond the sheath tip. At this point it was noticed that the valve and delivery system had perforated through the side of the distal sheath at the iliac level and two of the struts had bent out to the side. After discussion with the [facility] team we decided to pull the valve and delivery system back until the valve was adjacent to the exit site of the sheath and the bent struts were up against the sheath to prevent arterial injury. Vascular surgery was consulted and Dr. Sherral Hammers arrived. A crossover catheter was used to cross from the left iliac to the right iliac artery and a 12 mm balloon was used to occlude inflow to the right iliac system. Dr. Sherral Hammers surgically exposed the right common femoral artery and with vascular control the sheath and valve and delivery system were removed. Arterial repair was  performed using  a bovine patch and arteriogram showed a nice arterial repair with no extravasation or visible injury above the repair site.   Since the patient was stable, under general anesthesia, and the vascular repair looked good we decided to continue with TAVR via the left common femoral artery. A 6 French sheath was placed in the left radial artery and a pigtail catheter was advanced into the ascending aorta.The pacemaker was repositioned and tested.    The 39 F sheath in the left femoral artery was changed to a 14 Fr transfemoral E-sheath. An AL-1 catheter was used to direct a straight-tip exchange length wire across the native aortic valve into the left ventricle. This was exchanged out for a pigtail catheter and position was confirmed in the LV apex. The pigtail catheter was exchanged for an Amplatz Extra-stiff wire in the LV apex.  A new Edwards Sapien 3 Ultra Resilia transcatheter heart valve (size 26 mm) was prepared and crimped per manufacturer's guidelines, and the proper orientation of the valve is confirmed on the [employer] delivery system. The valve was advanced through the introducer sheath using normal technique until in an appropriate position in the abdominal aorta beyond the sheath tip. The balloon was then retracted and using the fine-tuning wheel was centered on the valve. The valve was then advanced across the aortic arch using appropriate flexion of the catheter. The valve was carefully positioned across the aortic valve annulus. The Commander catheter was retracted using normal technique. Once final position of the valve has been confirmed by angiographic assessment, the valve is deployed while temporarily holding ventilation and during rapid ventricular pacing to maintain systolic blood pressure < 50 mmHg and pulse pressure < 10 mmHg. The balloon inflation is held for >3 seconds after reaching full deployment volume. Once the balloon has fully deflated the balloon is  retracted into the ascending aorta and valve function is assessed using echocardiography. There is felt to be trivial paravalvular leak and no central aortic insufficiency.  The patient's hemodynamic recovery following valve deployment is good.  The deployment balloon and guidewire are both removed.    PROCEDURE COMPLETION:   The sheath was removed and femoral artery closure performed.  Protamine was administered once femoral arterial repair was complete. The temporary pacemaker, pigtail catheter and radial and femoral sheaths were removed with manual pressure used for hemostasis. A TR band was used for the left radial artery.   The patient tolerated the procedure well and is transported to the cath lab recovery area in stable condition. There were no immediate intraoperative complications. All sponge instrument and needle counts are verified correct at completion of the operation.   No blood products were administered during the operation.  The patient received a total of 120 mL of intravenous contrast during the procedure.   Gaye Pollack, MD 11/15/2021

## 2021-11-15 NOTE — Progress Notes (Signed)
°  [facility]   MULTIDISCIPLINARY HEART VALVE TEAM  Patient doing well s/p TAVR. He is hemodynamically stable. BP soft and treated with 250 cc IVFs.  Groin sites stable. Right cut down looks good. ECG with afib with slow VR but no high grade block. Arterial line discontinued and transferred to 4E.  Plan for early ambulation after bedrest completed and hopeful discharge over the next 24-48 hours.   Cline Crock PA-C  [facility]  Pager [phone]

## 2021-11-15 NOTE — Interval H&P Note (Signed)
History and Physical Interval Note:  11/15/2021 8:03 AM  Louis Koch  has presented today for surgery, with the diagnosis of Severe Aortic Stenosis.  The various methods of treatment have been discussed with the patient and family. After consideration of risks, benefits and other options for treatment, the patient has consented to  Procedure(s): TRANSCATHETER AORTIC VALVE REPLACEMENT, TRANSFEMORAL (Right) INTRAOPERATIVE TRANSTHORACIC ECHOCARDIOGRAM (N/A) as a surgical intervention.  The patient's history has been reviewed, patient examined, no change in status, stable for surgery.  I have reviewed the patient's chart and labs.  Questions were answered to the patient's satisfaction.     Alleen Borne

## 2021-11-15 NOTE — Progress Notes (Signed)
°  Echocardiogram 2D Echocardiogram limited TAVR has been performed.  Leta Jungling M 11/15/2021, 1:02 PM

## 2021-11-15 NOTE — Progress Notes (Signed)
Pt noted to have redness and bruising to bottom with a small dime size open area on right buttock. Sacral dressing applied and small mepilex applied to open area.  Viviano Simas, RN

## 2021-11-15 NOTE — Anesthesia Procedure Notes (Signed)
Procedure Name: Intubation Date/Time: 11/15/2021 11:15 AM Performed by: Audry Pili, MD Pre-anesthesia Checklist: Patient identified, Emergency Drugs available, Suction available and Patient being monitored Patient Re-evaluated:Patient Re-evaluated prior to induction Oxygen Delivery Method: Circle System Utilized Preoxygenation: Pre-oxygenation with 100% oxygen Induction Type: IV induction and Rapid sequence Laryngoscope Size: Glidescope and 4 Grade View: Grade I Tube type: Oral Tube size: 7.5 mm Number of attempts: 1 Airway Equipment and Method: Stylet, Bougie stylet and Rigid stylet Placement Confirmation: ETT inserted through vocal cords under direct vision, positive ETCO2 and breath sounds checked- equal and bilateral Secured at: 22 cm Tube secured with: Tape Dental Injury: Teeth and Oropharynx as per pre-operative assessment  Comments: Elective glidescope intubation d/t conversion mid-case.

## 2021-11-15 NOTE — Progress Notes (Addendum)
Pt arrived to 4E from PACU, s/p TAVR. Pt A&Ox4. VSS. Groins stable, level 0. TR band in place, no bleeding. Pt oriented to room and call light in place.   Raelyn Number, RN

## 2021-11-15 NOTE — Plan of Care (Signed)

## 2021-11-15 NOTE — Anesthesia Procedure Notes (Signed)
Arterial Line Insertion Start/End1/17/2023 8:00 AM Performed by: Audry Pili, MD, Carolan Clines, CRNA  Patient location: Pre-op. Preanesthetic checklist: patient identified, IV checked, site marked, risks and benefits discussed, surgical consent, monitors and equipment checked, pre-op evaluation, timeout performed and anesthesia consent Right, radial was placed Catheter size: 20 G Hand hygiene performed  and maximum sterile barriers used   Attempts: 1 Procedure performed without using ultrasound guided technique. Following insertion, dressing applied and Biopatch. Post procedure assessment: normal  Patient tolerated the procedure well with no immediate complications. Additional procedure comments: Placed by S,Buerk, sRNA.

## 2021-11-15 NOTE — Discharge Instructions (Addendum)
ACTIVITY AND EXERCISE  Daily activity and exercise are an important part of your recovery. People recover at different rates depending on their general health and type of valve procedure.  Most people recovering from TAVR feel better relatively quickly   No lifting, pushing, pulling more than 10 pounds (examples to avoid: groceries, vacuuming, gardening, golfing):             - For one week with a procedure through the groin.             - For six weeks for procedures through the chest wall or neck. NOTE: You will typically see one of our providers 7-14 days after your procedure to discuss [place] the above activities.      DRIVING  Do not drive until you are seen for follow up and cleared by a provider. Generally, we ask patient to not drive for 1 week after their procedure.  If you have been told by your doctor in the past that you may not drive, you must talk with him/her before you begin driving again.   DRESSING  Groin site: you may leave the clear dressing over the site for up to one week or until it falls off.   HYGIENE  If you had a femoral (leg) procedure, you may take a shower when you return home. After the shower, pat the site dry. Do NOT use powder, oils or lotions in your groin area until the site has completely healed.  If you had a chest procedure, you may shower when you return home unless specifically instructed not to by your discharging practitioner.             - DO NOT scrub incision; pat dry with a towel.             - DO NOT apply any lotions, oils, powders to the incision.             - No tub baths / swimming for at least 2 weeks.  If you notice any fevers, chills, increased pain, swelling, bleeding or pus, please contact your doctor.   ADDITIONAL INFORMATION  If you are going to have an upcoming dental procedure, please contact our office as you will require antibiotics ahead of time to prevent infection on your heart valve.    If you have any questions  or concerns you can call the structural heart phone during normal business hours 8am-4pm. If you have an urgent need after hours or weekends please call [phone] to talk to the on call provider for general cardiology. If you have an emergency that requires immediate attention, please call 911.    After TAVR Checklist  Check  Test Description   Follow up appointment in 1-2 weeks  You will see our structural heart advanced practice provider. Your incision sites will be checked and you will be cleared to drive and resume all normal activities if you are doing well.     1 month echo and follow up  You will have an echo to check on your new heart valve and be seen back in the office by a structural heart advanced practice provider.   Follow up with your primary cardiologist You will need to be seen by your primary cardiologist in the following 3-6 months after your 1 month appointment in the valve clinic.    1 year echo and follow up You will have another echo to check on your heart valve after 1 year  and be seen back in the office by a structural heart advanced practice provider. This your last structural heart visit.   Bacterial endocarditis prophylaxis  You will have to take antibiotics for the rest of your life before all dental procedures (even teeth cleanings) to protect your heart valve. Antibiotics are also required before some surgeries. Please check with your cardiologist before scheduling any surgeries. Also, please make sure to tell us if you have a penicillin allergy as you will require an alternative antibiotic.       [facility]  Discharge instructions  Lower Extremity Bypass Surgery  Please refer to the following instruction for your post-procedure care. Your surgeon or physician assistant will discuss any changes with you.  Activity  You are encouraged to walk as much as you can. You can slowly return to normal activities during the month after  your surgery. Avoid strenuous activity and heavy lifting until your doctor tells you it's OK. Avoid activities such as vacuuming or swinging a golf club. Do not drive until your doctor give the OK and you are no longer taking prescription pain medications. It is also normal to have difficulty with sleep habits, eating and bowel movement after surgery. These will go away with time.  Bathing/Showering  Shower daily after you go home. Do not soak in a bathtub, hot tub, or swim until the incision heals completely.  Incision Care  Clean your incision with mild soap and water. Shower every day. Pat the area dry with a clean towel. You do not need a bandage unless otherwise instructed. Do not apply any ointments or creams to your incision. If you have open wounds you will be instructed how to care for them or a visiting nurse may be arranged for you. If you have staples or sutures along your incision they will be removed at your post-op appointment. You may have skin glue on your incision. Do not peel it off. It will come off on its own in about one week.  Wash the groin wound with soap and water daily and pat dry. (No tub bath-only shower)  Then put a dry gauze or washcloth in the groin to keep this area dry to help prevent wound infection.  Do this daily and as needed.  Do not use Vaseline or neosporin on your incisions.  Only use soap and water on your incisions and then protect and keep dry.  Diet  Resume your normal diet. There are no special food restrictions following this procedure. A low fat/ low cholesterol diet is recommended for all patients with vascular disease. In order to heal from your surgery, it is CRITICAL to get adequate nutrition. Your body requires vitamins, minerals, and protein. Vegetables are the best source of vitamins and minerals. Vegetables also provide the perfect balance of protein. Processed food has little nutritional value, so try to avoid this.  Medications  Resume  taking all your medications unless your doctor or physician assistant tells you not to. If your incision is causing pain, you may take over-the-counter pain relievers such as acetaminophen (Tylenol). If you were prescribed a stronger pain medication, please aware these medication can cause nausea and constipation. Prevent nausea by taking the medication with a snack or meal. Avoid constipation by drinking plenty of fluids and eating foods with high amount of fiber, such as fruits, vegetables, and grains. Take Colace 100 mg (an over-the-counter stool softener) twice a day as needed for constipation.  Do not take  Tylenol if you are taking prescription pain medications.  Follow Up  Our office will schedule a follow up appointment 2-3 weeks following discharge.  Please call us immediately for any of the following conditions  Severe or worsening pain in your legs or feet while at rest or while walking Increase pain, redness, warmth, or drainage (pus) from your incision site(s) Fever of 101 degree or higher The swelling in your leg with the bypass suddenly worsens and becomes more painful than when you were in the hospital If you have been instructed to feel your graft pulse then you should do so every day. If you can no longer feel this pulse, call the office immediately. Not all patients are given this instruction.  Leg swelling is common after leg bypass surgery.  The swelling should improve over a few months following surgery. To improve the swelling, you may elevate your legs above the level of your heart while you are sitting or resting. Your surgeon or physician assistant may ask you to apply an ACE wrap or wear compression (TED) stockings to help to reduce swelling.  Reduce your risk of vascular disease  Stop smoking. If you would like help call [facility] at 1-800-QUIT-NOW 601-441-0162) or [place] at [phone].  Manage your cholesterol Maintain a desired weight Control your  diabetes weight Control your diabetes Keep your blood pressure down  If you have any questions, please call the office at [phone]

## 2021-11-15 NOTE — Progress Notes (Signed)
SBP mid 80's. 250cc 0.9 NS IV fluid bolus started. Carlean Jews, PA at bedside. Bilateral groins and left radial level 0. Easily awakened, appropriate.

## 2021-11-15 NOTE — Progress Notes (Signed)
Mobility Specialist: Progress Note   11/15/21 1824  Mobility  Activity Ambulated with assistance in hallway  Level of Assistance Contact guard assist, steadying assist  Assistive Device None  Distance Ambulated (ft) 130 ft  Activity Response Tolerated well  $Mobility charge 1 Mobility   Pre-Mobility 2 L/min [place]: 72 HR, 100% SpO2 Post-Mobility on RA: 80 HR, 99% SpO2  Pt required minA to sit EOB and contact guard with gait belt during ambulation. No c/o throughout ambulation. Pt back to bed after walk and set up with his dinner. Call bell in reach.  [facility] Louis Koch Mobility Specialist Mobility Specialist 4 [place]: [phone] Mobility Specialist 2 [place] and [place]: [phone]

## 2021-11-15 NOTE — CV Procedure (Signed)
[facility]  TAVR OPERATIVE NOTE   Date of Procedure:  11/15/2021  Preoperative Diagnosis: Severe Aortic Stenosis   Postoperative Diagnosis: Same   Procedure:   Transcatheter Aortic Valve Replacement - Transfemoral Approach  Edwards Sapien 3 THV (size 26 mm, model # [id number], serial # [id number])   Co-Surgeons:  Lauree Chandler, MD and Gaye Pollack, MD   Anesthesiologist:  Fransisco Beau  Echocardiographer:  O'Neal  Pre-operative Echo Findings: Severe aortic stenosis Mild left ventricular systolic dysfunction  Post-operative Echo Findings: Trivial paravalvular leak Mild left ventricular systolic dysfunction  BRIEF CLINICAL NOTE AND INDICATIONS FOR SURGERY  88 yo male with paroxysmal atrial fibrillation, mitral valve regurgitation and severe aortic stenosis. He is a pleasant, active elderly male. He is a former Museum/gallery exhibitions officer. He was found to have atrial fibrillation in 2014 during routine pre-op testing for cataract surgery. He refused anti-coagulation. He has not been followed by cardiology. Echo in 2015 with LVEF=65-70%, aortic valve sclerosis and moderate mitral and tricuspid regurgitation. He had been in usual state of good health until the last month. He has been noticing exercise intolerance, dyspnea on exertion, orthopnea and PND. He was admitted to [facility] 11/01/21 with worsened dyspnea and was found to have acute hypoxic respiratory failure. He was volume overloaded and has been diuresed with IV Lasix with rapid improvement in his symptoms. EKG with rapid atrial fibrillation. Echo 11/02/21 with normal LV systolic function with thickened and calcified aortic valve with likely severe low flow/low gradient aortic stenosis. (Mean gradient 22 mmHg, DI 24, SVI 33, AVA 0.83 cm2).   During the course of the patient's preoperative work up they have been evaluated comprehensively by a multidisciplinary team of specialists coordinated through the [facility] in the [facility].  They have been demonstrated to suffer from symptomatic severe aortic stenosis as noted above. The patient has been counseled extensively as to the relative risks and benefits of all options for the treatment of severe aortic stenosis including long term medical therapy, conventional surgery for aortic valve replacement, and transcatheter aortic valve replacement.  The patient has been independently evaluated by Dr. Cyndia Bent with CT surgery and they are felt to be at high risk for conventional surgical aortic valve replacement. The surgeon indicated the patient would be a poor candidate for conventional surgery. Based upon review of all of the patient's preoperative diagnostic tests they are felt to be candidate for transcatheter aortic valve replacement using the transfemoral approach as an alternative to high risk conventional surgery.    Following the decision to proceed with transcatheter aortic valve replacement, a discussion has been held regarding what types of management strategies would be attempted intraoperatively in the event of life-threatening complications, including whether or not the patient would be considered a candidate for the use of cardiopulmonary bypass and/or conversion to open sternotomy for attempted surgical intervention.  The patient has been advised of a variety of complications that might develop peculiar to this approach including but not limited to risks of death, stroke, paravalvular leak, aortic dissection or other major vascular complications, aortic annulus rupture, device embolization, cardiac rupture or perforation, acute myocardial infarction, arrhythmia, heart block or bradycardia requiring permanent pacemaker placement, congestive heart failure, respiratory failure, renal failure, pneumonia, infection, other late complications related to structural valve deterioration or migration, or other complications that might  ultimately cause a temporary or permanent loss of functional independence or other long term morbidity.  The patient provides full informed  consent for the procedure as described and all questions were answered preoperatively.    DETAILS OF THE OPERATIVE PROCEDURE  PREPARATION:   The patient is brought to the operating room on the above mentioned date and central monitoring was established by the anesthesia team including placement of a radial arterial line. The patient is placed in the supine position on the operating table.  Intravenous antibiotics are administered. Conscious sedation is used but was changed to general anesthesia mid way through the case. (See below)  Baseline transthoracic echocardiogram was performed. The patient's chest, abdomen, both groins, and both lower extremities are prepared and draped in a sterile manner. A time out procedure is performed.   PERIPHERAL ACCESS:   Using the modified Seldinger technique, femoral arterial and venous access were obtained with placement of a 6 Fr sheath in the artery and a 7 Fr sheath in the vein on the left side using u/s guidance.  A pigtail diagnostic catheter was passed through the femoral arterial sheath under fluoroscopic guidance into the aortic root.  A temporary transvenous pacemaker catheter was passed through the femoral venous sheath under fluoroscopic guidance into the right ventricle.  The pacemaker was tested to ensure stable lead placement and pacemaker capture. Aortic root angiography was performed in order to determine the optimal angiographic angle for valve deployment.  TRANSFEMORAL ACCESS:  A micropuncture kit was used to gain access to the right femoral artery using u/s guidance. Position confirmed with angiography. Pre-closure with double ProGlide closure devices. The patient was heparinized systemically and ACT verified > 250 seconds.    A 14 Fr transfemoral E-sheath was introduced into the right femoral artery after  progressively dilating over an Amplatz superstiff wire. An AL-1 catheter was used to direct a straight-tip exchange length wire across the native aortic valve into the left ventricle. This was exchanged out for a pigtail catheter and position was confirmed in the LV apex. Simultaneous LV and Ao pressures were recorded.  The pigtail catheter was then exchanged for an Amplatz Extra-stiff wire in the LV apex.   TRANSCATHETER HEART VALVE DEPLOYMENT:  An Edwards Sapien 3 THV (size 26 mm) was prepared and crimped per manufacturer's guidelines, and the proper orientation of the valve is confirmed on the [employer] delivery system. The valve was advanced through the introducer sheath using normal technique until in an appropriate position in the abdominal aorta beyond the sheath tip.   At this point in the procedure, it was noted that the delivery system had perforated through the distal sheath. The valve stent appeared to have a bent strut. After discussion with the [facility] team, we elected to pull the entire system and sheath back to the external iliac artery with the bent stent strut positioned near the sheath.   Vascular surgery was consulted. The left femoral artery sheath was then exchanged for a 11 French sheath to prepare for potential aortic disruption and need for a balloon occlusion technique.  Double Perclose devices placed.   Dr. Unk Lightning from Vascular surgery entered the case. A crossover catheter was used to cross from the left iliac system into the right iliac system. Please see his note for details. A 12 mm balloon was used to occlude flow in the right iliac system. Dr. Unk Lightning then performed a surgical cutdown to the right femoral artery. The sheath and valve deployment system was removed. The right common femoral artery was repaired with a bovine patch. Angiography demonstrated stable patch repair and no evidence of aortic or  iliac disruption   A 6 French sheath was placed in the left  radial artery and a pigtail catheter was advanced into the ascending aorta. The temporary pacing wire was redirected into the RV. The 93 French sheath in the left femoral artery was changed for a 14 French transfemoral E-sheath. An AL-1 catheter was used to direct a straight-tip exchange length wire across the native aortic valve into the left ventricle. This was exchanged out for a pigtail catheter and position was confirmed in the LV apex. Simultaneous LV and Ao pressures were recorded.  The pigtail catheter was then exchanged for an Amplatz Extra-stiff wire in the LV apex. An Edwards Sapien 3 THV (size 26 mm) was prepared and crimped per manufacturer's guidelines, and the proper orientation of the valve is confirmed on the [employer] delivery system. The valve was advanced through the introducer sheath using normal technique until in an appropriate position in the abdominal aorta beyond the sheath tip.   The balloon was then retracted and using the fine-tuning wheel was centered on the valve. The valve was then advanced across the aortic arch using appropriate flexion of the catheter. The valve was carefully positioned across the aortic valve annulus. The Commander catheter was retracted using normal technique. Once final position of the valve has been confirmed by angiographic assessment, the valve is deployed while temporarily holding ventilation and during rapid ventricular pacing to maintain systolic blood pressure < 50 mmHg and pulse pressure < 10 mmHg. The balloon inflation is held for >3 seconds after reaching full deployment volume. Once the balloon has fully deflated the balloon is retracted into the ascending aorta and valve function is assessed using TTE. There is felt to be trivial paravalvular leak and no central aortic insufficiency.  The patient's hemodynamic recovery following valve deployment is good.  The deployment balloon and guidewire are both removed. Echo demostrated acceptable  post-procedural gradients, stable mitral valve function, and trivial AI.   Right femoral wound closure by Dr. Cyndia Bent.    PROCEDURE COMPLETION:  The sheath was then removed and closure devices were completed. Protamine was administered once femoral arterial repair was complete. The temporary pacemaker was removed. Radial sheath removed and TR band placed on left wrist.    The patient tolerated the procedure well and is transported to the surgical intensive care in stable condition. There were no immediate intraoperative complications. All sponge instrument and needle counts are verified correct at completion of the operation.   No blood products were administered during the operation.  The patient received a total of 120 mL of intravenous contrast during the procedure.  Lauree Chandler MD 11/15/2021 3:16 PM

## 2021-11-16 ENCOUNTER — Encounter (HOSPITAL_COMMUNITY): Payer: Self-pay | Admitting: Cardiovascular Disease

## 2021-11-16 ENCOUNTER — Inpatient Hospital Stay (HOSPITAL_COMMUNITY): Payer: Medicare Other

## 2021-11-16 DIAGNOSIS — Z954 Presence of other heart-valve replacement: Secondary | ICD-10-CM

## 2021-11-16 DIAGNOSIS — Z952 Presence of prosthetic heart valve: Secondary | ICD-10-CM

## 2021-11-16 DIAGNOSIS — I350 Nonrheumatic aortic (valve) stenosis: Secondary | ICD-10-CM

## 2021-11-16 LAB — POCT ACTIVATED CLOTTING TIME
Activated Clotting Time: 275 seconds
Activated Clotting Time: 251 seconds
Activated Clotting Time: 263 seconds

## 2021-11-16 LAB — ECHOCARDIOGRAM COMPLETE
AR max vel: 2.64 cm2
AV Area VTI: 2.35 cm2
AV Area mean vel: 2.52 cm2
AV Mean grad: 7.5 mmHg
AV Peak grad: 12.1 mmHg
Ao pk vel: 1.74 m/s
Area-P 1/2: 5.27 cm2
Calc EF: 48.2 %
Height: 77 in
MV VTI: 1.88 cm2
S' Lateral: 4.1 cm
Single Plane A2C EF: 53 %
Single Plane A4C EF: 46.3 %
Weight: 4455.06 oz

## 2021-11-16 LAB — BASIC METABOLIC PANEL
Anion gap: 9 (ref 5–15)
BUN: 15 mg/dL (ref 8–23)
CO2: 23 mmol/L (ref 22–32)
Calcium: 8.3 mg/dL — ABNORMAL LOW (ref 8.9–10.3)
Chloride: 104 mmol/L (ref 98–111)
Creatinine, Ser: 0.82 mg/dL (ref 0.61–1.24)
GFR, Estimated: >60 mL/min (ref >60)
Glucose, Bld: 99 mg/dL (ref 70–99)
Potassium: 4.1 mmol/L (ref 3.5–5.1)
Sodium: 136 mmol/L (ref 135–145)

## 2021-11-16 LAB — POCT I-STAT 7, (LYTES, BLD GAS, ICA,H+H)
Acid-Base Excess: 1 mmol/L (ref 0.0–2.0)
Acid-Base Excess: 0 mmol/L (ref 0.0–2.0)
Bicarbonate: 25 mmol/L (ref 20.0–28.0)
Bicarbonate: 24.7 mmol/L (ref 20.0–28.0)
Calcium, Ion: 1.22 mmol/L (ref 1.15–1.40)
Calcium, Ion: 1.18 mmol/L (ref 1.15–1.40)
HCT: 38 % — ABNORMAL LOW (ref 39.0–52.0)
HCT: 34 % — ABNORMAL LOW (ref 39.0–52.0)
Hemoglobin: 12.9 g/dL — ABNORMAL LOW (ref 13.0–17.0)
Hemoglobin: 11.6 g/dL — ABNORMAL LOW (ref 13.0–17.0)
O2 Saturation: 97 %
O2 Saturation: 99 %
Potassium: 4.2 mmol/L (ref 3.5–5.1)
Potassium: 4.8 mmol/L (ref 3.5–5.1)
Sodium: 138 mmol/L (ref 135–145)
Sodium: 137 mmol/L (ref 135–145)
TCO2: 26 mmol/L (ref 22–32)
TCO2: 26 mmol/L (ref 22–32)
pCO2 arterial: 38.1 mmHg (ref 32.0–48.0)
pCO2 arterial: 38.5 mmHg (ref 32.0–48.0)
pH, Arterial: 7.426 (ref 7.350–7.450)
pH, Arterial: 7.415 (ref 7.350–7.450)
pO2, Arterial: 88 mmHg (ref 83.0–108.0)
pO2, Arterial: 113 mmHg — ABNORMAL HIGH (ref 83.0–108.0)

## 2021-11-16 LAB — POCT I-STAT, CHEM 8
BUN: 16 mg/dL (ref 8–23)
BUN: 15 mg/dL (ref 8–23)
Calcium, Ion: 1.24 mmol/L (ref 1.15–1.40)
Calcium, Ion: 1.2 mmol/L (ref 1.15–1.40)
Chloride: 102 mmol/L (ref 98–111)
Chloride: 103 mmol/L (ref 98–111)
Creatinine, Ser: 0.7 mg/dL (ref 0.61–1.24)
Creatinine, Ser: 0.6 mg/dL — ABNORMAL LOW (ref 0.61–1.24)
Glucose, Bld: 162 mg/dL — ABNORMAL HIGH (ref 70–99)
Glucose, Bld: 165 mg/dL — ABNORMAL HIGH (ref 70–99)
HCT: 39 % (ref 39.0–52.0)
HCT: 36 % — ABNORMAL LOW (ref 39.0–52.0)
Hemoglobin: 13.3 g/dL (ref 13.0–17.0)
Hemoglobin: 12.2 g/dL — ABNORMAL LOW (ref 13.0–17.0)
Potassium: 4.7 mmol/L (ref 3.5–5.1)
Potassium: 4.4 mmol/L (ref 3.5–5.1)
Sodium: 137 mmol/L (ref 135–145)
Sodium: 138 mmol/L (ref 135–145)
TCO2: 26 mmol/L (ref 22–32)
TCO2: 24 mmol/L (ref 22–32)

## 2021-11-16 LAB — TYPE AND SCREEN
ABO/RH(D): O POS
Antibody Screen: NEGATIVE
Unit division: 0
Unit division: 0
Unit division: 0
Unit division: 0

## 2021-11-16 LAB — BPAM RBC
Blood Product Expiration Date: 202301212359
Blood Product Expiration Date: 202302112359
Blood Product Expiration Date: 202302112359
Blood Product Expiration Date: 202302112359
ISSUE DATE / TIME: 202301171047
ISSUE DATE / TIME: 202301171047
ISSUE DATE / TIME: 202301171047
ISSUE DATE / TIME: 202301171047
Unit Type and Rh: 5100
Unit Type and Rh: 5100
Unit Type and Rh: 5100
Unit Type and Rh: 5100

## 2021-11-16 LAB — CBC
HCT: 35.1 % — ABNORMAL LOW (ref 39.0–52.0)
Hemoglobin: 11.5 g/dL — ABNORMAL LOW (ref 13.0–17.0)
MCH: 31.4 pg (ref 26.0–34.0)
MCHC: 32.8 g/dL (ref 30.0–36.0)
MCV: 95.9 fL (ref 80.0–100.0)
Platelets: 174 10*3/uL (ref 150–400)
RBC: 3.66 MIL/uL — ABNORMAL LOW (ref 4.22–5.81)
RDW: 14 % (ref 11.5–15.5)
WBC: 8.9 10*3/uL (ref 4.0–10.5)
nRBC: 0 % (ref 0.0–0.2)

## 2021-11-16 LAB — MAGNESIUM: Magnesium: 2 mg/dL (ref 1.7–2.4)

## 2021-11-16 MED ORDER — PERFLUTREN LIPID MICROSPHERE
1.0000 mL | INTRAVENOUS | Status: AC | PRN
  Administered 2021-11-16: 2 mL via INTRAVENOUS
  Filled 2021-11-16: qty 10

## 2021-11-16 MED FILL — Heparin Sod (Porcine)-NaCl IV Soln 1000 Unit/500ML-0.9%: INTRAVENOUS | Qty: 500 | Status: AC

## 2021-11-16 MED FILL — Verapamil HCl IV Soln 2.5 MG/ML: INTRAVENOUS | Qty: 2 | Status: AC

## 2021-11-16 NOTE — Progress Notes (Signed)
CARDIAC REHAB PHASE I   PRE:  Rate/Rhythm: 98 Afib  BP:  Sitting: 127/89      SaO2: 96 RA  MODE:  Ambulation: 90 ft   POST:  Rate/Rhythm: 137 Afib  BP:  Sitting: 144/118    SaO2: 95 RA   Pt ambulated 149ft in hallway assist of one. Pt denies CP or SOB. Pt c/o some minor dizziness but resolved quickly after sitting. Pt and SO educated on site care and restrictions. Encouraged continued mobility with emphasis on safety. Pt declines CRP II at this time.  [id number] Rufina Falco, RN BSN 11/16/2021 10:20 AM

## 2021-11-16 NOTE — Anesthesia Postprocedure Evaluation (Signed)
Anesthesia Post Note  Patient: Louis Koch  Procedure(s) Performed: TRANSCATHETER AORTIC VALVE REPLACEMENT, TRANSFEMORAL (Right) INTRAOPERATIVE TRANSTHORACIC ECHOCARDIOGRAM     Patient location during evaluation: PACU Anesthesia Type: General Level of consciousness: awake and alert Pain management: pain level controlled Vital Signs Assessment: post-procedure vital signs reviewed and stable Respiratory status: spontaneous breathing, nonlabored ventilation, respiratory function stable and patient connected to nasal cannula oxygen Cardiovascular status: blood pressure returned to baseline and stable Postop Assessment: no apparent nausea or vomiting Anesthetic complications: yes   Encounter Notable Events  Notable Event Outcome Phase Comment  Other vascular complications requiring treatment  Intraprocedure valve damaged and unable to be removed percutaneously, cutdown needed.    Last Vitals:  Vitals:   11/16/21 0441 11/16/21 0803  BP: 113/75 125/75  Pulse: 95 90  Resp: 18 20  Temp: 36.4 C 36.4 C  SpO2: 99% 99%    Last Pain:  Vitals:   11/16/21 0803  TempSrc: Oral  PainSc:                  [name]

## 2021-11-16 NOTE — TOC Transition Note (Signed)
Transition of Care (TOC) - CM/SW Discharge Note Donn Pierini RN, BSN Transitions of Care Unit 4E- RN Case Manager See Treatment Team for direct phone #    Patient Details  Name: Louis Koch MRN: [id number] Date of Birth: 1926/11/09  [facility] CM/SW Contact:  Darrold Span, RN Phone Number: 11/16/2021, 10:54 AM   Clinical Narrative:    Pt from home, s/p TAVR. Plan to return home w/ GF to assist. Pt stable for transition home today. GF to transport home. Transition of Care Department [facility]) has reviewed patient and no TOC needs have been identified at this time.   Final next level of care: Home/Self Care Barriers to Discharge: No Barriers Identified   Patient Goals and CMS Choice     Choice offered to / list presented to : NA  Discharge Placement               Home        Discharge Plan and Services In-house Referral: NA Discharge Planning Services: NA Post Acute Care Choice: NA          DME Arranged: N/A DME Agency: NA       HH Arranged: NA HH Agency: NA        Social Determinants of Health (SDOH) Interventions     Readmission Risk Interventions Readmission Risk Prevention Plan 11/16/2021  Post Dischage Appt Complete  Medication Screening Complete  Transportation Screening Complete  Some recent data might be hidden

## 2021-11-16 NOTE — Discharge Summary (Signed)
[place] VALVE TEAM  Discharge Summary    Patient ID: Louis Koch MRN: [id number]; DOB: 03-Feb-1927  Admit date: 11/15/2021 Discharge date: 11/16/2021  Primary Care Provider: Merryl Hacker, No  Primary Cardiologist: Buford Dresser, MD / Dr. Angelena Form & Dr. Cyndia Bent (TAVR)  Discharge Diagnoses    Principal Problem:   S/P TAVR (transcatheter aortic valve replacement) Active Problems:   Severe aortic stenosis   Acute diastolic CHF (congestive heart failure) (HCC)   Hypothyroidism   Obesity   Osteoarthritis   Atrial fibrillation, chronic (HCC)   Allergies No Known Allergies  Diagnostic Studies/Procedures    TAVR OPERATIVE NOTE     Date of Procedure:                11/15/2021   Preoperative Diagnosis:      Severe Aortic Stenosis    Postoperative Diagnosis:    Same    Procedure:        Transcatheter Aortic Valve Replacement - Percutaneous Left Transfemoral Approach             Edwards Sapien 3 Ultra ResiliaTHV (size 26 mm, model # 9755RSL, serial # [id number])              Co-Surgeons:                        Gaye Pollack, MD and   Lauree Chandler, MD     Anesthesiologist:                  Raechel Ache, MD   Echocardiographer:              Viona Gilmore. O'Neal, MD   Pre-operative Echo Findings: Severe aortic stenosis Mild left ventricular systolic dysfunction   Post-operative Echo Findings: Trivial paravalvular leak Unchanged mild left ventricular systolic dysfunction _____________    Echo 11/16/21: completed but pending formal read at the time of discharge   History of Present Illness     Louis Koch is a 88 y.o. male with a history of prosthetic knee infection (2015), chronic atrial fibrillation (on Eliquis), chronic diastolic CHF with recent admission where he was diagnosed with severe paradoxical LFLG AS who presented to [facility] on 11/15/21 for planned TAVR.   The patient reports having had a heart murmur his whole life. He has been  very healthy and it never caused him any limitation. His cardiac history dates back to 2014 when he was incidentally found to be in new onset atrial fibrillation during a preoperative evaluation for cataract surgery. He was started on metoprolol, but patient refused anticoagulation. He later self discontinued metoprolol. Patient has not followed up with cardiology since then. Per review of CareEverywhere, most recent echo 01/19/14 at [facility] showed EF 65-70%, moderate aortic sclerosis, moderate MR/ mild TR. Patient has not been seeing any medical doctor for the last 5 years.    He was in his usual state of health until 09/2021 when he noticed worsening exercise intolerance, dyspnea on exertion, orthopnea and PND. He ended up being admitted for acute CHF and diagnosed with severe LFLG AS. Echo 11/02/21 suggested severe paradoxical low flow, low gradient aortic stenosis: mean gradient 22 mm hg, peak 32.8 mmhg, AVA 0.83 cm2, DVI 24, SVI 22. EF 55-60%, normal LV wall motion, mild LV hypertrophy, G2DD, mild LAE, mild MR. There is moderate dilatation of the ascending aorta, measuring 44 mm. He underwent pre TAVR work up while admitted. L/RHC showed mild to  moderate nonobstructive coronary artery disease with moderate ostial RCA stenosis, mild to moderate mid RCA stenosis, and an otherwise patent right coronary artery, patent left main with mild plaquing, patent LAD with mild mid vessel stenosis of 40%, patent left circumflex with mild luminal irregularity. With regards to his afib, he was started on Eliquis.   The patient has been evaluated by the multidisciplinary valve team and felt to have severe, symptomatic aortic stenosis and to be a suitable candidate for TAVR, which was set up for 11/15/21.   Hospital Course     Consultants: none   Severe AS: s/p successful TAVR with a 26 mm Edwards Sapien 3 Ultra Resilia THV via the left TF approach on 11/15/21. Initially we attempted right TF access but due to device  malfunction the valve became caught in the right external iliac artery. Dr. Unk Lightning with [facility] was called in and he underwent right Iliofemoral endarterectomy with bovine pericardial patch. Post operative echo completed but pending formal read. Groin sites are stable. ECG with chronic afib and no high grade heart block. Resume home Eliquis tonight.   Acute on chronic diastolic CHF: patient recently admitted for acute CHF. Pre op labs showed a mildly elevated BNP. He has been treated with TAVR and will resume his Lasix.   HTN: Bp well controlled. No changes made  Chronic afib: HR a little elevated. Resume home Lopressor and Eliquis tonight.   _____________  Discharge Vitals Blood pressure 125/75, pulse 90, temperature 97.6 F (36.4 C), temperature source Oral, resp. rate 20, height 6\' 5"  (1.956 m), weight 126.3 kg, SpO2 99 %.  Filed Weights   11/15/21 0725 11/15/21 1630 11/16/21 0441  Weight: 126.1 kg 126.3 kg 126.3 kg    Labs & Radiologic Studies    CBC Recent Labs    11/14/21 0851 11/15/21 1417 11/16/21 0220  WBC 6.6  --  8.9  HGB 13.8 11.6* 11.5*  HCT 41.2 34.0* 35.1*  MCV 95.2  --  95.9  PLT 278  --  [id number]   Basic Metabolic Panel Recent Labs    11/14/21 0851 11/15/21 1417 11/16/21 0220  NA 134* 139 136  K 4.2 4.2 4.1  CL 102 103 104  CO2 21*  --  23  GLUCOSE 113* 148* 99  BUN 16 16 15   CREATININE 0.88 0.70 0.82  CALCIUM 9.2  --  8.3*  MG  --   --  2.0   Liver Function Tests Recent Labs    11/14/21 0851  AST 33  ALT 23  ALKPHOS 96  BILITOT 1.5*  PROT 7.0  ALBUMIN 3.5   No results for input(s): LIPASE, AMYLASE in the last 72 hours. Cardiac Enzymes No results for input(s): CKTOTAL, CKMB, CKMBINDEX, TROPONINI in the last 72 hours. BNP Invalid input(s): POCBNP D-Dimer No results for input(s): DDIMER in the last 72 hours. Hemoglobin A1C No results for input(s): HGBA1C in the last 72 hours. Fasting Lipid Panel No results for input(s): CHOL, HDL, LDLCALC,  TRIG, CHOLHDL, LDLDIRECT in the last 72 hours. Thyroid Function Tests No results for input(s): TSH, T4TOTAL, T3FREE, THYROIDAB in the last 72 hours.  Invalid input(s): FREET3 _____________  DG Chest 2 View  Result Date: 11/14/2021 CLINICAL DATA:  Preadmit for TAVR.  Shortness of breath. EXAM: CHEST - 2 VIEW COMPARISON:  CT chest 11/05/2021 and chest radiograph 11/01/2021. FINDINGS: Trachea is midline. Heart size stable. Minimal linear scarring in the left lower lobe. Lungs are otherwise clear. No pleural fluid. Degenerative changes in the  shoulders. IMPRESSION: No acute findings. Electronically Signed   By: Lorin Picket M.D.   On: 11/14/2021 10:58   CARDIAC CATHETERIZATION  Result Date: 11/04/2021   Mid RCA lesion is 50% stenosed.   Mid LAD lesion is 40% stenosed. 1.  Mild to moderate nonobstructive coronary artery disease with moderate ostial RCA stenosis, mild to moderate mid RCA stenosis, and an otherwise patent right coronary artery. 2.  Patent left main with mild plaquing 3.  Patent LAD with mild mid vessel stenosis of 40% 4.  Patent left circumflex with mild luminal irregularity. 5.  Calcified, restricted aortic valve with peak to peak gradient 24 mmHg 6.  Elevated intracardiac filling pressures with mean wedge pressure of 27, mean PA pressure of 48, and right atrial mean pressure of 16. Recommend: Continued TAVR evaluation, continued IV diuresis for treatment of heart failure, okay to start apixaban tomorrow morning.   CT CORONARY MORPH W/CTA COR W/SCORE W/CA W/CM &/OR WO/CM  Addendum Date: 11/07/2021   ADDENDUM REPORT: 11/07/2021 08:32 ADDENDUM: Extracardiac findings are described separately under dictation for contemporaneously obtained CTA chest, abdomen and pelvis dated 11/05/2021. Please see that dictation for full description of relevant extracardiac findings. Electronically Signed   By: Vinnie Langton M.D.   On: 11/07/2021 08:32   Result Date: 11/07/2021 CLINICAL DATA:  Severe  Aortic Stenosis. EXAM: Cardiac TAVR CT TECHNIQUE: A non-contrast, gated CT scan was obtained with axial slices of 3 mm through the heart for aortic valve calcium scoring. A 120 kV retrospective, gated, contrast cardiac scan was obtained. Gantry rotation speed was 250 msecs and collimation was 0.6 mm. Nitroglycerin was not given. The 3D data set was reconstructed in 5% intervals of the 0-95% of the R-R cycle. Systolic and diastolic phases were analyzed on a dedicated workstation using MPR, MIP, and VRT modes. The patient received 100 cc of contrast. FINDINGS: Image quality: Excellent. Noise artifact is: Limited. Valve Morphology: The aortic valve sis tricuspid. The leaflets are severely calcified with bulky calcification of the [place] that extends to the annulus. There is acquired fusion of the NCC/LCC. Aortic Valve Calcium score: 2247 Aortic annular dimension: Phase assessed: 35% Annular area: 511 mm2 Annular perimeter: 81.2 mm Max diameter: 27.8 mm Min diameter: 23.5 mm Annular and subannular calcification: A single, moderate, protruding calcification is noted under the [place]. Membranous septum length: 13.1 mm. Poor quality study for this measurement. Optimal coplanar projection: LAO 5 CAU 0 Coronary Artery Height above Annulus: Left Main: 15.9 mm Right Coronary: 21.7 mm Sinus of Valsalva Measurements: Non-coronary: 37.5 mm Right-coronary: 34.4 mm Left-coronary: 35.0 mm Sinus of Valsalva Height: Non-coronary: 25.7 mm Right-coronary: 26.5 mm Left-coronary: 23.4 mm Sinotubular Junction: 35 mm Ascending Thoracic Aorta: 42 mm Coronary Arteries: Normal coronary origin. Right dominance. The study was performed without use of NTG and is insufficient for plaque evaluation. Please refer to recent cardiac catheterization for coronary assessment. 3-vessel coronary calcifications. Cardiac Morphology: Right Atrium: Right atrial size is within normal limits. Right Ventricle: The right ventricular cavity is dilated. Left Atrium: Left  atrial size is normal in size. There is mixing artifact in the distal LAA and cannot exclude thrombus. Left Ventricle: The ventricular cavity size is within normal limits. There are no stigmata of prior infarction. There is no abnormal filling defect. Mildly reduced left ventricular function, LVEF=40%. Global hypokinesis. Pulmonary arteries: Normal in size without proximal filling defect. Pulmonary veins: Normal pulmonary venous drainage. Pericardium: Normal thickness with no significant effusion or calcium present. Mitral Valve: The mitral valve  is normal structure without significant calcification. Extra-cardiac findings: Small right pleural effusion. See attached radiology report for non-cardiac structures. IMPRESSION: 1. Severely calcified tricuspid aortic valve with bulky calcification of the [place]. 2. Annular measurements appropriate for 26 mm S3 (511 m2). 3. A single, moderate, protruding calcification is noted under the [place]. 4. Sufficient coronary to annulus distance. 5. Optimal Fluoroscopic Angle for Delivery: LAO 5 CAU 0 6. Mildly reduced left ventricular function, LVEF=40%. Global hypokinesis. 7. Ascending aortic aneurysm up to 42 mm. 8. There is mixing artifact in the distal LAA and cannot exclude thrombus. [place] T. Audie Box, MD Electronically Signed: By: Eleonore Chiquito M.D. On: 11/06/2021 16:58   DG Chest Port 1 View  Result Date: 11/01/2021 CLINICAL DATA:  Shortness of breath EXAM: PORTABLE CHEST 1 VIEW COMPARISON:  None. FINDINGS: Transverse diameter of heart is increased. Central pulmonary vessels are prominent. Interstitial markings in the parahilar regions and lower lung fields are prominent. There is no focal pulmonary consolidation. Left lateral CP angle is not included in its entirety. There is no pneumothorax. Degenerative changes are noted in the right shoulder. IMPRESSION: Cardiomegaly. There is prominence of interstitial markings in the parahilar regions and lower lung fields suggesting mild  interstitial edema or interstitial pneumonia. There is no focal pulmonary consolidation. There is no pleural effusion. Electronically Signed   By: Elmer Picker M.D.   On: 11/01/2021 12:04   CT ANGIO CHEST AORTA W/CM & OR WO/CM  Result Date: 11/07/2021 CLINICAL DATA:  88 year old male with history of severe aortic stenosis. Preprocedural study prior to potential transcatheter aortic valve replacement (TAVR) procedure. EXAM: CT ANGIOGRAPHY CHEST, ABDOMEN AND PELVIS TECHNIQUE: Multidetector CT imaging through the chest, abdomen and pelvis was performed using the standard protocol during bolus administration of intravenous contrast. Multiplanar reconstructed images and MIPs were obtained and reviewed to evaluate the vascular anatomy. CONTRAST:  134mL OMNIPAQUE IOHEXOL 350 MG/ML SOLN COMPARISON:  No priors. FINDINGS: CTA CHEST FINDINGS Cardiovascular: Heart size is mildly enlarged with left atrial dilatation, but normal left ventricular size. There is no significant pericardial fluid, thickening or pericardial calcification. There is aortic atherosclerosis, as well as atherosclerosis of the great vessels of the mediastinum and the coronary arteries, including calcified atherosclerotic plaque in the left main, left anterior descending, left circumflex and right coronary arteries. Severe thickening and calcification of the aortic valve. Mild calcifications of the mitral annulus. Mediastinum/Lymph Nodes: No pathologically enlarged mediastinal or hilar lymph nodes. Esophagus is unremarkable in appearance. No axillary lymphadenopathy. Lungs/Pleura: Small right and trace left pleural effusions lying dependently. Patchy areas of peribronchovascular predominant ground-glass attenuation are noted in the central aspects of the lungs bilaterally, most severe throughout the mid to upper lungs. No definite suspicious appearing pulmonary nodules or masses are noted. Musculoskeletal/Soft Tissues: There are no aggressive  appearing lytic or blastic lesions noted in the visualized portions of the skeleton. CTA ABDOMEN AND PELVIS FINDINGS Hepatobiliary: No suspicious cystic or solid hepatic lesions. No intra or extrahepatic biliary ductal dilatation. Gallbladder is normal in appearance. Pancreas: No pancreatic mass. No pancreatic ductal dilatation. No pancreatic or peripancreatic fluid collections or inflammatory changes. Spleen: Unremarkable. Adrenals/Urinary Tract: Generalized cortical atrophy in the kidneys bilaterally. No suspicious renal lesions. No hydroureteronephrosis. Bilateral adrenal glands are normal in appearance. 1.9 cm diverticulum in the right-side of the urinary bladder incidentally noted. Urinary bladder is otherwise unremarkable in appearance. Stomach/Bowel: The appearance of the stomach is normal. There is no pathologic dilatation of small bowel or colon. The appendix is not confidently  identified and may be surgically absent. Regardless, there are no inflammatory changes noted adjacent to the cecum to suggest the presence of an acute appendicitis at this time. Vascular/Lymphatic: Vascular findings and measurements pertinent to potential TAVR procedure, as detailed below. No aneurysm or dissection noted in the abdominal or pelvic vasculature. Intermediate attenuation fluid surrounding the right superficial femoral and common femoral arteries, likely residual blood products related to recent vascular access. No lymphadenopathy noted in the abdomen or pelvis. Reproductive: Prostate gland and seminal vesicles are unremarkable in appearance. Other: No significant volume of ascites.  No pneumoperitoneum. Musculoskeletal: There are no aggressive appearing lytic or blastic lesions noted in the visualized portions of the skeleton. VASCULAR MEASUREMENTS PERTINENT TO TAVR: AORTA: Minimal Aortic Diameter-19 x 17 mm Severity of Aortic Calcification-mild RIGHT PELVIS: Right Common Iliac Artery - Minimal Diameter-12.0 x 12.3 mm  Tortuosity-mild Calcification-minimal Right External Iliac Artery - Minimal Diameter-10.3 x 9.5 mm Tortuosity-severe Calcification-none Right Common Femoral Artery - Minimal Diameter-10.4 x 10.7 mm Tortuosity-mild Calcification-minimal LEFT PELVIS: Left Common Iliac Artery - Minimal Diameter-14.1 x 12.7 mm Tortuosity-moderate Calcification-mild Left External Iliac Artery - Minimal Diameter-9.7 x 9.7 mm Tortuosity-severe Calcification-none Left Common Femoral Artery - Minimal Diameter-10.6 x 10.1 mm Tortuosity-mild Calcification-minimal Review of the MIP images confirms the above findings. IMPRESSION: 1. Vascular findings and measurements pertinent to potential TAVR procedure, as detailed above. 2. Severe thickening calcification of the aortic valve, compatible with reported clinical history of severe aortic stenosis. 3. The appearance of the lungs could reflect pulmonary edema. Given the presence of small bilateral pleural effusions and normal left ventricular size, diastolic congestive heart failure should be considered. Alternatively, if there is history of fever and sputum production, multilobar bilateral bronchopneumonia should be considered. 4. Aortic atherosclerosis, in addition to left main and three-vessel coronary artery disease. 5. Additional incidental findings, as above. Electronically Signed   By: Vinnie Langton M.D.   On: 11/07/2021 08:47   ECHOCARDIOGRAM COMPLETE  Result Date: 11/02/2021    ECHOCARDIOGRAM REPORT   Patient Name:   Louis Koch Date of Exam: 11/02/2021 Medical Rec #:  [id number]    Height:       77.0 in Accession #:    [id number]   Weight:       296.7 lb Date of Birth:  03-10-1927    BSA:          2.645 m Patient Age:    62 years     BP:           134/90 mmHg Patient Gender: M            HR:           74 bpm. Exam Location:  Inpatient Procedure: 2D Echo, Cardiac Doppler and Color Doppler Indications:    CHF-Acute Diastolic [id number]  History:        Patient has no prior history of  Echocardiogram examinations.  Sonographer:    Bernadene Person RDCS Referring Phys: [id number] [place]  1. The aortic valve is incompletely visualized on SAX view, however, suspect severe (vs moderate-to-severe) paradoxical low flow, low gradient aortic stenosis with AVA 0.8cm2 by continuity, mean gradient 71mmHg, Vmax 3.9m/s, DI 0.23, SVi 22. Notably the  LVOT VTI may be underestimated due to suboptimal doppler angle.  2. Left ventricular ejection fraction, by estimation, is 55 to 60%. The left ventricle has normal function. The left ventricle has no regional wall motion abnormalities. There is mild asymmetric left ventricular hypertrophy of the basal-septal segment.  Left ventricular diastolic parameters are consistent with Grade II diastolic dysfunction (pseudonormalization).  3. Right ventricular systolic function is mildly reduced. The right ventricular size is normal. There is mildly elevated pulmonary artery systolic pressure.  4. Left atrial size was mildly dilated.  5. The mitral valve is degenerative. Mild mitral valve regurgitation. Moderate mitral annular calcification.  6. The aortic valve is tricuspid. There is moderate calcification of the aortic valve. There is moderate thickening of the aortic valve. Aortic valve regurgitation is mild. Severe aortic valve stenosis.  7. Aortic dilatation noted. There is moderate dilatation of the ascending aorta, measuring 44 mm.  8. The inferior vena cava is dilated in size with <50% respiratory variability, suggesting right atrial pressure of 15 mmHg. Comparison(s): No prior Echocardiogram. FINDINGS  Left Ventricle: Left ventricular ejection fraction, by estimation, is 55 to 60%. The left ventricle has normal function. The left ventricle has no regional wall motion abnormalities. The left ventricular internal cavity size was normal in size. There is  mild asymmetric left ventricular hypertrophy of the basal-septal segment. Left ventricular diastolic  parameters are consistent with Grade II diastolic dysfunction (pseudonormalization). Right Ventricle: The right ventricular size is normal. Right vetricular wall thickness was not well visualized. Right ventricular systolic function is mildly reduced. There is mildly elevated pulmonary artery systolic pressure. The tricuspid regurgitant velocity is 2.69 m/s, and with an assumed right atrial pressure of 15 mmHg, the estimated right ventricular systolic pressure is [id number] mmHg. Left Atrium: Left atrial size was mildly dilated. Right Atrium: Right atrial size was normal in size. Pericardium: There is no evidence of pericardial effusion. Mitral Valve: The mitral valve is degenerative in appearance. There is mild thickening of the mitral valve leaflet(s). There is mild calcification of the mitral valve leaflet(s). Moderate mitral annular calcification. Mild mitral valve regurgitation. Tricuspid Valve: The tricuspid valve is normal in structure. Tricuspid valve regurgitation is mild. Aortic Valve: The aortic valve is tricuspid. There is moderate calcification of the aortic valve. There is moderate thickening of the aortic valve. Aortic valve regurgitation is mild. Aortic regurgitation PHT measures 500 msec. Severe aortic stenosis is present. Aortic valve mean gradient measures 22.0 mmHg. Aortic valve peak gradient measures 32.8 mmHg. Aortic valve area, by VTI measures 0.83 cm. Pulmonic Valve: The pulmonic valve was not well visualized. Pulmonic valve regurgitation is trivial. Aorta: The aortic root is normal in size and structure and aortic dilatation noted. There is moderate dilatation of the ascending aorta, measuring 44 mm. Venous: The inferior vena cava is dilated in size with less than 50% respiratory variability, suggesting right atrial pressure of 15 mmHg. IAS/Shunts: The atrial septum is grossly normal.  LEFT VENTRICLE PLAX 2D LVIDd:         4.70 cm      Diastology LVIDs:         3.10 cm      LV e' medial:    4.60  cm/s LV PW:         1.00 cm      LV E/e' medial:  33.0 LV IVS:        0.90 cm      LV e' lateral:   6.05 cm/s LVOT diam:     2.10 cm      LV E/e' lateral: 25.1 LV SV:         58 LV SV Index:   22 LVOT Area:     3.46 cm  LV Volumes (MOD) LV vol d, MOD A2C: 68.4 ml  LV vol d, MOD A4C: 127.0 ml LV vol s, MOD A2C: 29.4 ml LV vol s, MOD A4C: 55.2 ml LV SV MOD A2C:     39.0 ml LV SV MOD A4C:     127.0 ml LV SV MOD BP:      53.0 ml RIGHT VENTRICLE RV S prime:     7.76 cm/s TAPSE (M-mode): 1.7 cm LEFT ATRIUM             Index        RIGHT ATRIUM           Index LA diam:        4.40 cm 1.66 cm/m   RA Area:     17.70 cm LA Vol (A2C):   87.4 ml 33.04 ml/m  RA Volume:   45.00 ml  17.01 ml/m LA Vol (A4C):   95.4 ml 36.07 ml/m LA Biplane Vol: 93.0 ml 35.16 ml/m  AORTIC VALVE AV Area (Vmax):    0.93 cm AV Area (Vmean):   0.83 cm AV Area (VTI):     0.83 cm AV Vmax:           286.33 cm/s AV Vmean:          222.667 cm/s AV VTI:            0.702 m AV Peak Grad:      32.8 mmHg AV Mean Grad:      22.0 mmHg LVOT Vmax:         77.17 cm/s LVOT Vmean:        53.333 cm/s LVOT VTI:          0.168 m LVOT/AV VTI ratio: 0.24 AI PHT:            500 msec  AORTA Ao Root diam: 3.50 cm Ao Asc diam:  4.40 cm MITRAL VALVE                TRICUSPID VALVE MV Area (PHT): 3.77 cm     TR Peak grad:   28.9 mmHg MV Decel Time: 201 msec     TR Vmax:        269.00 cm/s MR Peak grad: 112.8 mmHg MR Mean grad: 73.0 mmHg     SHUNTS MR Vmax:      531.00 cm/s   Systemic VTI:  0.17 m MR Vmean:     411.0 cm/s    Systemic Diam: 2.10 cm MV E velocity: 152.00 cm/s MV A velocity: 85.00 cm/s MV E/A ratio:  1.79 Laurance Flatten MD Electronically signed by Laurance Flatten MD Signature Date/Time: 11/02/2021/4:50:05 PM    Final    ECHOCARDIOGRAM LIMITED  Result Date: 11/15/2021    ECHOCARDIOGRAM LIMITED REPORT   Patient Name:   Louis Koch Date of Exam: 11/15/2021 Medical Rec #:  [id number]    Height:       77.0 in Accession #:    [id number]   Weight:       278.0  lb Date of Birth:  1927/03/19    BSA:          2.573 m Patient Age:    89 years     BP:           142/105 mmHg Patient Gender: M            HR:           79 bpm. Exam Location:  Inpatient Procedure: Limited Echo, Cardiac Doppler and Color Doppler Indications:    Aortic  stenosis I35.0  History:        Patient has prior history of Echocardiogram examinations, most                 recent 11/02/2021. CHF, Aortic Valve Disease, Arrythmias:Atrial                 Fibrillation; Signs/Symptoms:Murmur and Dyspnea.                 Aortic Valve: 26 mm Sapien prosthetic, stented (TAVR) valve is                 present in the aortic position. Procedure Date: 11/15/2021.  Sonographer:    Darlina Sicilian RDCS Referring Phys: [place]  1. Echo guided TAVR procedure. 26 mm S3 present in the aortic position. V max 1.0 m/s, MG 2.0 mmHG, EOA 2.80 cm2, DI 0.53. Mild paravalvular leak noted in the 5-7 o'clock position. Device well seated. No immediate complications. The aortic valve has been repaired/replaced. Aortic valve regurgitation is mild. 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 Procedure Date: 11/15/2021.  2. Left ventricular ejection fraction, by estimation, is 45 to 50%. The left ventricle has mildly decreased function. The left ventricle demonstrates global hypokinesis.  3. Right ventricular systolic function is normal. The right ventricular size is normal.  4. The mitral valve is degenerative. Mild mitral valve regurgitation. No evidence of mitral stenosis. FINDINGS  Left Ventricle: Left ventricular ejection fraction, by estimation, is 45 to 50%. The left ventricle has mildly decreased function. The left ventricle demonstrates global hypokinesis. Right Ventricle: The right ventricular size is normal. No increase in right ventricular wall thickness. Right ventricular systolic function is normal. Mitral Valve: The mitral valve is degenerative in appearance. Mild  mitral valve regurgitation. No evidence of mitral valve stenosis. MV peak gradient, 9.2 mmHg. The mean mitral valve gradient is 3.0 mmHg. Aortic Valve: Echo guided TAVR procedure. 26 mm S3 present in the aortic position. V max 1.0 m/s, MG 2.0 mmHG, EOA 2.80 cm2, DI 0.53. Mild paravalvular leak noted in the 5-7 o'clock position. Device well seated. No immediate complications. The aortic valve has been repaired/replaced. Aortic valve regurgitation is mild. Aortic valve mean gradient measures 2.0 mmHg. Aortic valve peak gradient measures 3.7 mmHg. Aortic valve area, by VTI measures 2.80 cm. There is a 26 mm Sapien prosthetic, stented (TAVR) valve present in the aortic position. Procedure Date: 11/15/2021. Pulmonic Valve: The pulmonic valve was grossly normal. Pulmonic valve regurgitation is not visualized. No evidence of pulmonic stenosis. LEFT VENTRICLE PLAX 2D LVOT diam:     2.60 cm LV SV:         72 LV SV Index:   28 LVOT Area:     5.31 cm  AORTIC VALVE AV Area (Vmax):    3.82 cm AV Area (Vmean):   1.55 cm AV Area (VTI):     2.80 cm AV Vmax:           96.10 cm/s AV Vmean:          147.300 cm/s AV VTI:            0.257 m AV Peak Grad:      3.7 mmHg AV Mean Grad:      2.0 mmHg LVOT Vmax:         69.10 cm/s LVOT Vmean:        42.950 cm/s LVOT VTI:  0.136 m LVOT/AV VTI ratio: 0.53 MITRAL VALVE MV Area VTI:  1.56 cm   SHUNTS MV Peak grad: 9.2 mmHg   Systemic VTI:  0.14 m MV Mean grad: 3.0 mmHg   Systemic Diam: 2.60 cm MV Vmax:      1.52 m/s MV Vmean:     83.0 cm/s Eleonore Chiquito MD Electronically signed by Eleonore Chiquito MD Signature Date/Time: 11/15/2021/3:38:54 PM    Final    Structural Heart Procedure  Result Date: 11/15/2021 See surgical note for result.  CT Angio Abd/Pel w/ and/or w/o  Result Date: 11/07/2021 CLINICAL DATA:  88 year old male with history of severe aortic stenosis. Preprocedural study prior to potential transcatheter aortic valve replacement (TAVR) procedure. EXAM: CT ANGIOGRAPHY  CHEST, ABDOMEN AND PELVIS TECHNIQUE: Multidetector CT imaging through the chest, abdomen and pelvis was performed using the standard protocol during bolus administration of intravenous contrast. Multiplanar reconstructed images and MIPs were obtained and reviewed to evaluate the vascular anatomy. CONTRAST:  190mL OMNIPAQUE IOHEXOL 350 MG/ML SOLN COMPARISON:  No priors. FINDINGS: CTA CHEST FINDINGS Cardiovascular: Heart size is mildly enlarged with left atrial dilatation, but normal left ventricular size. There is no significant pericardial fluid, thickening or pericardial calcification. There is aortic atherosclerosis, as well as atherosclerosis of the great vessels of the mediastinum and the coronary arteries, including calcified atherosclerotic plaque in the left main, left anterior descending, left circumflex and right coronary arteries. Severe thickening and calcification of the aortic valve. Mild calcifications of the mitral annulus. Mediastinum/Lymph Nodes: No pathologically enlarged mediastinal or hilar lymph nodes. Esophagus is unremarkable in appearance. No axillary lymphadenopathy. Lungs/Pleura: Small right and trace left pleural effusions lying dependently. Patchy areas of peribronchovascular predominant ground-glass attenuation are noted in the central aspects of the lungs bilaterally, most severe throughout the mid to upper lungs. No definite suspicious appearing pulmonary nodules or masses are noted. Musculoskeletal/Soft Tissues: There are no aggressive appearing lytic or blastic lesions noted in the visualized portions of the skeleton. CTA ABDOMEN AND PELVIS FINDINGS Hepatobiliary: No suspicious cystic or solid hepatic lesions. No intra or extrahepatic biliary ductal dilatation. Gallbladder is normal in appearance. Pancreas: No pancreatic mass. No pancreatic ductal dilatation. No pancreatic or peripancreatic fluid collections or inflammatory changes. Spleen: Unremarkable. Adrenals/Urinary Tract:  Generalized cortical atrophy in the kidneys bilaterally. No suspicious renal lesions. No hydroureteronephrosis. Bilateral adrenal glands are normal in appearance. 1.9 cm diverticulum in the right-side of the urinary bladder incidentally noted. Urinary bladder is otherwise unremarkable in appearance. Stomach/Bowel: The appearance of the stomach is normal. There is no pathologic dilatation of small bowel or colon. The appendix is not confidently identified and may be surgically absent. Regardless, there are no inflammatory changes noted adjacent to the cecum to suggest the presence of an acute appendicitis at this time. Vascular/Lymphatic: Vascular findings and measurements pertinent to potential TAVR procedure, as detailed below. No aneurysm or dissection noted in the abdominal or pelvic vasculature. Intermediate attenuation fluid surrounding the right superficial femoral and common femoral arteries, likely residual blood products related to recent vascular access. No lymphadenopathy noted in the abdomen or pelvis. Reproductive: Prostate gland and seminal vesicles are unremarkable in appearance. Other: No significant volume of ascites.  No pneumoperitoneum. Musculoskeletal: There are no aggressive appearing lytic or blastic lesions noted in the visualized portions of the skeleton. VASCULAR MEASUREMENTS PERTINENT TO TAVR: AORTA: Minimal Aortic Diameter-19 x 17 mm Severity of Aortic Calcification-mild RIGHT PELVIS: Right Common Iliac Artery - Minimal Diameter-12.0 x 12.3 mm Tortuosity-mild Calcification-minimal Right External Iliac Artery -  Minimal Diameter-10.3 x 9.5 mm Tortuosity-severe Calcification-none Right Common Femoral Artery - Minimal Diameter-10.4 x 10.7 mm Tortuosity-mild Calcification-minimal LEFT PELVIS: Left Common Iliac Artery - Minimal Diameter-14.1 x 12.7 mm Tortuosity-moderate Calcification-mild Left External Iliac Artery - Minimal Diameter-9.7 x 9.7 mm Tortuosity-severe Calcification-none Left  Common Femoral Artery - Minimal Diameter-10.6 x 10.1 mm Tortuosity-mild Calcification-minimal Review of the MIP images confirms the above findings. IMPRESSION: 1. Vascular findings and measurements pertinent to potential TAVR procedure, as detailed above. 2. Severe thickening calcification of the aortic valve, compatible with reported clinical history of severe aortic stenosis. 3. The appearance of the lungs could reflect pulmonary edema. Given the presence of small bilateral pleural effusions and normal left ventricular size, diastolic congestive heart failure should be considered. Alternatively, if there is history of fever and sputum production, multilobar bilateral bronchopneumonia should be considered. 4. Aortic atherosclerosis, in addition to left main and three-vessel coronary artery disease. 5. Additional incidental findings, as above. Electronically Signed   By: Vinnie Langton M.D.   On: 11/07/2021 08:47   Disposition   Pt is being discharged home today in good condition.  Follow-up Plans & Appointments     Follow-up Information     Eileen Stanford, PA-C. Go on 11/23/2021.   Specialties: Cardiology, Radiology Why: @ 2:30pm, please arrive at least 10 minutes early Contact information: [place] [address] [phone]         [facility] Follow up in 3 week(s).   Specialty: Vascular Surgery Why: Office will call you to arrange your appt (sent) Contact information: [place] [phone]                 Discharge Medications   Allergies as of 11/16/2021   No Known Allergies      Medication List     TAKE these medications    B-12 PO Take 1 tablet by mouth daily.   D3 PO Take 1 capsule by mouth daily.   Eliquis 5 MG Tabs tablet Generic drug: apixaban Take 1 tablet (5 mg total) by mouth 2 (two) times daily.   furosemide 40 MG tablet Commonly known  as: LASIX Take 1 tablet (40 mg total) by mouth daily.   metoprolol tartrate 25 MG tablet Commonly known as: LOPRESSOR Take 1/2 tablet (12.5 mg total) by mouth 2 (two) times daily.   multivitamin with minerals Tabs tablet Take 1 tablet by mouth daily.   potassium chloride SA 20 MEQ tablet Commonly known as: KLOR-CON M Take 1 tablet (20 mEq total) by mouth daily.   Zinc 30 MG Tabs Take 30 mg by mouth daily.            Outstanding Labs/Studies   none  Duration of Discharge Encounter   Greater than 30 minutes including physician time.  SignedAngelena Form, PA-C 11/16/2021, 9:34 AM [phone]

## 2021-11-16 NOTE — Progress Notes (Signed)
Pt to be discharged from 4E to home with girlfriend. Tele d/c'd. IV removed. Discharge information and medication education provided.  Raelyn Number, RN

## 2021-11-16 NOTE — Progress Notes (Signed)
1 Day Post-Op Procedure(s) (LRB): TRANSCATHETER AORTIC VALVE REPLACEMENT, TRANSFEMORAL (Right) INTRAOPERATIVE TRANSTHORACIC ECHOCARDIOGRAM (N/A) Subjective: He feels well this am and anxious to go home. Ambulated.  Objective: Vital signs in last 24 hours: Temp:  [96.3 F (35.7 C)-98.5 F (36.9 C)] 97.6 F (36.4 C) (01/18 0441) Pulse Rate:  [0-99] 95 (01/18 0441) Cardiac Rhythm: Atrial fibrillation (01/17 2050) Resp:  [0-23] 18 (01/18 0441) BP: (70-158)/(38-90) 113/75 (01/18 0441) SpO2:  [95 %-100 %] 99 % (01/18 0441) Arterial Line BP: (73-156)/(43-76) 100/47 (01/17 1525) Weight:  [126.3 kg] 126.3 kg (01/18 0441)  Hemodynamic parameters for last 24 hours:    Intake/Output from previous day: 01/17 0701 - 01/18 0700 In: 2694.1 [P.O.:240; I.V.:2099.1; IV Piggyback:355.1] Out: 2390 [Urine:1390; Blood:1000] Intake/Output this shift: No intake/output data recorded.  General appearance: alert and cooperative Neurologic: intact Heart: irregularly irregular rhythm Lungs: clear to auscultation bilaterally Abdomen: soft, non-tender; bowel sounds normal; no masses,  no organomegaly Extremities: extremities normal, warm, no edema Wound: right groin incision looks good. Left groin and left radial site ok  Lab Results: Recent Labs    11/14/21 0851 11/15/21 1417 11/16/21 0220  WBC 6.6  --  8.9  HGB 13.8 11.6* 11.5*  HCT 41.2 34.0* 35.1*  PLT 278  --  174   BMET:  Recent Labs    11/14/21 0851 11/15/21 1417 11/16/21 0220  NA 134* 139 136  K 4.2 4.2 4.1  CL 102 103 104  CO2 21*  --  23  GLUCOSE 113* 148* 99  BUN 16 16 15   CREATININE 0.88 0.70 0.82  CALCIUM 9.2  --  8.3*    PT/INR:  Recent Labs    11/14/21 0851  LABPROT 12.8  INR 1.0   ABG    Component Value Date/Time   PHART 7.461 (H) 11/14/2021 0927   HCO3 22.2 11/14/2021 0927   TCO2 27 11/15/2021 1417   ACIDBASEDEF 1.1 11/14/2021 0927   O2SAT 98.6 11/14/2021 0927   CBG (last 3)  No results for input(s):  GLUCAP in the last 72 hours.  Assessment/Plan: S/P Procedure(s) (LRB): TRANSCATHETER AORTIC VALVE REPLACEMENT, TRANSFEMORAL (Right) INTRAOPERATIVE TRANSTHORACIC ECHOCARDIOGRAM (N/A)  POD 1 He has been hemodynamically stable in persistent Afib. ECG ok this am.  Labs ok  He has minimal pain and wants to go home.  Will do 2D echo this am and ambulate again. I think he can go home with his significant other later this am.   Will resume Eliquis for atrial fib at discharge. Would not use ASA with it.   LOS: 1 day    Gaye Pollack 11/16/2021

## 2021-11-16 NOTE — Progress Notes (Addendum)
°  Progress Note    11/16/2021 7:19 AM 1 Day Post-Op  Subjective:  no complaints; denies pain in his right foot.  afebrile  Vitals:   11/15/21 2338 11/16/21 0441  BP: (!) 142/73 113/75  Pulse: 75 95  Resp: 20 18  Temp: 97.6 F (36.4 C) 97.6 F (36.4 C)  SpO2: 100% 99%    Physical Exam: Cardiac:  irregular Lungs:  non labored Incisions:  clean and dry Extremities:  brisk doppler signals right DP/PT   CBC    Component Value Date/Time   WBC 8.9 11/16/2021 0220   RBC 3.66 (L) 11/16/2021 0220   HGB 11.5 (L) 11/16/2021 0220   HCT 35.1 (L) 11/16/2021 0220   PLT 174 11/16/2021 0220   MCV 95.9 11/16/2021 0220   MCH 31.4 11/16/2021 0220   MCHC 32.8 11/16/2021 0220   RDW 14.0 11/16/2021 0220    BMET    Component Value Date/Time   NA 136 11/16/2021 0220   K 4.1 11/16/2021 0220   CL 104 11/16/2021 0220   CO2 23 11/16/2021 0220   GLUCOSE 99 11/16/2021 0220   BUN 15 11/16/2021 0220   CREATININE 0.82 11/16/2021 0220   CALCIUM 8.3 (L) 11/16/2021 0220   GFRNONAA >60 11/16/2021 0220    INR    Component Value Date/Time   INR 1.0 11/14/2021 0851     Intake/Output Summary (Last 24 hours) at 11/16/2021 0719 Last data filed at 11/16/2021 0400 Gross per 24 hour  Intake 2694.14 ml  Output 2390 ml  Net 304.14 ml     Assessment/Plan:  88 y.o. male is s/p:  Right iliofemoral exposure Iliofemoral endarterectomy, bovine pericardial patch  TAVR 1 Day Post-Op   -pt with brisk doppler signals right DP/PT -incision looks fine-discussed with pt and his friend that the only time the incision should be wet is in the shower, otherwise, keep gauze in place to wick moisture.  Does not need to be taped-just replace when pt has been up and about -will have pt f/u with [facility] in 3-4 weeks for incision check and ABI.  Our office will arrange appt.    Doreatha Massed, PA-C [facility] [phone] 11/16/2021 7:19 AM  VASCULAR STAFF ADDENDUM: I have  independently interviewed and examined the patient. I agree with the above.  Groin site looks excellent. No pain. Pulse in the foot.  Will follow in clinic to ensue adequate healing.  I discussed the above with Fayrene Fearing. He was asked to call our office immediately should new redness, or drainage occur.   Fara Olden, MD [facility] Phone Number: [phone] 11/16/2021 8:41 AM

## 2021-11-17 ENCOUNTER — Telehealth (HOSPITAL_COMMUNITY): Payer: Self-pay | Admitting: Pharmacist

## 2021-11-17 ENCOUNTER — Telehealth: Payer: Self-pay | Admitting: Physician Assistant

## 2021-11-17 ENCOUNTER — Other Ambulatory Visit (HOSPITAL_COMMUNITY): Payer: Self-pay

## 2021-11-17 NOTE — Telephone Encounter (Signed)
°  [facility]   °MULTIDISCIPLINARY HEART VALVE TEAM ° ° °Patient contacted regarding discharge from [facility] on 1/18 ° °Patient understands to follow up with a structural heart APP on 1/25 at [address]  °Patient understands discharge instructions? yes °Patient understands medications and regimen? yes °Patient understands to bring all medications to this visit? yes ° °Murray Durrell PA-C  [facility]  ° ° ° °

## 2021-11-17 NOTE — Telephone Encounter (Signed)
Pharmacy Transitions of Care Follow-up Telephone Call  Date of discharge: 11/07/21 Discharge Diagnosis: a.fib (eliquis indication)  Spoke with Rosie Fate, significant other, who helps patient manage medications.  How have you been since you were released from the hospital? Patient was discharged on eliquis then asked to hold eliquis prior to a subsequent admission for valve replacement.  He is now home and has resumed Eliquis.  He is doing well but is experiencing some fatigue and dizziness since starting metoprolol.  Patient will purchase a home blood pressure monitor and report results to cardiology.    Medication changes made at discharge:  - START: Eliquis, metoprolol, furosemide, potassium  - STOPPED: ASA  - CHANGED: n/a  Medication changes verified by the patient? Yes    Medication Accessibility:  Home Pharmacy: Hasnt had one, wants to use [facility] pharmacies   Was the patient provided with refills on discharged medications? no   Have all prescriptions been transferred from [facility] to home pharmacy? No - wants to use [facility]   Is the patient able to afford medications? No insurance Notable copays: Eliquis Eligible patient assistance: MAP    Medication Review:   APIXABAN (ELIQUIS)  Apixaban 5 mg BID initiated on 11/07/21.  - Discussed importance of taking medication around the same time everyday  - Reviewed potential DDIs with patient  - Advised patient of medications to avoid (NSAIDs, ASA)  - Educated that Tylenol (acetaminophen) will be the preferred analgesic to prevent risk of bleeding  - Emphasized importance of monitoring for signs and symptoms of bleeding (abnormal bruising, prolonged bleeding, nose bleeds, bleeding from gums, discolored urine, black tarry stools)  - Advised patient to alert all providers of anticoagulation therapy prior to starting a new medication or having a procedure     Follow-up Appointments:  PCP Hospital f/u appt  confirmed? No PCP at this time   [facility] f/u appt confirmed?  Scheduled to see cardiology on 11/23/21.   If their condition worsens, is the pt aware to call PCP or go to the Emergency Dept.? yes  Final Patient Assessment: Patient is doing well overall and has resumed home medications after being discharged from valve replacement surgery.  Pts significant other is caring for him and had several questions regarding his new meds.  We have discussed bleeding risk, avoiding NSAIDS and ASA.  We have discussed importance of checking BP at home in response to dizzy episodes.  Reviewed pharmacy options for obtaining refills.

## 2021-11-18 ENCOUNTER — Telehealth: Payer: Self-pay | Admitting: Physician Assistant

## 2021-11-18 NOTE — Telephone Encounter (Signed)
Louis Koch called to report patient's weight gain in one day of 3.2lbs.  She doesn't know if has any swelling going on. She would like to speak to Cline Crock about this.

## 2021-11-21 ENCOUNTER — Encounter: Payer: Medicare Other | Admitting: Surgery

## 2021-11-21 LAB — POCT I-STAT, CHEM 8
BUN: 17 mg/dL (ref 8–23)
Calcium, Ion: 1.22 mmol/L (ref 1.15–1.40)
Chloride: 103 mmol/L (ref 98–111)
Creatinine, Ser: 0.7 mg/dL (ref 0.61–1.24)
Glucose, Bld: 122 mg/dL — ABNORMAL HIGH (ref 70–99)
HCT: 39 % (ref 39.0–52.0)
Hemoglobin: 13.3 g/dL (ref 13.0–17.0)
Potassium: 4.1 mmol/L (ref 3.5–5.1)
Sodium: 138 mmol/L (ref 135–145)
TCO2: 24 mmol/L (ref 22–32)

## 2021-11-21 LAB — POCT I-STAT 7, (LYTES, BLD GAS, ICA,H+H)
Acid-Base Excess: 2 mmol/L (ref 0.0–2.0)
Acid-Base Excess: 0 mmol/L (ref 0.0–2.0)
Bicarbonate: 29.4 mmol/L — ABNORMAL HIGH (ref 20.0–28.0)
Bicarbonate: 25.1 mmol/L (ref 20.0–28.0)
Calcium, Ion: 1.22 mmol/L (ref 1.15–1.40)
Calcium, Ion: 1.21 mmol/L (ref 1.15–1.40)
HCT: 35 % — ABNORMAL LOW (ref 39.0–52.0)
HCT: 34 % — ABNORMAL LOW (ref 39.0–52.0)
Hemoglobin: 11.9 g/dL — ABNORMAL LOW (ref 13.0–17.0)
Hemoglobin: 11.6 g/dL — ABNORMAL LOW (ref 13.0–17.0)
O2 Saturation: 100 %
O2 Saturation: 98 %
Potassium: 4.9 mmol/L (ref 3.5–5.1)
Potassium: 5.1 mmol/L (ref 3.5–5.1)
Sodium: 137 mmol/L (ref 135–145)
Sodium: 136 mmol/L (ref 135–145)
TCO2: 31 mmol/L (ref 22–32)
TCO2: 26 mmol/L (ref 22–32)
pCO2 arterial: 56 mmHg — ABNORMAL HIGH (ref 32.0–48.0)
pCO2 arterial: 39.7 mmHg (ref 32.0–48.0)
pH, Arterial: 7.328 — ABNORMAL LOW (ref 7.350–7.450)
pH, Arterial: 7.409 (ref 7.350–7.450)
pO2, Arterial: 436 mmHg — ABNORMAL HIGH (ref 83.0–108.0)
pO2, Arterial: 107 mmHg (ref 83.0–108.0)

## 2021-11-21 LAB — POCT ACTIVATED CLOTTING TIME
Activated Clotting Time: 151 seconds
Activated Clotting Time: 341 seconds
Activated Clotting Time: 313 seconds
Activated Clotting Time: 132 seconds
Activated Clotting Time: 246 seconds
Activated Clotting Time: 272 seconds

## 2021-11-22 NOTE — Progress Notes (Signed)
Thanks nothing to do.

## 2021-11-23 ENCOUNTER — Encounter: Payer: Self-pay | Admitting: Physician Assistant

## 2021-11-23 ENCOUNTER — Other Ambulatory Visit: Payer: Self-pay

## 2021-11-23 ENCOUNTER — Ambulatory Visit (INDEPENDENT_AMBULATORY_CARE_PROVIDER_SITE_OTHER): Payer: Medicare Other | Admitting: Physician Assistant

## 2021-11-23 VITALS — BP 102/72 | HR 64 | Ht 77.0 in | Wt 281.0 lb

## 2021-11-23 DIAGNOSIS — I5032 Chronic diastolic (congestive) heart failure: Secondary | ICD-10-CM | POA: Diagnosis not present

## 2021-11-23 DIAGNOSIS — Z952 Presence of prosthetic heart valve: Secondary | ICD-10-CM

## 2021-11-23 DIAGNOSIS — I482 Chronic atrial fibrillation, unspecified: Secondary | ICD-10-CM | POA: Diagnosis not present

## 2021-11-23 DIAGNOSIS — I10 Essential (primary) hypertension: Secondary | ICD-10-CM

## 2021-11-23 MED ORDER — AMOXICILLIN 500 MG PO TABS: ORAL_TABLET | ORAL | 6 refills | Status: AC

## 2021-11-23 NOTE — Progress Notes (Signed)
[facility] [place]                                     Cardiology Office Note:    Date:  11/23/2021   ID:  Louis Koch, DOB 1926-10-31, MRN [id number]  PCP:  Pcp, No  [facility] Cardiologist:  Buford Dresser, MD / Dr. Angelena Form & Dr. Cyndia Bent (TAVR) [place] Electrophysiologist:  None   Referring MD: No ref. provider found   TOC s/p TAVR  History of Present Illness:    Louis Koch is a 88 y.o. 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  The patient reports having had a heart murmur his whole life. He has been very healthy and it never caused him any limitation. His cardiac history dates back to 2014 when he was incidentally found to be in new onset atrial fibrillation during a preoperative evaluation for cataract surgery. He was started on metoprolol, but patient refused anticoagulation. He later self discontinued metoprolol. Patient has not followed up with cardiology since then. Per review of [facility]where, most recent echo 01/19/14 at [facility] showed EF 65-70%, moderate aortic sclerosis, moderate MR/ mild TR. Patient has not been seeing any medical doctor for the last 5 years.    He was in his usual state of health until 09/2021 when he noticed worsening exercise intolerance, dyspnea on exertion, orthopnea and PND. He ended up being admitted for acute CHF and diagnosed with severe LFLG AS. Echo 11/02/21 suggested severe paradoxical low flow, low gradient aortic stenosis: mean gradient 22 mm hg, peak 32.8 mmhg, AVA 0.83 cm2, DVI 24, SVI 22. EF 55-60%, normal LV wall motion, mild LV hypertrophy, G2DD, mild LAE, mild MR. There is moderate dilatation of the ascending aorta, measuring 44 mm. He underwent pre TAVR work up while admitted. L/RHC showed mild to  moderate nonobstructive coronary artery disease with moderate ostial RCA stenosis, mild to moderate mid RCA stenosis, and an otherwise patent right coronary artery, patent left main with mild plaquing, patent LAD with mild mid vessel stenosis of 40%, patent left circumflex with mild luminal irregularity. With regards to his afib, he was started on Eliquis.    He was evaluated by the multidisciplinary valve team and underwent successful TAVR with a 26 mm Edwards Sapien 3 Ultra Resilia THV via the left TF approach on 11/15/21.  Initially we attempted right TF access but due to device malfunction the valve became caught in the right external iliac artery. Dr. Unk Lightning with [facility] was called in and he underwent right Iliofemoral endarterectomy with bovine pericardial patch. Post operative echo showed EF 40-45%, normally functioning TAVR with a mean gradient of 7.5 mmHg and no PVL. He was discharged on Eliquis.   Today the patient presents to clinic for follow up. Here with his partner Vinnie Level. He has several questions about his afib and valve and all the meds he is on. He says he feels like a million bucks. No CP or SOB. No LE edema, orthopnea or PND. No dizziness or syncope. No blood in stool or urine. No palpitations.      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 Angelena Form & Dr. Cyndia Bent   Severe aortic stenosis     Past Surgical History:  Procedure Laterality Date   INTRAOPERATIVE TRANSTHORACIC ECHOCARDIOGRAM N/A 11/15/2021   Procedure: INTRAOPERATIVE TRANSTHORACIC ECHOCARDIOGRAM;  Surgeon: Burnell Blanks, MD;  Location: [place] [facility];  Service: Open Heart Surgery;  Laterality: N/A;   KNEE SURGERY      RIGHT/LEFT HEART CATH AND CORONARY ANGIOGRAPHY N/A 11/04/2021   Procedure: RIGHT/LEFT HEART CATH AND CORONARY ANGIOGRAPHY;  Surgeon: Sherren Mocha, MD;  Location: [place] [facility];  Service: Cardiovascular;  Laterality: N/A;   TRANSCATHETER AORTIC VALVE REPLACEMENT, TRANSFEMORAL Right 11/15/2021   Procedure: TRANSCATHETER AORTIC VALVE REPLACEMENT, TRANSFEMORAL;  Surgeon: Burnell Blanks, MD;  Location: [place] [facility];  Service: Open Heart Surgery;  Laterality: Right;   VASCULAR SURGERY      Current Medications: Current Meds  Medication Sig   apixaban (ELIQUIS) 5 MG TABS tablet Take 1 tablet (5 mg total) by mouth 2 (two) times daily.   Cholecalciferol (D3 PO) Take 1 capsule by mouth daily.   Cyanocobalamin (B-12 PO) Take 1 tablet by mouth daily.   Ferrous Sulfate (IRON) 28 MG TABS Take 1 tablet by mouth daily.   furosemide (LASIX) 40 MG tablet Take 1 tablet (40 mg total) by mouth daily.   metoprolol tartrate (LOPRESSOR) 25 MG tablet Take 1/2 tablet (12.5 mg total) by mouth 2 (two) times daily.   Multiple Vitamin (MULTIVITAMIN WITH MINERALS) TABS tablet Take 1 tablet by mouth 3 (three) times a week.   potassium chloride SA (KLOR-CON M) 20 MEQ tablet Take 1 tablet (20 mEq total) by mouth daily.   Zinc 30 MG TABS Take 30 mg by mouth daily.     Allergies:   Patient has no known allergies.   Social History   Socioeconomic History   Marital status: Unknown    Spouse name: Not on file   Number of children: Not on file   Years of education: Not on file   Highest education level: Not on file  Occupational History   Not on file  Tobacco Use   Smoking status: Never   Smokeless tobacco: Not on file  Vaping Use   Vaping Use: Never used  Substance and Sexual Activity   Alcohol use: Yes    Comment: occasionally   Drug use: No   Sexual activity: Not on file  Other Topics Concern   Not on file  Social History Narrative   Not on file   Social Determinants of Health    Financial Resource Strain: Not on file  Food Insecurity: Not on file  Transportation Needs: Not on file  Physical Activity: Not on file  Stress: Not on file  Social Connections: Not on file     Family History: The patient's family history includes Atrial fibrillation in his sister; Irregular heart beat in his sister.  ROS:   Please see the history of present illness.    All other systems reviewed and are negative.  EKGs/Labs/Other Studies Reviewed:    The following studies were reviewed today:  TAVR OPERATIVE NOTE     Date of Procedure:                11/15/2021   Preoperative Diagnosis:      Severe Aortic Stenosis  Postoperative Diagnosis:    Same    Procedure:        Transcatheter Aortic Valve Replacement - Percutaneous Left Transfemoral Approach             Edwards Sapien 3 Ultra ResiliaTHV (size 26 mm, model # 9755RSL, serial # [id number])              Co-Surgeons:                        Alleen Borne, MD and   Verne Carrow, MD     Anesthesiologist:                  Jerrye Noble, MD   Echocardiographer:              Lacretia Nicks. O'Neal, MD   Pre-operative Echo Findings: Severe aortic stenosis Mild left ventricular systolic dysfunction   Post-operative Echo Findings: Trivial paravalvular leak Unchanged mild left ventricular systolic dysfunction _____________     Echo 11/16/21:  IMPRESSIONS   1. 26 mm S3 TAVR. Vmax 1.7 m/s, MG 7.5 mmHG, EOA 2.35 cm2, DI 0.53.  Paravalvular leak not visualized on this study. Mild PVL noted on day of  procedure and not seen on this study. Normal appearing valve with normal  gradients. The aortic valve has been  repaired/replaced. Aortic valve regurgitation is not visualized. There is  a 26 mm Sapien prosthetic (TAVR) valve present in the aortic position.  Procedure Date: 11/15/2021. Echo findings are consistent with normal  structure and function of the aortic  valve prosthesis.   2. Left ventricular ejection fraction, by  estimation, is 40 to 45%. The  left ventricle has mildly decreased function. The left ventricle  demonstrates global hypokinesis. Left ventricular diastolic function could  not be evaluated.   3. Right ventricular systolic function is normal. The right ventricular  size is normal. Tricuspid regurgitation signal is inadequate for assessing  PA pressure.   4. The mitral valve is degenerative. Trivial mitral valve regurgitation.  No evidence of mitral stenosis.   5. The inferior vena cava is normal in size with greater than 50%  respiratory variability, suggesting right atrial pressure of 3 mmHg.    EKG:  EKG is ordered today.  The ekg ordered today demonstrates afib HR 64  Recent Labs: 11/14/2021: ALT 23; B Natriuretic Peptide 114.4 11/16/2021: BUN 15; Creatinine, Ser 0.82; Hemoglobin 11.5; Magnesium 2.0; Platelets 174; Potassium 4.1; Sodium 136  Recent Lipid Panel No results found for: CHOL, TRIG, HDL, CHOLHDL, VLDL, LDLCALC, LDLDIRECT   Risk Assessment/Calculations:       Physical Exam:    VS:  BP 102/72    Pulse 64    Ht 6\' 5"  (1.956 m)    Wt 281 lb (127.5 kg)    SpO2 99%    BMI 33.32 kg/m     Wt Readings from Last 3 Encounters:  11/23/21 281 lb (127.5 kg)  11/16/21 278 lb 7.1 oz (126.3 kg)  11/14/21 282 lb 6.4 oz (128.1 kg)     GEN:  Well nourished, well developed in no acute distress HEENT: Normal NECK: No JVD LYMPHATICS: No lymphadenopathy CARDIAC: irreg irreg. no murmurs, rubs, gallops RESPIRATORY:  Clear to auscultation without rales, wheezing or rhonchi  ABDOMEN: Soft, non-tender, non-distended MUSCULOSKELETAL:  No edema; No deformity  SKIN: Warm and dry.  Groin sites clear without hematoma or ecchymosis  NEUROLOGIC:  Alert and oriented x 3 PSYCHIATRIC:  Normal affect  ASSESSMENT:    1. S/P TAVR (transcatheter aortic valve replacement)   2. Chronic diastolic CHF (congestive heart failure) ([place])   3. Essential hypertension   4. Chronic atrial fibrillation  (HCC)    PLAN:    In order of problems listed above:  Severe AS s/p TAVR: doing excellent 1 week out from TAVR. Groin sites including cut down are healing well. ECG with no HAVB. Continue Eliquis alone. SBE prophylaxis discussed; I have RX'd amoxicillin. I will see him back next month for follow up and echo.    Chronic diastolic CHF: appears euvolemic. Continue lasix 40mg  daily.    HTN: BP well controlled. No changes made.    Chronic afib: rate well controlled on Metoprolol. Continue on Eliquis. I did give him samples. We had a long talk about his annual stroke risk and how aspirin does not protect him from this. He does not have Medicare Rx coverage and will have to pay out of pocket. He was given patient assistance forms but not sure he will qualify based on income. We discussed other options like Coumadin, but he was very reluctant to consider this.       Medication Adjustments/Labs and Tests Ordered: Current medicines are reviewed at length with the patient today.  Concerns regarding medicines are outlined above.  No orders of the defined types were placed in this encounter.  No orders of the defined types were placed in this encounter.   There are no Patient Instructions on file for this visit.   Signed, Angelena Form, PA-C  11/23/2021 3:12 PM    [facility]

## 2021-11-23 NOTE — Patient Instructions (Addendum)
Medication Instructions:  Start Amoxicillin 500 mg, take 4 tablets by mouth 1 hour prior to dental procedures and cleanings   *If you need a refill on your cardiac medications before your next appointment, please call your pharmacy*   Lab Work: None ordered today   If you have labs (blood work) drawn today and your tests are completely normal, you will receive your results only by: MyChart Message (if you have MyChart) OR A paper copy in the mail If you have any lab test that is abnormal or we need to change your treatment, we will call you to review the results.   Testing/Procedures: None ordered today    Follow-Up: Follow up as scheduled    Other Instructions None

## 2021-12-02 ENCOUNTER — Ambulatory Visit (INDEPENDENT_AMBULATORY_CARE_PROVIDER_SITE_OTHER): Payer: Medicare Other | Admitting: Physician Assistant

## 2021-12-02 ENCOUNTER — Other Ambulatory Visit: Payer: Self-pay

## 2021-12-02 VITALS — BP 115/72 | HR 75 | Temp 97.7°F | Ht 77.0 in | Wt 281.0 lb

## 2021-12-02 DIAGNOSIS — I7772 Dissection of iliac artery: Secondary | ICD-10-CM

## 2021-12-02 NOTE — Progress Notes (Signed)
°  POST OPERATIVE OFFICE NOTE    CC:  F/u for surgery  HPI:  This is a 88 y.o. male who sustained an intimal injury to the external iliac and common femoral artery on the right during TAVR   is s/p  Iliofemoral endarterectomy, bovine pericardial patch on 11/15/21 by Dr. Karin Lieu.    Pt returns today for follow up.  Pt states he is ambulating without claudication or rest pain.  The righ groin incision has completely healed.  He denies pain, fever or chills. He is ambulatory independently with [facility].   No Known Allergies  Current Outpatient Medications  Medication Sig Dispense Refill   amoxicillin (AMOXIL) 500 MG tablet Take 4 tablets by mouth 1 hour prior to dental procedures and cleanings 12 tablet 6   apixaban (ELIQUIS) 5 MG TABS tablet Take 1 tablet (5 mg total) by mouth 2 (two) times daily. 60 tablet 0   Cholecalciferol (D3 PO) Take 1 capsule by mouth daily.     Cyanocobalamin (B-12 PO) Take 1 tablet by mouth daily.     Ferrous Sulfate (IRON) 28 MG TABS Take 1 tablet by mouth daily.     furosemide (LASIX) 40 MG tablet Take 1 tablet (40 mg total) by mouth daily. 30 tablet 0   metoprolol tartrate (LOPRESSOR) 25 MG tablet Take 1/2 tablet (12.5 mg total) by mouth 2 (two) times daily. 60 tablet 0   Multiple Vitamin (MULTIVITAMIN WITH MINERALS) TABS tablet Take 1 tablet by mouth 3 (three) times a week.     potassium chloride SA (KLOR-CON M) 20 MEQ tablet Take 1 tablet (20 mEq total) by mouth daily. 30 tablet 0   Zinc 30 MG TABS Take 30 mg by mouth daily.     No current facility-administered medications for this visit.     ROS:  See HPI  Physical Exam:     Incision:  well healed without skin compromise or heamtoma Extremities:  palpable DP pulse, skin without ischemic changes right LE Heart: A fib irregularly irregular Abdomen:  soft NTTP   Assessment/Plan:  This is a 88 y.o. male who is s/p:This is a 88 y.o. male who sustained an intimal injury to the external iliac and common  femoral artery on the right during TAVR   is s/p  Iliofemoral endarterectomy, bovine pericardial patch on 11/15/21 by Dr. Karin Lieu.    He is back to baseline with well healed right groin incision.  He denies symptoms of LE ischemia.  Activity as tolerates and f/u PRN.      Mosetta Pigeon PA-C [facility] [phone]   Clinic MD:  Karin Lieu

## 2021-12-06 ENCOUNTER — Ambulatory Visit: Payer: Medicare Other | Admitting: Cardiovascular Disease

## 2021-12-07 ENCOUNTER — Other Ambulatory Visit (HOSPITAL_COMMUNITY): Payer: Self-pay

## 2021-12-09 ENCOUNTER — Other Ambulatory Visit (HOSPITAL_COMMUNITY): Payer: Self-pay

## 2021-12-09 MED ORDER — POTASSIUM CHLORIDE CRYS ER 20 MEQ PO TBCR: 20.0000 meq | EXTENDED_RELEASE_TABLET | Freq: Every day | ORAL | 1 refills | Status: DC

## 2021-12-09 MED ORDER — FUROSEMIDE 40 MG PO TABS: 40.0000 mg | ORAL_TABLET | Freq: Every day | ORAL | 1 refills | Status: DC

## 2021-12-12 NOTE — Progress Notes (Addendum)
[facility]   MULTIDISCIPLINARY HEART VALVE CLINIC                                     Cardiology Office Note:    Date:  12/15/2021   ID:  Louis Koch, DOB July 23, 1927, MRN [id number]  PCP:  Pcp, No  [facility] Cardiologist:  Jodelle Red, MD / Dr. Clifton Simcha & Dr. Laneta Simmers (TAVR) [facility] Electrophysiologist:  None   Referring MD: No ref. provider found   1 month s/p TAVR  History of Present Illness:    Louis Koch is a 88 y.o. 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  The patient reports having had a heart murmur his whole life. He has been very healthy and it never caused him any limitation. His cardiac history dates back to 2014 when he was incidentally found to be in new onset atrial fibrillation during a preoperative evaluation for cataract surgery. He was started on metoprolol, but patient refused anticoagulation. He later self discontinued metoprolol. Patient did not follow up with cardiology or PCP.  He was in his usual state of health until 09/2021 when he noticed worsening exercise intolerance, dyspnea on exertion, orthopnea and PND. He ended up being admitted for acute CHF and diagnosed with severe LFLG AS. Echo 11/02/21 suggested severe paradoxical low flow, low gradient aortic stenosis: mean gradient 22 mm hg, peak 32.8 mmhg, AVA 0.83 cm2, DVI 24, SVI 22. EF 55-60%, normal LV wall motion, mild LV hypertrophy, G2DD, mild LAE, mild MR. There was moderate dilatation of the ascending aorta, measuring 44 mm. He underwent pre TAVR work up while admitted. L/RHC showed mild to moderate nonobstructive coronary artery disease with moderate ostial RCA stenosis, mild to moderate mid RCA stenosis, and an otherwise patent right coronary artery, patent left main with mild plaquing,  patent LAD with mild mid vessel stenosis of 40%, patent left circumflex with mild luminal irregularity. With regards to his afib, he was started on Eliquis.    He was evaluated by the multidisciplinary valve team and underwent successful TAVR with a 26 mm Edwards Sapien 3 Ultra Resilia THV via the left TF approach on 11/15/21.  Initially we attempted right TF access but due to device malfunction the valve became caught in the right external iliac artery. Dr. Sherral Hammers with [facility] was called in and he underwent right Iliofemoral endarterectomy with bovine pericardial patch. Post operative echo showed EF 40-45%, normally functioning TAVR with a mean gradient of 7.5 mmHg and no PVL. He was discharged on Eliquis. He has done great in follow up with a big symptomatic improvement.   Today the patient presents to clinic for follow up. Her with his significant other. No CP or SOB. No LE edema, orthopnea or PND. Occasional dizziness but not syncope. No blood in stool or urine. No palpitations. They sent my a mychart about stopping metoprolol due to occasional dizziness, but he has stayed on it and doing fine. He initially had some cognitive issues after surgery but these have totally resolved. Groin site checked recently at vascular apt and this was healing quite well.   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 Clifton Shondale & Dr. Laneta Simmers   Severe aortic stenosis     Past Surgical History:  Procedure Laterality Date   INTRAOPERATIVE TRANSTHORACIC ECHOCARDIOGRAM N/A 11/15/2021   Procedure: INTRAOPERATIVE TRANSTHORACIC ECHOCARDIOGRAM;  Surgeon: Kathleene Hazel, MD;  Location: [facility] INVASIVE CV LAB;  Service: Open Heart Surgery;   Laterality: N/A;   KNEE SURGERY     RIGHT/LEFT HEART CATH AND CORONARY ANGIOGRAPHY N/A 11/04/2021   Procedure: RIGHT/LEFT HEART CATH AND CORONARY ANGIOGRAPHY;  Surgeon: Tonny Bollman, MD;  Location: [facility] INVASIVE CV LAB;  Service: Cardiovascular;  Laterality: N/A;   TRANSCATHETER AORTIC VALVE REPLACEMENT, TRANSFEMORAL Right 11/15/2021   Procedure: TRANSCATHETER AORTIC VALVE REPLACEMENT, TRANSFEMORAL;  Surgeon: Kathleene Hazel, MD;  Location: [facility] INVASIVE CV LAB;  Service: Open Heart Surgery;  Laterality: Right;   VASCULAR SURGERY      Current Medications: No outpatient medications have been marked as taking for the 12/14/21 encounter (Office Visit) with [facility].     Allergies:   Patient has no known allergies.   Social History   Socioeconomic History   Marital status: Unknown    Spouse name: Not on file   Number of children: Not on file   Years of education: Not on file   Highest education level: Not on file  Occupational History   Not on file  Tobacco Use   Smoking status: Never   Smokeless tobacco: Not on file  Vaping Use   Vaping Use: Never used  Substance and Sexual Activity   Alcohol use: Yes    Comment: occasionally   Drug use: No   Sexual activity: Not on file  Other Topics Concern   Not on file  Social History Narrative   Not on file   Social Determinants of Health   Financial Resource Strain: Not on file  Food Insecurity: Not on file  Transportation Needs: Not on file  Physical Activity: Not on file  Stress: Not on file  Social Connections: Not on file     Family History: The patient's family history includes Atrial fibrillation in his sister; Irregular heart beat in his sister.  ROS:   Please see the history of present illness.    All other systems reviewed and are negative.  EKGs/Labs/Other Studies Reviewed:    The following studies were reviewed today:  TAVR OPERATIVE NOTE     Date of Procedure:                 11/15/2021   Preoperative Diagnosis:      Severe Aortic Stenosis    Postoperative Diagnosis:    Same    Procedure:        Transcatheter Aortic Valve Replacement - Percutaneous Left Transfemoral Approach             Edwards Sapien 3 Ultra ResiliaTHV (size 26 mm, model # 9755RSL, serial # [id number])              Co-Surgeons:                        Alleen Borne, MD and   Verne Carrow, MD     Anesthesiologist:                  Jerrye Noble, MD  Echocardiographer:              W. O'Neal, MD   Pre-operative Echo Findings: Severe aortic stenosis Mild left ventricular systolic dysfunction   Post-operative Echo Findings: Trivial paravalvular leak Unchanged mild left ventricular systolic dysfunction _____________     Echo 11/16/21:  IMPRESSIONS   1. 26 mm S3 TAVR. Vmax 1.7 m/s, MG 7.5 mmHG, EOA 2.35 cm2, DI 0.53.  Paravalvular leak not visualized on this study. Mild PVL noted on day of  procedure and not seen on this study. Normal appearing valve with normal  gradients. The aortic valve has been  repaired/replaced. Aortic valve regurgitation is not visualized. There is  a 26 mm Sapien prosthetic (TAVR) valve present in the aortic position.  Procedure Date: 11/15/2021. Echo findings are consistent with normal  structure and function of the aortic  valve prosthesis.   2. Left ventricular ejection fraction, by estimation, is 40 to 45%. The  left ventricle has mildly decreased function. The left ventricle  demonstrates global hypokinesis. Left ventricular diastolic function could  not be evaluated.   3. Right ventricular systolic function is normal. The right ventricular  size is normal. Tricuspid regurgitation signal is inadequate for assessing  PA pressure.   4. The mitral valve is degenerative. Trivial mitral valve regurgitation.  No evidence of mitral stenosis.   5. The inferior vena cava is normal in size with greater than 50%  respiratory variability, suggesting right  atrial pressure of 3 mmHg.   ________________________  Echo 12/14/21 IMPRESSIONS   1. Left ventricular ejection fraction, by estimation, is 55 to 60%. The  left ventricle has normal function. The left ventricle has no regional  wall motion abnormalities. There is mild left ventricular hypertrophy.  Left ventricular diastolic parameters  are indeterminate.   2. Right ventricular systolic function is normal. The right ventricular  size is normal.   3. Left atrial size was mildly dilated.   4. Mild mitral valve regurgitation.   5. S/p TAVR (26 mm Edwards S3Ultra Resilia; 11/15/21). Vmax 1.42 m/sec.  Peak and mean gradients 8 and 4 mm Hg respectively Dimensionless index is  0.59. Mild perivalvular regurgitation. Compared to previous perivalvular  regurgitation is slightly more  prominent. . The aortic valve has been repaired/replaced. Aortic valve  regurgitation is mild.   6. There is mild dilatation of the ascending aorta, measuring 43 mm.   7. The inferior vena cava is normal in size with greater than 50%  respiratory variability, suggesting right atrial pressure of 3 mmHg.  EKG:  EKG is NOT ordered today.   Recent Labs: 11/14/2021: ALT 23; B Natriuretic Peptide 114.4 11/16/2021: Hemoglobin 11.5; Magnesium 2.0; Platelets 174 12/14/2021: BUN 21; Creatinine, Ser 0.88; Potassium 4.8; Sodium 142  Recent Lipid Panel No results found for: CHOL, TRIG, HDL, CHOLHDL, VLDL, LDLCALC, LDLDIRECT   Risk Assessment/Calculations:       Physical Exam:    VS:  BP 140/78    Pulse 71    Ht 6\' 5"  (1.956 m)    Wt 280 lb 6.4 oz (127.2 kg)    SpO2 98%    BMI 33.25 kg/m     Wt Readings from Last 3 Encounters:  12/14/21 280 lb 6.4 oz (127.2 kg)  12/02/21 281 lb (127.5 kg)  11/23/21 281 lb (127.5 kg)     GEN:  Well nourished, well developed in no acute distress HEENT: Normal NECK: No JVD LYMPHATICS: No lymphadenopathy CARDIAC: irreg irreg. no murmurs, rubs, gallops RESPIRATORY:  Clear  to  auscultation without rales, wheezing or rhonchi  ABDOMEN: Soft, non-tender, non-distended MUSCULOSKELETAL:  No edema; No deformity  SKIN: Warm and dry.   NEUROLOGIC:  Alert and oriented x 3 PSYCHIATRIC:  Normal affect   ASSESSMENT:    1. S/P TAVR (transcatheter aortic valve replacement)   2. Chronic diastolic CHF (congestive heart failure) (HCC)   3. Essential hypertension   4. Chronic atrial fibrillation (HCC)     PLAN:    In order of problems listed above:  Severe AS s/p TAVR: echo today shows EF 55-60%, normally functioning TAVR with a mean gradient of 4.3 mm hg and mild PVL. He is doing fantastic and has NYHA class I symptoms. Still works and goes into the office everyday. Continue Eliquis alone. SBE prophylaxis discussed; he has amoxicillin. I will see him back in 6 months for follow up.    Chronic diastolic CHF: appears euvolemic. Continue lasix 40mg  daily and Kdur 20 meq daily. Will check a BMET today.    HTN: BP well controlled. No changes made.    Chronic afib: rate well controlled on Metoprolol. Continue on Eliquis.  Patient wants to establish with a PCP here in [place]. I have referred him to [facility] primary care.   ADDENDUM: Lab resulted but did not come to my basket and I could not comment. K on the high end of normal. Will decrease Kdur from 20 meq to 10meq daily.   Medication Adjustments/Labs and Tests Ordered: Current medicines are reviewed at length with the patient today.  Concerns regarding medicines are outlined above.  Orders Placed This Encounter  Procedures   Basic metabolic panel   No orders of the defined types were placed in this encounter.   Patient Instructions  Medication Instructions:  Your physician recommends that you continue on your current medications as directed. Please refer to the Current Medication list given to you today.  *If you need a refill on your cardiac medications before your next appointment, please call your pharmacy*   Lab  Work: TODAY: BMET If you have labs (blood work) drawn today and your tests are completely normal, you will receive your results only by: MyChart Message (if you have MyChart) OR A paper copy in the mail If you have any lab test that is abnormal or we need to change your treatment, we will call you to review the results.   Testing/Procedures: NONE   Follow-Up: At [facility], you and your health needs are our priority.  As part of our continuing mission to provide you with exceptional heart care, we have created designated Provider Care Teams.  These Care Teams include your primary Cardiologist (physician) and Advanced Practice Providers (APPs -  Physician Assistants and Nurse Practitioners) who all work together to provide you with the care you need, when you need it.  We recommend signing up for the patient portal called "MyChart".  Sign up information is provided on this After Visit Summary.  MyChart is used to connect with patients for Virtual Visits (Telemedicine).  Patients are able to view lab/test results, encounter notes, upcoming appointments, etc.  Non-urgent messages can be sent to your provider as well.   To learn more about what you can do with MyChart, go to [email or web address].    Your next appointment:   6 month(s)  The format for your next appointment:   In Person  Provider:   Carlean JewsKatie Zenab Gronewold, PA-C  CALL [facility] AT [phone]    Signed, Cline CrockKathryn Jaylan Duggar, PA-C  12/15/2021 9:35 AM    [facility]

## 2021-12-12 NOTE — Telephone Encounter (Signed)
-----   Message from Janetta Hora, PA-C sent at 12/09/2021  5:23 PM EST ----- Okay to take Metoprolol as needed . Thanks!

## 2021-12-12 NOTE — Telephone Encounter (Signed)
December 09, 2021 Eileen Stanford, PA-C to Me      5:23 PM Okay to take Metoprolol as needed . Thanks!  Eileen Stanford, PA-C to Ronnell Guadalajara      5:23 PM We sent in the refills for Lasix and Potassium! He is taking the lowest dose of the metoprolol so why don't we just stop it. His HR seems so be under reasonable control so I think it's fine to stop completely. He can take it as needed if his heart rate becomes elevated. Hope you have a great weekend! KT  Last read by Ronnell Guadalajara at 12:39 AM on 12/10/2021.

## 2021-12-14 ENCOUNTER — Ambulatory Visit (HOSPITAL_COMMUNITY): Payer: Medicare Other | Attending: Internal Medicine

## 2021-12-14 ENCOUNTER — Other Ambulatory Visit: Payer: Self-pay

## 2021-12-14 ENCOUNTER — Ambulatory Visit (INDEPENDENT_AMBULATORY_CARE_PROVIDER_SITE_OTHER): Payer: Medicare Other | Admitting: Physician Assistant

## 2021-12-14 VITALS — BP 140/78 | HR 71 | Ht 77.0 in | Wt 280.4 lb

## 2021-12-14 DIAGNOSIS — Z952 Presence of prosthetic heart valve: Secondary | ICD-10-CM

## 2021-12-14 DIAGNOSIS — I5032 Chronic diastolic (congestive) heart failure: Secondary | ICD-10-CM

## 2021-12-14 DIAGNOSIS — I10 Essential (primary) hypertension: Secondary | ICD-10-CM | POA: Diagnosis not present

## 2021-12-14 DIAGNOSIS — I482 Chronic atrial fibrillation, unspecified: Secondary | ICD-10-CM

## 2021-12-14 LAB — ECHOCARDIOGRAM COMPLETE
AV Mean grad: 4.3 mmHg
AV Peak grad: 8.1 mmHg
Ao pk vel: 1.42 m/s
P 1/2 time: 510 msec
S' Lateral: 3.7 cm

## 2021-12-14 NOTE — Patient Instructions (Addendum)
Medication Instructions:  Your physician recommends that you continue on your current medications as directed. Please refer to the Current Medication list given to you today.  *If you need a refill on your cardiac medications before your next appointment, please call your pharmacy*   Lab Work: TODAY: BMET If you have labs (blood work) drawn today and your tests are completely normal, you will receive your results only by: MyChart Message (if you have MyChart) OR A paper copy in the mail If you have any lab test that is abnormal or we need to change your treatment, we will call you to review the results.   Testing/Procedures: NONE   Follow-Up: At [facility], you and your health needs are our priority.  As part of our continuing mission to provide you with exceptional heart care, we have created designated Provider Care Teams.  These Care Teams include your primary Cardiologist (physician) and Advanced Practice Providers (APPs -  Physician Assistants and Nurse Practitioners) who all work together to provide you with the care you need, when you need it.  We recommend signing up for the patient portal called "MyChart".  Sign up information is provided on this After Visit Summary.  MyChart is used to connect with patients for Virtual Visits (Telemedicine).  Patients are able to view lab/test results, encounter notes, upcoming appointments, etc.  Non-urgent messages can be sent to your provider as well.   To learn more about what you can do with MyChart, go to [email or web address].    Your next appointment:   6 month(s)  The format for your next appointment:   In Person  Provider:   [facility] AT [phone]

## 2021-12-15 ENCOUNTER — Other Ambulatory Visit: Payer: Self-pay | Admitting: Physician Assistant

## 2021-12-15 LAB — BASIC METABOLIC PANEL
BUN/Creatinine Ratio: 24 (ref 10–24)
BUN: 21 mg/dL (ref 10–36)
CO2: 25 mmol/L (ref 20–29)
Calcium: 9.3 mg/dL (ref 8.6–10.2)
Chloride: 103 mmol/L (ref 96–106)
Creatinine, Ser: 0.88 mg/dL (ref 0.76–1.27)
Glucose: 106 mg/dL — ABNORMAL HIGH (ref 70–99)
Potassium: 4.8 mmol/L (ref 3.5–5.2)
Sodium: 142 mmol/L (ref 134–144)
eGFR: 80 mL/min/{1.73_m2} (ref >59)

## 2021-12-15 MED ORDER — POTASSIUM CHLORIDE CRYS ER 10 MEQ PO TBCR: 10.0000 meq | EXTENDED_RELEASE_TABLET | Freq: Every day | ORAL | 3 refills | Status: AC

## 2022-01-15 IMAGING — CT CT ANGIO CHEST
2 of 7 series · 15 of 36 positions shown · IV contrast (APPLIED)
Comparison: No priors.

CLINICAL DATA: [AGE] male with history of severe aortic
stenosis. Preprocedural study prior to potential transcatheter
aortic valve replacement (TAVR) procedure.

EXAM:
CT ANGIOGRAPHY CHEST, ABDOMEN AND PELVIS
TECHNIQUE: Multidetector CT imaging through the chest, abdomen and pelvis was
performed using the standard protocol during bolus administration of
intravenous contrast. Multiplanar reconstructed images and MIPs were
obtained and reviewed to evaluate the vascular anatomy.
CONTRAST:  100mL OMNIPAQUE IOHEXOL 350 MG/ML SOLN

[Series 9: ax thins · axial · 0.59mm/px · z∈[+654,+1390]mm · 14 of 825 slices shown]
[im 44/825  lung]
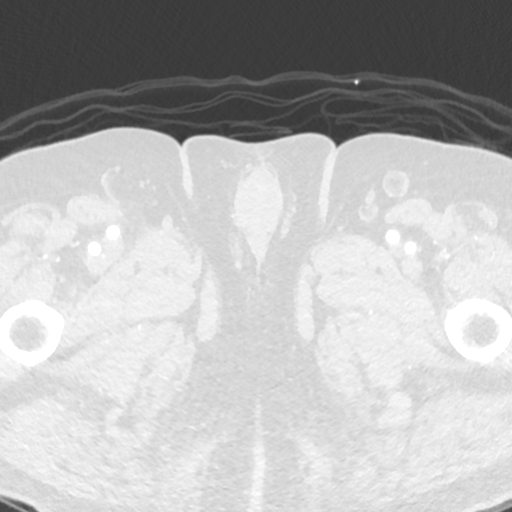
[im 87/825  mediastinal]
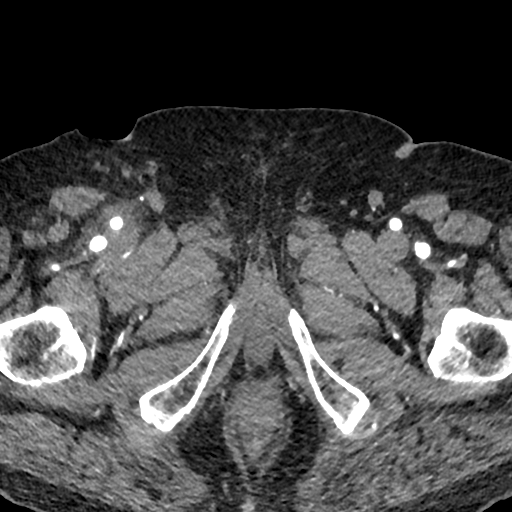
[im 174/825  lung]
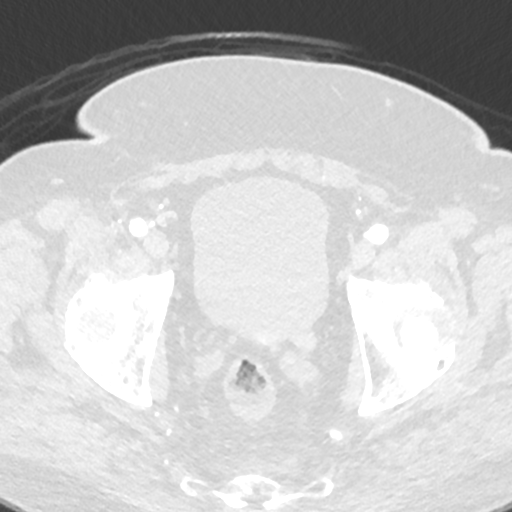
[im 217/825  mediastinal]
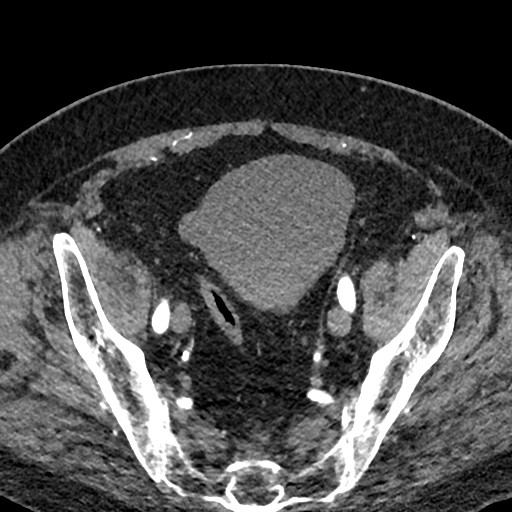
[im 261/825  lung]
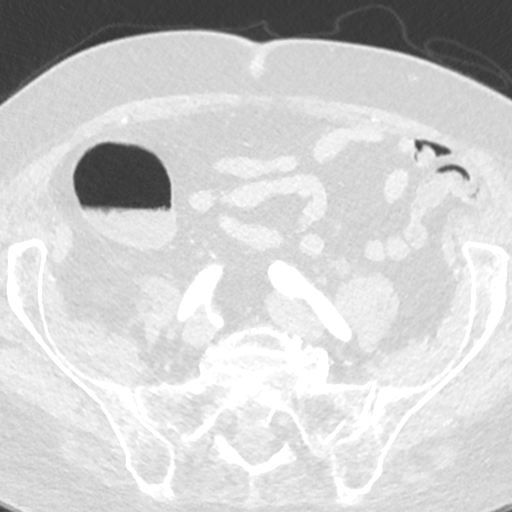
[im 347/825  mediastinal]
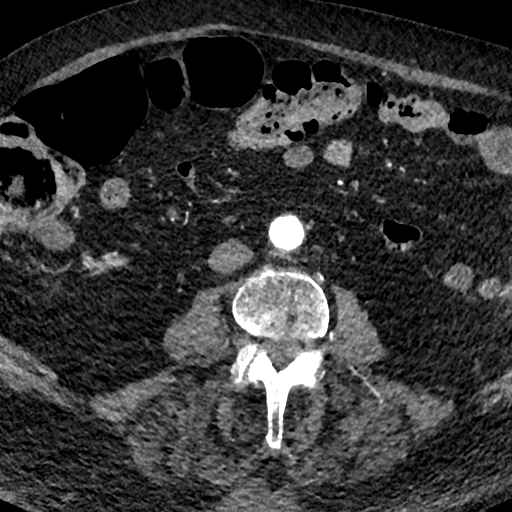
[im 391/825  lung]
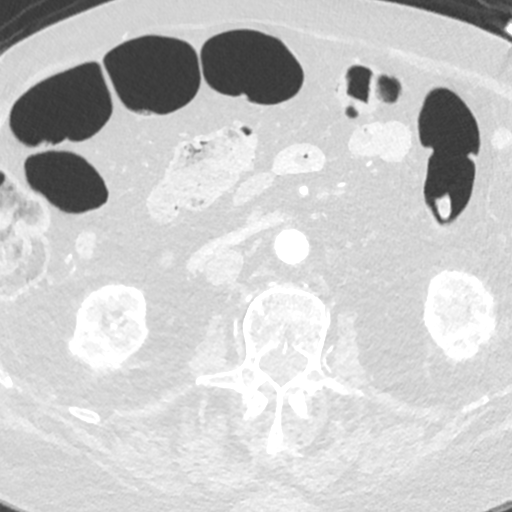
[im 434/825  mediastinal]
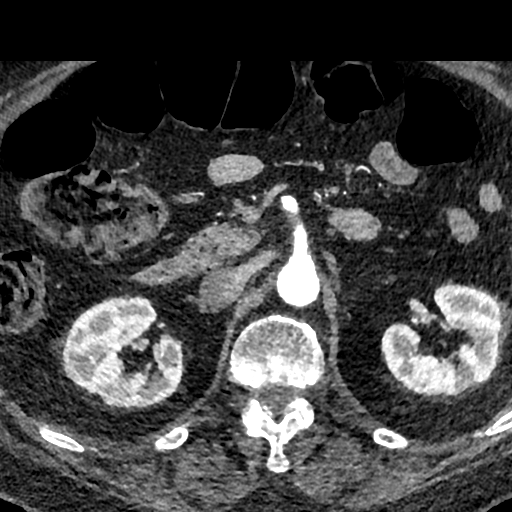
[im 478/825  lung]
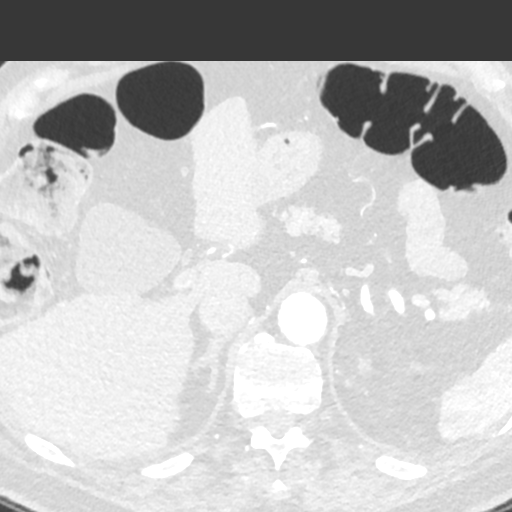
[im 564/825  mediastinal]
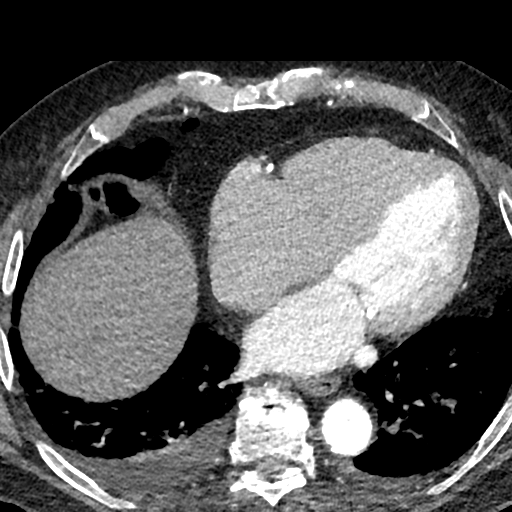
[im 608/825  lung]
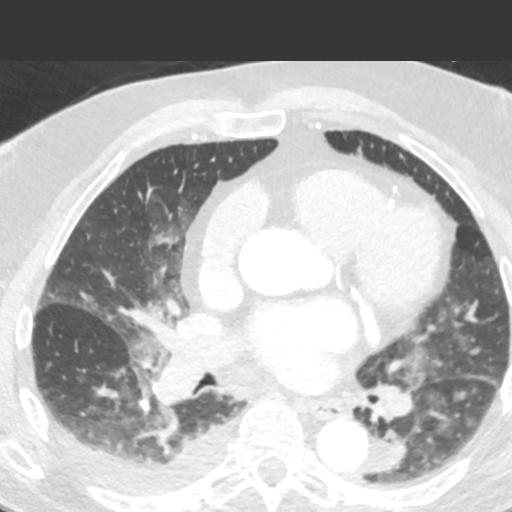
[im 651/825  mediastinal]
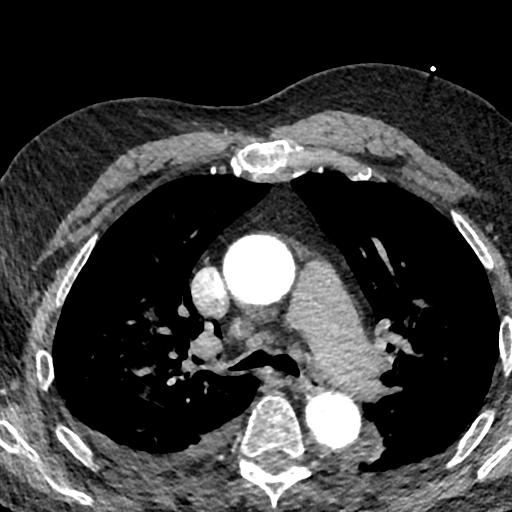
[im 738/825  lung]
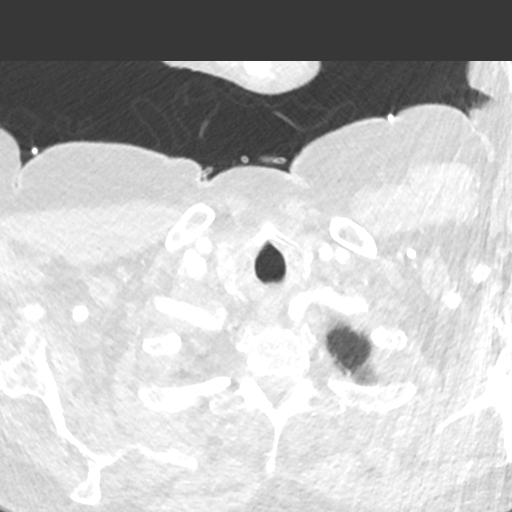
[im 781/825  mediastinal]
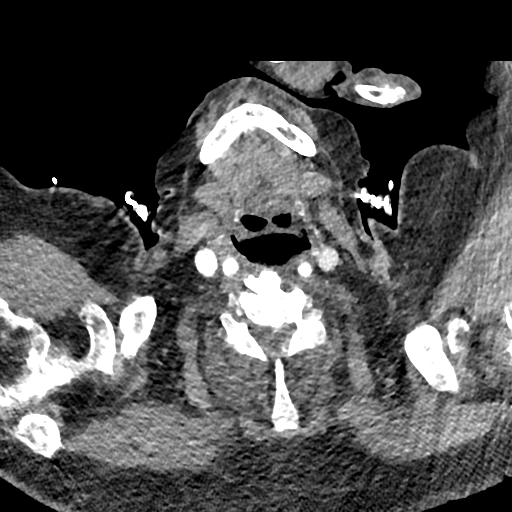

[Series 15: cor · coronal · 0.98mm/px · 1 of 203 slices shown]
[im 102/203  mediastinal]
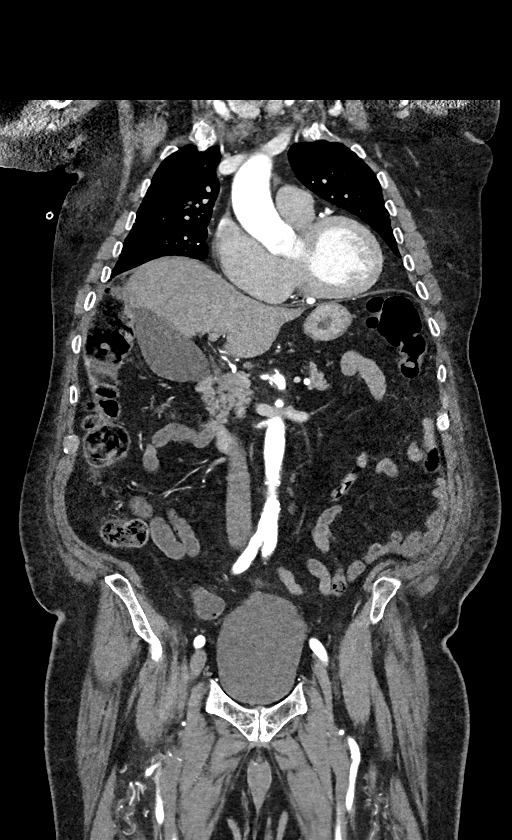

[15 of 36 positions shown; findings below may reference images not displayed]

FINDINGS: CTA CHEST FINDINGS

Cardiovascular: Heart size is mildly enlarged with left atrial
dilatation, but normal left ventricular size. There is no
significant pericardial fluid, thickening or pericardial
calcification. There is aortic atherosclerosis, as well as
atherosclerosis of the great vessels of the mediastinum and the
coronary arteries, including calcified atherosclerotic plaque in the
left main, left anterior descending, left circumflex and right
coronary arteries. Severe thickening and calcification of the aortic
valve. Mild calcifications of the mitral annulus.

Mediastinum/Lymph Nodes: No pathologically enlarged mediastinal or
hilar lymph nodes. Esophagus is unremarkable in appearance. No
axillary lymphadenopathy.

Lungs/Pleura: Small right and trace left pleural effusions lying
dependently. Patchy areas of peribronchovascular predominant
ground-glass attenuation are noted in the central aspects of the
lungs bilaterally, most severe throughout the mid to upper lungs. No
definite suspicious appearing pulmonary nodules or masses are noted.

Musculoskeletal/Soft Tissues: There are no aggressive appearing
lytic or blastic lesions noted in the visualized portions of the
skeleton.

CTA ABDOMEN AND PELVIS FINDINGS

Hepatobiliary: No suspicious cystic or solid hepatic lesions. No
intra or extrahepatic biliary ductal dilatation. Gallbladder is
normal in appearance.

Pancreas: No pancreatic mass. No pancreatic ductal dilatation. No
pancreatic or peripancreatic fluid collections or inflammatory
changes.

Spleen: Unremarkable.

Adrenals/Urinary Tract: Generalized cortical atrophy in the kidneys
bilaterally. No suspicious renal lesions. No hydroureteronephrosis.
Bilateral adrenal glands are normal in appearance. 1.9 cm
diverticulum in the right-side of the urinary bladder incidentally
noted. Urinary bladder is otherwise unremarkable in appearance.

Stomach/Bowel: The appearance of the stomach is normal. There is no
pathologic dilatation of small bowel or colon. The appendix is not
confidently identified and may be surgically absent. Regardless,
there are no inflammatory changes noted adjacent to the cecum to
suggest the presence of an acute appendicitis at this time.

Vascular/Lymphatic: Vascular findings and measurements pertinent to
potential TAVR procedure, as detailed below. No aneurysm or
dissection noted in the abdominal or pelvic vasculature.
Intermediate attenuation fluid surrounding the right superficial
femoral and common femoral arteries, likely residual blood products
related to recent vascular access. No lymphadenopathy noted in the
abdomen or pelvis.

Reproductive: Prostate gland and seminal vesicles are unremarkable
in appearance.

Other: No significant volume of ascites.  No pneumoperitoneum.

Musculoskeletal: There are no aggressive appearing lytic or blastic
lesions noted in the visualized portions of the skeleton.

VASCULAR MEASUREMENTS PERTINENT TO TAVR:

AORTA:

Minimal Aortic Diameter-YN x 17 mm

Severity of Aortic Calcification-mild

RIGHT PELVIS:

Right Common Iliac Artery -

Minimal Biameter-ST.B x 12.3 mm

Tortuosity-mild

Calcification-minimal

Right External Iliac Artery -

Minimal Biameter-RD.O x 9.5 mm

Tortuosity-severe

Calcification-none

Right Common Femoral Artery -

Minimal Hiameter-CE.J x 10.7 mm

Tortuosity-mild

Calcification-minimal

LEFT PELVIS:

Left Common Iliac Artery -

Minimal 0iameter-EF.E x 12.7 mm

Tortuosity-moderate

Calcification-mild

Left External Iliac Artery -

Minimal Miameter-K.V x 9.7 mm

Tortuosity-severe

Calcification-none

Left Common Femoral Artery -

Minimal 5iameter-B1.9 x 10.1 mm

Tortuosity-mild

Calcification-minimal

Review of the MIP images confirms the above findings.
IMPRESSION: 1. Vascular findings and measurements pertinent to potential TAVR
procedure, as detailed above.
2. Severe thickening calcification of the aortic valve, compatible
with reported clinical history of severe aortic stenosis.
3. The appearance of the lungs could reflect pulmonary edema. Given
the presence of small bilateral pleural effusions and normal left
ventricular size, diastolic congestive heart failure should be
considered. Alternatively, if there is history of fever and sputum
production, multilobar bilateral bronchopneumonia should be
considered.
4. Aortic atherosclerosis, in addition to left main and three-vessel
coronary artery disease.
5. Additional incidental findings, as above.

## 2022-01-24 IMAGING — DX DG CHEST 2V
2 series · 2 of 2 positions shown · non-contrast
Comparison: CT chest 11/05/2021 and chest radiograph 11/01/2021.

CLINICAL DATA: Preadmit for TAVR.  Shortness of breath.

EXAM:
CHEST - 2 VIEW

[w chest pa]
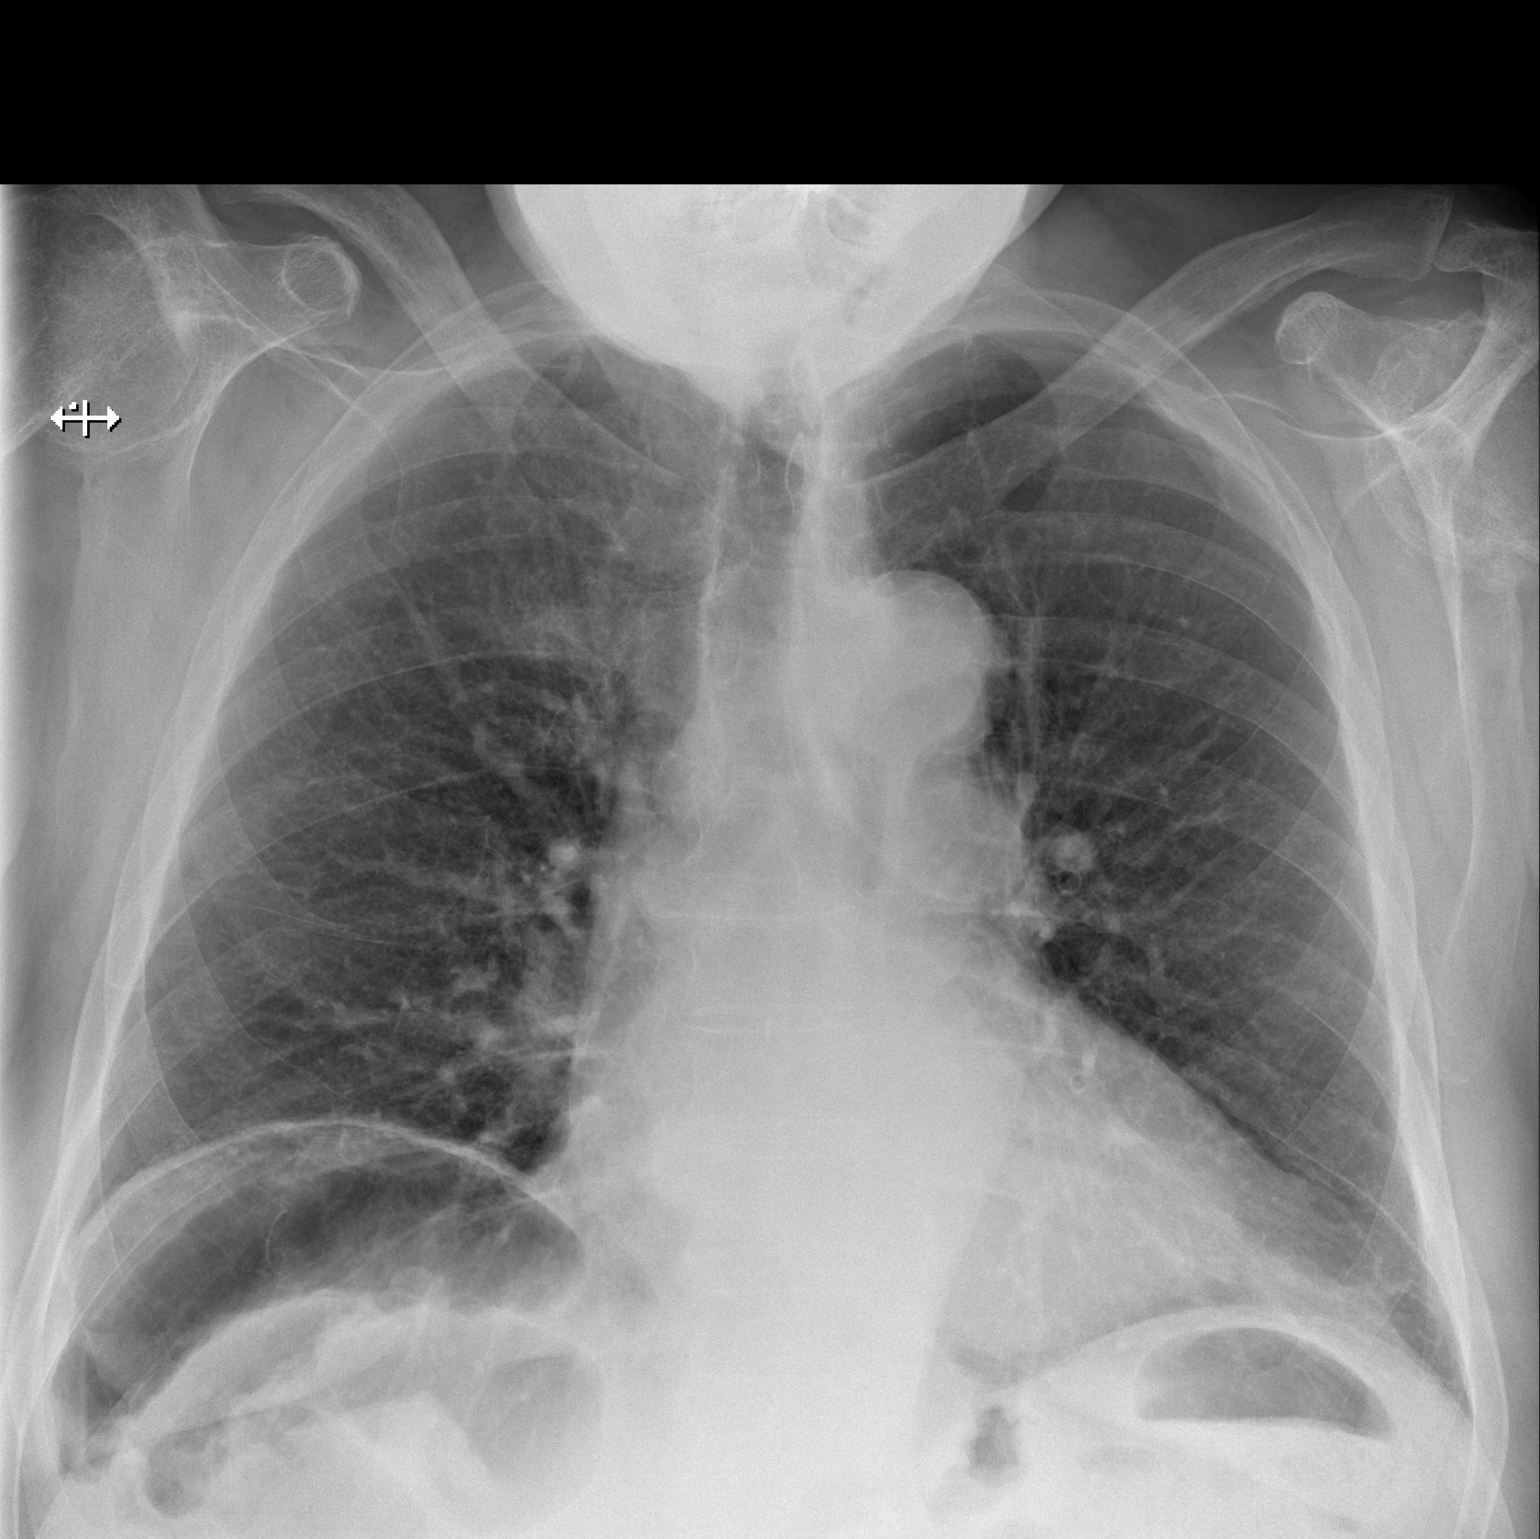

[w chest lat]
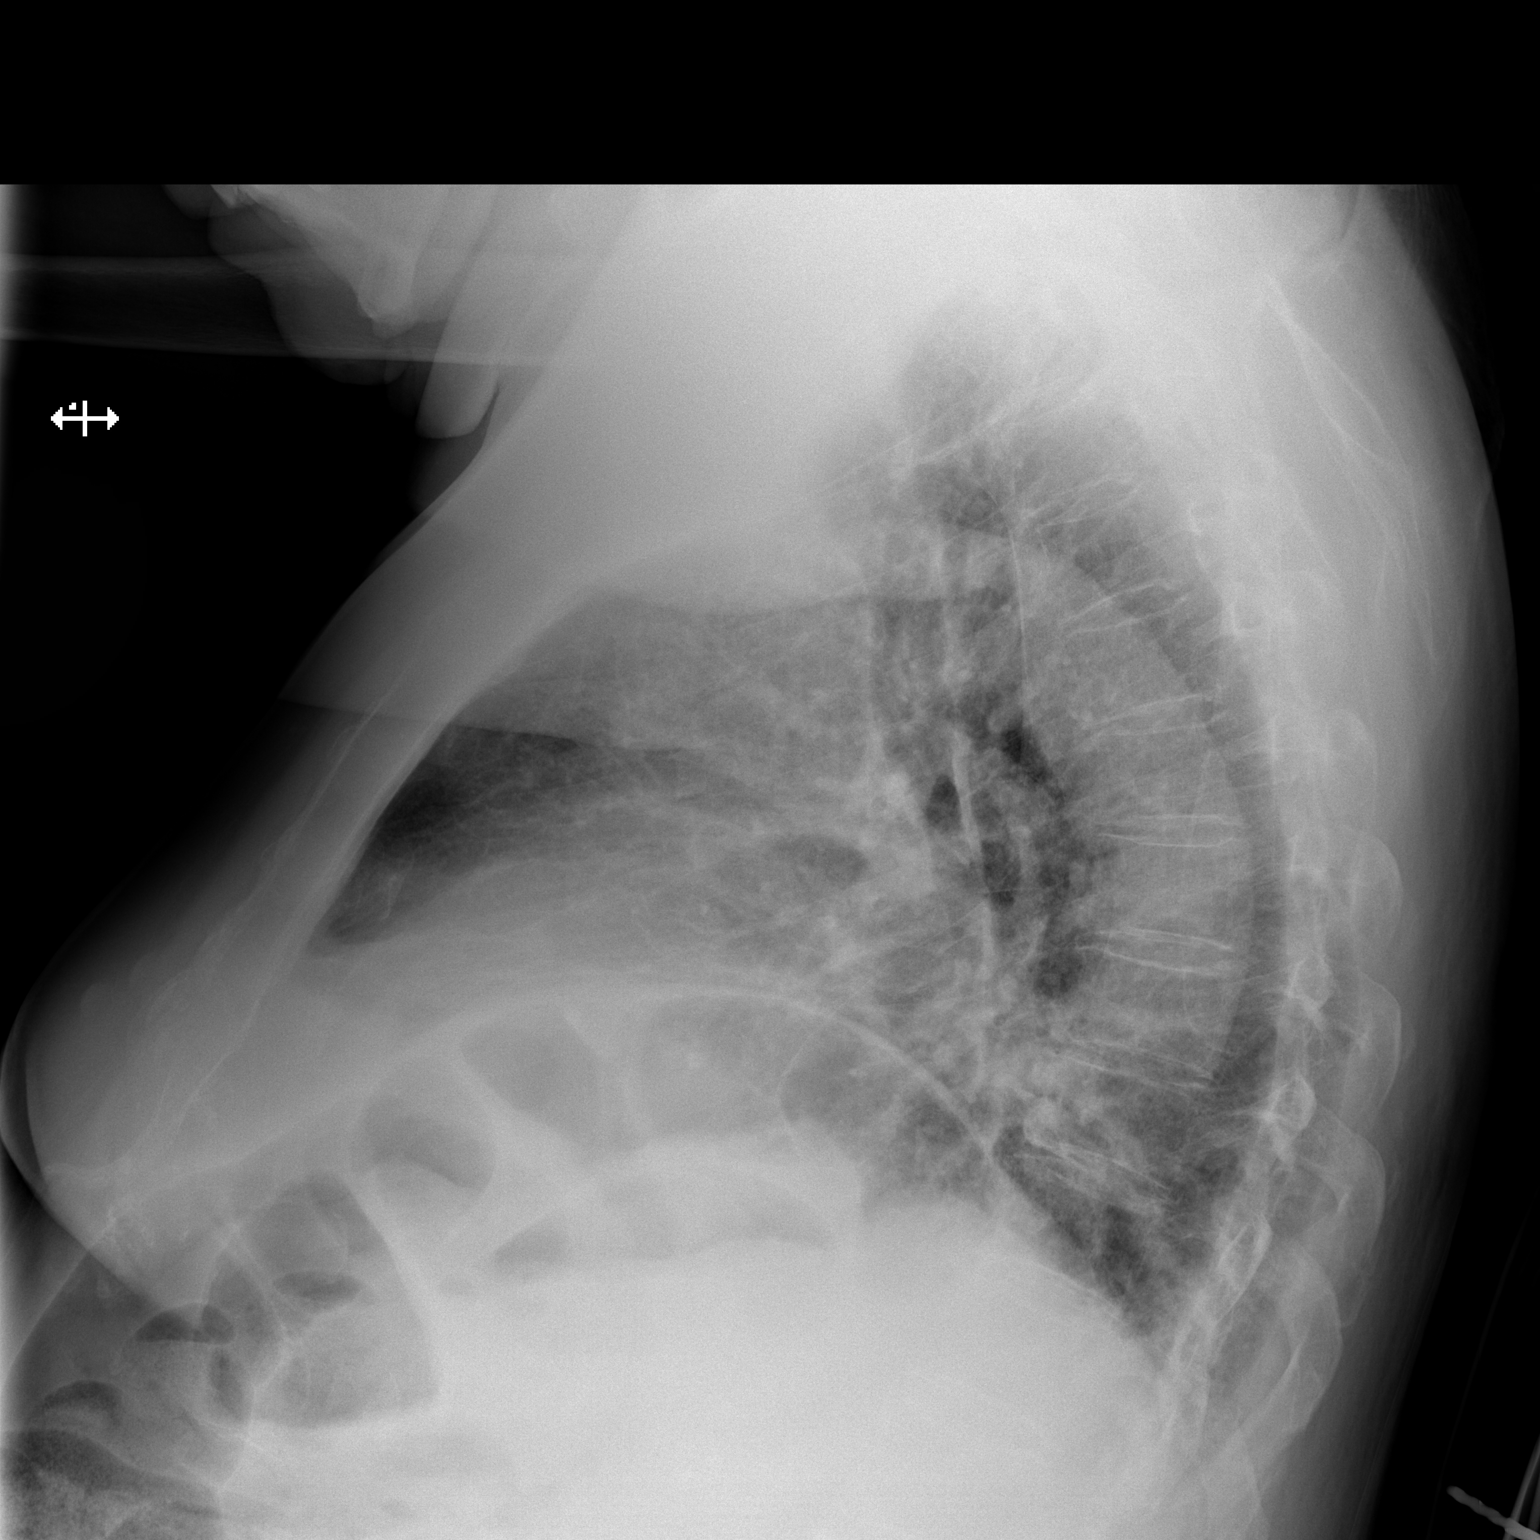

[2 of 2 positions shown; findings below may reference images not displayed]

FINDINGS: Trachea is midline. Heart size stable. Minimal linear scarring in
the left lower lobe. Lungs are otherwise clear. No pleural fluid.
Degenerative changes in the shoulders.
IMPRESSION: No acute findings.

## 2022-02-14 ENCOUNTER — Telehealth (HOSPITAL_BASED_OUTPATIENT_CLINIC_OR_DEPARTMENT_OTHER): Payer: Self-pay | Admitting: Cardiology

## 2022-02-14 NOTE — Telephone Encounter (Signed)
?*  STAT* If patient is at the pharmacy, call can be transferred to refill team. ? ? ?1. Which medications need to be refilled? (please list name of each medication and dose if known) apixaban (ELIQUIS) 5 MG TABS tablet ? ?2. Which pharmacy/location (including street and city if local pharmacy) is medication to be sent to? [facility] [place], [address] [place] ? ?3. Do they need a 30 day or 90 day supply? 90 ? ? ?Patient is out of medication  ? ?

## 2022-02-15 MED ORDER — APIXABAN 5 MG PO TABS: 5.0000 mg | ORAL_TABLET | Freq: Two times a day (BID) | ORAL | 1 refills | Status: DC

## 2022-02-15 NOTE — Telephone Encounter (Signed)
Follow Up: ? ? ?Patient is checking on the status of his prescription for Eliquis. He needs this asap please. He have been without it for 3 dayss ? ? ? ? ? ?

## 2022-02-15 NOTE — Telephone Encounter (Signed)
89 M 127.2 kg, SCr 0.88, LOV Carlean Jews 2/23 ? ?Will refill Eliquis 5 mg bid ?

## 2022-03-02 ENCOUNTER — Other Ambulatory Visit: Payer: Self-pay | Admitting: Physician Assistant

## 2022-05-27 ENCOUNTER — Other Ambulatory Visit: Payer: Self-pay | Admitting: Physician Assistant

## 2022-08-28 ENCOUNTER — Other Ambulatory Visit: Payer: Self-pay | Admitting: Physician Assistant

## 2022-08-28 DIAGNOSIS — Z952 Presence of prosthetic heart valve: Secondary | ICD-10-CM

## 2022-09-01 ENCOUNTER — Ambulatory Visit (HOSPITAL_BASED_OUTPATIENT_CLINIC_OR_DEPARTMENT_OTHER): Payer: Medicare Other | Admitting: Cardiology

## 2022-09-01 ENCOUNTER — Telehealth (HOSPITAL_BASED_OUTPATIENT_CLINIC_OR_DEPARTMENT_OTHER): Payer: Self-pay | Admitting: Cardiology

## 2022-09-01 NOTE — Telephone Encounter (Signed)
Left message for patient regarding new appointment time and provider change 09/11/22 at 2:45 pm with Laurann Montana, NP  (Dr. Harrell Gave not in the office)--requested call back

## 2022-09-04 NOTE — Telephone Encounter (Signed)
Patient is aware of 09/11/22 appointment time and provider change.

## 2022-09-11 ENCOUNTER — Ambulatory Visit (INDEPENDENT_AMBULATORY_CARE_PROVIDER_SITE_OTHER): Payer: Medicare Other | Admitting: Family

## 2022-09-11 ENCOUNTER — Encounter (HOSPITAL_BASED_OUTPATIENT_CLINIC_OR_DEPARTMENT_OTHER): Payer: Self-pay | Admitting: Family

## 2022-09-11 ENCOUNTER — Ambulatory Visit (HOSPITAL_BASED_OUTPATIENT_CLINIC_OR_DEPARTMENT_OTHER): Payer: Medicare Other | Admitting: Cardiology

## 2022-09-11 VITALS — BP 140/90 | HR 75 | Ht 77.0 in | Wt 293.0 lb

## 2022-09-11 DIAGNOSIS — D6859 Other primary thrombophilia: Secondary | ICD-10-CM

## 2022-09-11 DIAGNOSIS — I482 Chronic atrial fibrillation, unspecified: Secondary | ICD-10-CM | POA: Diagnosis not present

## 2022-09-11 DIAGNOSIS — R0602 Shortness of breath: Secondary | ICD-10-CM

## 2022-09-11 DIAGNOSIS — I10 Essential (primary) hypertension: Secondary | ICD-10-CM

## 2022-09-11 DIAGNOSIS — I5032 Chronic diastolic (congestive) heart failure: Secondary | ICD-10-CM

## 2022-09-11 DIAGNOSIS — I350 Nonrheumatic aortic (valve) stenosis: Secondary | ICD-10-CM

## 2022-09-11 DIAGNOSIS — I25118 Atherosclerotic heart disease of native coronary artery with other forms of angina pectoris: Secondary | ICD-10-CM

## 2022-09-11 DIAGNOSIS — Z952 Presence of prosthetic heart valve: Secondary | ICD-10-CM

## 2022-09-11 NOTE — Patient Instructions (Addendum)
Medication Instructions:  Your physician has recommended you make the following change in your medication:   INCREASE Furosemide to 2 tablets (80mg ) daily for 3 days  THEN return to Furosemide to 1 tablet (40mg )daily   *If you need a refill on your cardiac medications before your next appointment, please call your pharmacy*   Lab Work: Your physician recommends that you return for lab work today: CBC, BMP, BNP  If you have labs (blood work) drawn today and your tests are completely normal, you will receive your results only by: MyChart Message (if you have MyChart) OR A paper copy in the mail If you have any lab test that is abnormal or we need to change your treatment, we will call you to review the results.   Testing/Procedures: Your EKG today shows rate controlled atrial fibrillation.   Follow-Up: At [facility], you and your health needs are our priority.  As part of our continuing mission to provide you with exceptional heart care, we have created designated Provider Care Teams.  These Care Teams include your primary Cardiologist (physician) and Advanced Practice Providers (APPs -  Physician Assistants and Nurse Practitioners) who all work together to provide you with the care you need, when you need it.  We recommend signing up for the patient portal called "MyChart".  Sign up information is provided on this After Visit Summary.  MyChart is used to connect with patients for Virtual Visits (Telemedicine).  Patients are able to view lab/test results, encounter notes, upcoming appointments, etc.  Non-urgent messages can be sent to your provider as well.   To learn more about what you can do with MyChart, go to .    Your next appointment:   As scheduled  Other Instructions Heart Healthy Diet Recommendations: A low-salt diet is recommended. Meats should be grilled, baked, or boiled. Avoid fried foods. Focus on lean protein sources like fish or  chicken with vegetables and fruits. The American Heart Association is a [facility]!  American Heart Association Diet and Lifeystyle Recommendations   Exercise recommendations: The American Heart Association recommends 150 minutes of moderate intensity exercise weekly. Try 30 minutes of moderate intensity exercise 4-5 times per week. This could include walking, jogging, or swimming.   Important Information About Sugar

## 2022-09-11 NOTE — Progress Notes (Signed)
Office Visit    Patient Name: Louis Koch Date of Encounter: 09/11/2022  PCP:  Aviva KluverPcp, No   [facility]  Cardiologist:  Jodelle RedBridgette Christopher, MD  Advanced Practice Provider:  No care team member to display Electrophysiologist:  None      Chief Complaint    Louis Koch is a 88 y.o. male presents today for shortness of breath.    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 Clifton JamesMcAlhany & Dr. Laneta SimmersBartle   Severe aortic stenosis    Past Surgical History:  Procedure Laterality Date   INTRAOPERATIVE TRANSTHORACIC ECHOCARDIOGRAM N/A 11/15/2021   Procedure: INTRAOPERATIVE TRANSTHORACIC ECHOCARDIOGRAM;  Surgeon: Kathleene HazelMcAlhany, Christopher D, MD;  Location: [facility] INVASIVE CV LAB;  Service: Open Heart Surgery;  Laterality: N/A;   KNEE SURGERY     RIGHT/LEFT HEART CATH AND CORONARY ANGIOGRAPHY N/A 11/04/2021   Procedure: RIGHT/LEFT HEART CATH AND CORONARY ANGIOGRAPHY;  Surgeon: Tonny Bollmanooper, Michael, MD;  [facility];  Service: Cardiovascular;  Laterality: N/A;   TRANSCATHETER AORTIC VALVE REPLACEMENT, TRANSFEMORAL Right 11/15/2021   Procedure: TRANSCATHETER AORTIC VALVE REPLACEMENT, TRANSFEMORAL;  Surgeon: Kathleene HazelMcAlhany, Christopher D, MD;  Location: [facility] INVASIVE CV LAB;  Service: Open Heart Surgery;  Laterality: Right;   VASCULAR SURGERY      Allergies  No Known Allergies  History of Present Illness    Louis Koch is a 88 y.o. 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  Inadvertently found to be in new  onset atrial fibrillation during preoperative evaluation for cataract surgery. He declined OAC and later self discontinued Metoprolol.  Was doing well until 09/2021 when noted worsened dyspnea, orthopnea, PND. Admitted for acute CHF and diagnosed with severe LFLG AS. Echo 11/02/21 severe paradoxical low flow, low gradient aortic stenosis. R/LHC with mild to moderate nonobstructive coronary artery disease. He was started on Eliquis. Underwent successful TAVR with 26mm Edwards Sapien 3 Ultra Resilia THV via left TF approach 11/15/21. During procedure due to valve being initially caut in right external iliac artery Dr. Sherral Hammersobbins with [facility] called in and underwent right ileofemoral endarterectomy with bovine pericardial patch. Postop echo LVEF 40-45%, normal functioning TAVR with mean gradient 7.285mmHg and no PVL.   Last seen 12/14/21 with improvement in symptoms. Lasix 40mg  daily continued and based on labs Kdur reduced to 10mEq daily.   Weight up 13 pounds since clinic visit 11/2021. He weight himself at home which has been stable at home but gone up a bit. He does note some dietary indiscretion. He notes shortness and dyspnea on exertion more noticeable over last 2 weeks per his significant other. Does note he has a pinched nerve which has been interrupting his sleep. No orthopnea, PND, chest pain. For breakfast eats oatmeal and notes often fast food or fries for lunch and/or dinner.   EKGs/Labs/Other Studies Reviewed:   The following studies were reviewed today:  TAVR OPERATIVE NOTE     Date of Procedure:  11/15/2021   Preoperative Diagnosis:      Severe Aortic Stenosis    Postoperative Diagnosis:    Same    Procedure:        Transcatheter Aortic Valve Replacement - Percutaneous Left Transfemoral Approach             Edwards Sapien 3 Ultra ResiliaTHV (size 26 mm, model # 9755RSL, serial # [id number])              Co-Surgeons:                        Alleen Borne, MD and   Verne Carrow, MD     Anesthesiologist:                  Jerrye Noble, MD   Echocardiographer:              Lacretia Nicks. O'Neal, MD   Pre-operative Echo Findings: Severe aortic stenosis Mild left ventricular systolic dysfunction   Post-operative Echo Findings: Trivial paravalvular leak Unchanged mild left ventricular systolic dysfunction _____________     Echo 11/16/21:  IMPRESSIONS   1. 26 mm S3 TAVR. Vmax 1.7 m/s, MG 7.5 mmHG, EOA 2.35 cm2, DI 0.53.  Paravalvular leak not visualized on this study. Mild PVL noted on day of  procedure and not seen on this study. Normal appearing valve with normal  gradients. The aortic valve has been  repaired/replaced. Aortic valve regurgitation is not visualized. There is  a 26 mm Sapien prosthetic (TAVR) valve present in the aortic position.  Procedure Date: 11/15/2021. Echo findings are consistent with normal  structure and function of the aortic  valve prosthesis.   2. Left ventricular ejection fraction, by estimation, is 40 to 45%. The  left ventricle has mildly decreased function. The left ventricle  demonstrates global hypokinesis. Left ventricular diastolic function could  not be evaluated.   3. Right ventricular systolic function is normal. The right ventricular  size is normal. Tricuspid regurgitation signal is inadequate for assessing  PA pressure.   4. The mitral valve is degenerative. Trivial mitral valve regurgitation.  No evidence of mitral stenosis.   5. The inferior vena cava is normal in size with greater than 50%  respiratory variability, suggesting right atrial pressure of 3 mmHg.    ________________________   Echo 12/14/21 IMPRESSIONS   1. Left ventricular ejection fraction, by estimation, is 55 to 60%. The  left ventricle has normal function. The left ventricle has no regional  wall motion abnormalities. There is mild left ventricular hypertrophy.  Left ventricular diastolic parameters  are indeterminate.   2. Right ventricular  systolic function is normal. The right ventricular  size is normal.   3. Left atrial size was mildly dilated.   4. Mild mitral valve regurgitation.   5. S/p TAVR (26 mm Edwards S3Ultra Resilia; 11/15/21). Vmax 1.42 m/sec.  Peak and mean gradients 8 and 4 mm Hg respectively Dimensionless index is  0.59. Mild perivalvular regurgitation. Compared to previous perivalvular  regurgitation is slightly more  prominent. . The aortic valve has been repaired/replaced. Aortic valve  regurgitation is mild.   6. There is mild dilatation of the ascending aorta, measuring 43 mm.   7. The inferior vena cava is normal in size with greater than 50%  respiratory variability, suggesting right atrial pressure of 3 mmHg.    EKG:  EKG is ordered today.  EKG today demonstrates rate controlled atrial fibrillation 75 bpm with no  acute ST/T wave changes.   Recent Labs: 11/14/2021: ALT 23; B Natriuretic Peptide 114.4 11/16/2021: Hemoglobin 11.5; Magnesium 2.0; Platelets 174 12/14/2021: BUN 21; Creatinine, Ser 0.88; Potassium 4.8; Sodium 142  Recent Lipid Panel No results found for: "CHOL", "TRIG", "HDL", "CHOLHDL", "VLDL", "LDLCALC", "LDLDIRECT"  Risk Assessment/Calculations:   CHA2DS2-VASc Score = 3   This indicates a 3.2% annual risk of stroke. The patient's score is based upon: CHF History: 1 HTN History: 0 Diabetes History: 0 Stroke History: 0 Vascular Disease History: 0 Age Score: 2 Gender Score: 0     Home Medications   Current Meds  Medication Sig   amoxicillin (AMOXIL) 500 MG tablet Take 4 tablets by mouth 1 hour prior to dental procedures and cleanings   apixaban (ELIQUIS) 5 MG TABS tablet Take 1 tablet (5 mg total) by mouth 2 (two) times daily.   Cholecalciferol (D3 PO) Take 1 capsule by mouth daily.   Cyanocobalamin (B-12 PO) Take 1 tablet by mouth daily.   Ferrous Sulfate (IRON) 28 MG TABS Take 1 tablet by mouth daily.   furosemide (LASIX) 40 MG tablet TAKE 1 TABLET BY MOUTH EVERY DAY    metoprolol tartrate (LOPRESSOR) 25 MG tablet TAKE 1/2 TABLET(12.5 MG) BY MOUTH TWICE DAILY   Multiple Vitamin (MULTIVITAMIN WITH MINERALS) TABS tablet Take 1 tablet by mouth 3 (three) times a week.   potassium chloride SA (KLOR-CON M) 10 MEQ tablet Take 1 tablet (10 mEq total) by mouth daily.   Zinc 30 MG TABS Take 30 mg by mouth daily.     Review of Systems      All other systems reviewed and are otherwise negative except as noted above.  Physical Exam    VS:  BP (!) 140/90   Pulse 75   Ht 6\' 5"  (1.956 m)   Wt 293 lb (132.9 kg)   BMI 34.74 kg/m  , BMI Body mass index is 34.74 kg/m.  Wt Readings from Last 3 Encounters:  09/11/22 293 lb (132.9 kg)  12/14/21 280 lb 6.4 oz (127.2 kg)  12/02/21 281 lb (127.5 kg)    GEN: Well nourished, overweight, well developed, in no acute distress. HEENT: normal. Neck: Supple, no JVD, carotid bruits, or masses. Cardiac: RRR, no murmurs, rubs, or gallops. No clubbing, cyanosis, edema.  Radials/PT 2+ and equal bilaterally.  Respiratory:  Respirations regular and unlabored, clear to auscultation bilaterally. GI: Soft, nontender, nondistended. MS: No deformity or atrophy. Skin: Warm and dry, no rash. Neuro:  Strength and sensation are intact. Psych: Normal affect.  Assessment & Plan   Severe AS s/p TAVR - No murmur on exam. No PVL by echo 11/2021. Has repeat echo and f/u with structural heart team 11/29/21.   CAD -nonobstructive by LHC. Stable with no anginal symptoms. No indication for ischemic evaluation.  GDMT Metoprolol. Defer ASA due to [facility]. Defer statin due to age.   Dyspnea / Chronic diastolic heart failure -2-week history of shortness of breath and weight gain.  Likely volume up.  Increase Lasix to 80 mg daily for 3 days then return to 40 mg daily.  Update CBC, BMP, BNP today.  Does note dietary indiscretion-fluid restriction to less than 2 L and low-sodium, heart healthy diet encouraged. Low sodium diet, fluid restriction <2L, and daily  weights encouraged. Educated to contact our office for weight gain of 2 lbs overnight or 5 lbs in one week.   HTN - BP mildly elevated today in setting of volume overload.  Increase Lasix, as above.  Discussed to monitor BP at home at least 2 hours after medications and sitting for 5-10 minutes.  If BP remains elevated at follow-up could consider addition of amlodipine.  Chronic atrial fibrillation / Hypercoagulable state -rate controlled today.  Asymptomatic in regards to his atrial fibrillation.  Continue metoprolol tartrate 12.5 mg twice daily and Eliquis 5 mg twice daily.  Reiterated the need for anticoagulation he is agreeable to continue.  Update CBC, BMP today.      Disposition: Follow up  as scheduled  with Jodelle Red, MD or APP.  Signed, Alver Sorrow, NP 09/11/2022, 3:19 PM [facility]

## 2022-09-13 LAB — CBC
Hematocrit: 35.5 % — ABNORMAL LOW (ref 37.5–51.0)
Hemoglobin: 12.5 g/dL — ABNORMAL LOW (ref 13.0–17.7)
MCH: 32.9 pg (ref 26.6–33.0)
MCHC: 35.2 g/dL (ref 31.5–35.7)
MCV: 93 fL (ref 79–97)
Platelets: 203 10*3/uL (ref 150–450)
RBC: 3.8 x10E6/uL — ABNORMAL LOW (ref 4.14–5.80)
RDW: 13.4 % (ref 11.6–15.4)
WBC: 5.3 10*3/uL (ref 3.4–10.8)

## 2022-09-13 LAB — BASIC METABOLIC PANEL
BUN/Creatinine Ratio: 20 (ref 10–24)
BUN: 19 mg/dL (ref 10–36)
CO2: 23 mmol/L (ref 20–29)
Calcium: 9.2 mg/dL (ref 8.6–10.2)
Chloride: 101 mmol/L (ref 96–106)
Creatinine, Ser: 0.95 mg/dL (ref 0.76–1.27)
Glucose: 90 mg/dL (ref 70–99)
Potassium: 4.3 mmol/L (ref 3.5–5.2)
Sodium: 140 mmol/L (ref 134–144)
eGFR: 75 mL/min/{1.73_m2} (ref >59)

## 2022-09-13 LAB — BRAIN NATRIURETIC PEPTIDE: BNP: 52.2 pg/mL (ref 0.0–100.0)

## 2022-09-30 ENCOUNTER — Other Ambulatory Visit (HOSPITAL_BASED_OUTPATIENT_CLINIC_OR_DEPARTMENT_OTHER): Payer: Self-pay | Admitting: Cardiology

## 2022-09-30 DIAGNOSIS — I482 Chronic atrial fibrillation, unspecified: Secondary | ICD-10-CM

## 2022-10-02 NOTE — Telephone Encounter (Signed)
Prescription refill request for Eliquis received. Indication: Afib  Last office visit: 09/11/22 Dan Humphreys)  Scr: 0.95 (09/11/22)  Age: 88 Weight: 132.9kg  Appropriate dose and refill sent to requested pharmacy.

## 2022-10-02 NOTE — Telephone Encounter (Signed)
Please review for refill. Thank you! 

## 2022-11-08 DIAGNOSIS — Z23 Encounter for immunization: Secondary | ICD-10-CM | POA: Diagnosis not present

## 2022-11-08 DIAGNOSIS — I10 Essential (primary) hypertension: Secondary | ICD-10-CM | POA: Diagnosis not present

## 2022-11-08 DIAGNOSIS — I5032 Chronic diastolic (congestive) heart failure: Secondary | ICD-10-CM | POA: Diagnosis not present

## 2022-11-08 DIAGNOSIS — E785 Hyperlipidemia, unspecified: Secondary | ICD-10-CM | POA: Diagnosis not present

## 2022-11-08 DIAGNOSIS — Z0000 Encounter for general adult medical examination without abnormal findings: Secondary | ICD-10-CM | POA: Diagnosis not present

## 2022-11-08 DIAGNOSIS — D649 Anemia, unspecified: Secondary | ICD-10-CM | POA: Diagnosis not present

## 2022-11-08 DIAGNOSIS — Z952 Presence of prosthetic heart valve: Secondary | ICD-10-CM | POA: Diagnosis not present

## 2022-11-20 ENCOUNTER — Ambulatory Visit: Payer: Medicare Other | Admitting: Podiatry

## 2022-11-26 ENCOUNTER — Other Ambulatory Visit: Payer: Self-pay | Admitting: Cardiology

## 2022-11-27 NOTE — Telephone Encounter (Signed)
Rx(s) sent to pharmacy electronically.  

## 2022-11-28 NOTE — Progress Notes (Unsigned)
[facility] [place]                                     Cardiology Office Note:    Date:  11/30/2022   ID:  Louis Koch, DOB [birth year], MRN [id number]  PCP:  Pcp, No  [facility] Cardiologist:  Buford Dresser, MD / Dr. Angelena Form & Dr. Cyndia Bent (TAVR) [place] Electrophysiologist:  None   Referring MD: No ref. provider found   1 year s/p TAVR  History of Present Illness:    Louis Koch is a 89 y.o. 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  Inadvertently found to be in new onset atrial fibrillation during preoperative evaluation for cataract surgery. He declined [place] and later self discontinued Metoprolol.   Was doing well until 09/2021 when noted worsened dyspnea, orthopnea, PND. Admitted for acute CHF and diagnosed with severe LFLG AS. Echo 11/02/21 severe paradoxical low flow, low gradient aortic stenosis. R/LHC with mild to moderate nonobstructive coronary artery disease. He was started on Eliquis. Underwent successful TAVR with 27mm Edwards Sapien 3 Ultra Resilia THV via left TF approach 11/15/21. During procedure due to valve being initially caut in right external iliac artery Dr. Unk Lightning with [facility] called in and underwent right ileofemoral endarterectomy with bovine pericardial patch. Postop echo LVEF 40-45%, normal functioning TAVR with mean gradient 7.72mmHg and no PVL. He was discharged on Eliquis. He has done great in follow up with a big symptomatic improvement. 1 month echo showed EF 55-60%, normally functioning TAVR with a mean gradient of 4.3 mm hg and mild PVL.  He was seen in the office on 09/11/22 for weight gain and DOE. Lasix increased x3 days. BNP was normal and all other labs stable.   Today the patient presents to clinic for follow up. Her with friend. No CP  or SOB. No LE edema, orthopnea or PND. No dizziness or syncope. No blood in stool or urine. No palpitations. Doing great.    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 Angelena Form & Dr. Cyndia Bent   Severe aortic stenosis     Past Surgical History:  Procedure Laterality Date   INTRAOPERATIVE TRANSTHORACIC ECHOCARDIOGRAM N/A 11/15/2021   Procedure: INTRAOPERATIVE TRANSTHORACIC ECHOCARDIOGRAM;  Surgeon: Burnell Blanks, MD;  Location: [place] [facility];  Service: Open Heart Surgery;  Laterality: N/A;   KNEE SURGERY     RIGHT/LEFT HEART CATH AND CORONARY ANGIOGRAPHY N/A 11/04/2021   Procedure: RIGHT/LEFT HEART CATH AND CORONARY ANGIOGRAPHY;  Surgeon: Sherren Mocha, MD;  Location: [place] [facility];  Service: Cardiovascular;  Laterality: N/A;   TRANSCATHETER AORTIC VALVE REPLACEMENT, TRANSFEMORAL Right 11/15/2021   Procedure: TRANSCATHETER AORTIC VALVE REPLACEMENT, TRANSFEMORAL;  Surgeon: Burnell Blanks, MD;  Location: [place] [facility];  Service: Open Heart Surgery;  Laterality: Right;   VASCULAR SURGERY      Current Medications: Current Meds  Medication Sig   amoxicillin (AMOXIL) 500 MG tablet Take  4 tablets by mouth 1 hour prior to dental procedures and cleanings   Cholecalciferol (D3 PO) Take 1 capsule by mouth daily.   Cyanocobalamin (B-12 PO) Take 1 tablet by mouth daily.   ELIQUIS 5 MG TABS tablet TAKE 1 TABLET(5 MG) BY MOUTH TWICE DAILY   Ferrous Sulfate (IRON) 28 MG TABS Take 1 tablet by mouth daily.   furosemide (LASIX) 40 MG tablet TAKE 1 TABLET BY MOUTH EVERY DAY   metoprolol tartrate (LOPRESSOR) 25 MG tablet TAKE 1/2 TABLET(12.5 MG) BY MOUTH TWICE DAILY   Multiple Vitamin  (MULTIVITAMIN WITH MINERALS) TABS tablet Take 1 tablet by mouth 3 (three) times a week.   potassium chloride SA (KLOR-CON M) 10 MEQ tablet Take 1 tablet (10 mEq total) by mouth daily.   Zinc 30 MG TABS Take 30 mg by mouth daily.     Allergies:   Patient has no known allergies.   Social History   Socioeconomic History   Marital status: Unknown    Spouse name: Not on file   Number of children: Not on file   Years of education: Not on file   Highest education level: Not on file  Occupational History   Not on file  Tobacco Use   Smoking status: Never   Smokeless tobacco: Not on file  Vaping Use   Vaping Use: Never used  Substance and Sexual Activity   Alcohol use: Yes    Comment: occasionally   Drug use: No   Sexual activity: Not on file  Other Topics Concern   Not on file  Social History Narrative   Not on file   Social Determinants of Health   Financial Resource Strain: Not on file  Food Insecurity: Not on file  Transportation Needs: Not on file  Physical Activity: Not on file  Stress: Not on file  Social Connections: Not on file     Family History: The patient's family history includes Atrial fibrillation in his sister; Irregular heart beat in his sister.  ROS:   Please see the history of present illness.    All other systems reviewed and are negative.  EKGs/Labs/Other Studies Reviewed:    The following studies were reviewed today:  TAVR OPERATIVE NOTE     Date of Procedure:                11/15/2021   Preoperative Diagnosis:      Severe Aortic Stenosis    Postoperative Diagnosis:    Same    Procedure:        Transcatheter Aortic Valve Replacement - Percutaneous Left Transfemoral Approach             Edwards Sapien 3 Ultra ResiliaTHV (size 26 mm, model # 9755RSL, serial # [id number])              Co-Surgeons:                        Gaye Pollack, MD and   Lauree Chandler, MD     Anesthesiologist:                  Raechel Ache, MD    Echocardiographer:              Viona Gilmore. O'Neal, MD   Pre-operative Echo Findings: Severe aortic stenosis Mild left ventricular systolic dysfunction   Post-operative Echo Findings: Trivial paravalvular leak Unchanged mild left ventricular systolic dysfunction _____________     Echo 11/16/21:  IMPRESSIONS  1. 26 mm S3 TAVR. Vmax 1.7 m/s, MG 7.5 mmHG, EOA 2.35 cm2, DI 0.53.  Paravalvular leak not visualized on this study. Mild PVL noted on day of  procedure and not seen on this study. Normal appearing valve with normal  gradients. The aortic valve has been  repaired/replaced. Aortic valve regurgitation is not visualized. There is  a 26 mm Sapien prosthetic (TAVR) valve present in the aortic position.  Procedure Date: 11/15/2021. Echo findings are consistent with normal  structure and function of the aortic  valve prosthesis.   2. Left ventricular ejection fraction, by estimation, is 40 to 45%. The  left ventricle has mildly decreased function. The left ventricle  demonstrates global hypokinesis. Left ventricular diastolic function could  not be evaluated.   3. Right ventricular systolic function is normal. The right ventricular  size is normal. Tricuspid regurgitation signal is inadequate for assessing  PA pressure.   4. The mitral valve is degenerative. Trivial mitral valve regurgitation.  No evidence of mitral stenosis.   5. The inferior vena cava is normal in size with greater than 50%  respiratory variability, suggesting right atrial pressure of 3 mmHg.   ________________________  Echo 12/14/21 IMPRESSIONS   1. Left ventricular ejection fraction, by estimation, is 55 to 60%. The  left ventricle has normal function. The left ventricle has no regional  wall motion abnormalities. There is mild left ventricular hypertrophy.  Left ventricular diastolic parameters  are indeterminate.   2. Right ventricular systolic function is normal. The right ventricular  size is normal.   3.  Left atrial size was mildly dilated.   4. Mild mitral valve regurgitation.   5. S/p TAVR (26 mm Edwards S3Ultra Resilia; 11/15/21). Vmax 1.42 m/sec.  Peak and mean gradients 8 and 4 mm Hg respectively Dimensionless index is  0.59. Mild perivalvular regurgitation. Compared to previous perivalvular  regurgitation is slightly more  prominent. . The aortic valve has been repaired/replaced. Aortic valve  regurgitation is mild.   6. There is mild dilatation of the ascending aorta, measuring 43 mm.   7. The inferior vena cava is normal in size with greater than 50%  respiratory variability, suggesting right atrial pressure of 3 mmHg.  ______________________   Echo 11/29/22 IMPRESSIONS  1. Left ventricular ejection fraction, by estimation, is 60 to 65%. The  left ventricle has normal function. The left ventricle has no regional  wall motion abnormalities. There is mild left ventricular hypertrophy.  Left ventricular diastolic parameters  are indeterminate.   2. Right ventricular systolic function is normal. The right ventricular  size is mildly enlarged. There is mildly elevated pulmonary artery  systolic pressure.   3. The mitral valve is normal in structure. Mild mitral valve  regurgitation. No evidence of mitral stenosis.   4. The aortic valve has been repaired/replaced. Aortic valve  regurgitation is trivial. No aortic stenosis is present. There is a 26 mm  Sapien prosthetic (TAVR) valve present in the aortic position. Echo  findings are consistent with mild perivalvular  leak of the aortic prosthesis.   5. The inferior vena cava is normal in size with greater than 50%  respiratory variability, suggesting right atrial pressure of 3 mmHg.   6. Left atrial size was mildly dilated.   Comparison(s): No significant change from prior study. Prior images  reviewed side by side.   EKG:  EKG is NOT ordered today.   Recent Labs: 09/11/2022: BNP 52.2; BUN 19; Creatinine, Ser 0.95; Hemoglobin  12.5; Platelets 203;  Potassium 4.3; Sodium 140  Recent Lipid Panel No results found for: "CHOL", "TRIG", "HDL", "CHOLHDL", "VLDL", "LDLCALC", "LDLDIRECT"   Risk Assessment/Calculations:       Physical Exam:    VS:  BP 124/80 (BP Location: Left Arm, Patient Position: Sitting, Cuff Size: Large)   Pulse 99   Ht 6\' 5"  (1.956 m)   Wt 287 lb (130.2 kg)   SpO2 97%   BMI 34.03 kg/m     Wt Readings from Last 3 Encounters:  11/29/22 287 lb (130.2 kg)  09/11/22 293 lb (132.9 kg)  12/14/21 280 lb 6.4 oz (127.2 kg)     GEN:  Well nourished, well developed in no acute distress HEENT: Normal NECK: No JVD LYMPHATICS: No lymphadenopathy CARDIAC: irreg irreg. no murmurs, rubs, gallops RESPIRATORY:  Clear to auscultation without rales, wheezing or rhonchi  ABDOMEN: Soft, non-tender, non-distended MUSCULOSKELETAL:  No edema; No deformity  SKIN: Warm and dry.   NEUROLOGIC:  Alert and oriented x 3 PSYCHIATRIC:  Normal affect   ASSESSMENT:    1. S/P TAVR (transcatheter aortic valve replacement)   2. Chronic diastolic CHF (congestive heart failure) (HCC)   3. Essential hypertension   4. Chronic atrial fibrillation (HCC)    PLAN:    In order of problems listed above:  Severe AS s/p TAVR: echo today shows EF 60%, normally functioning TAVR with a mean gradient of 8 mm hg and mild PVL as well as mild MR. He is doing fantastic and has NYHA class I symptoms. Still works and goes into the office everyday. Continue Eliquis alone. SBE prophylaxis discussed; he has amoxicillin. Continue regular follow up with Dr. 12/16/21   Chronic diastolic CHF: appears euvolemic. Continue lasix 40mg  daily and Kdur 10 meq daily.   HTN: BP well controlled. No changes made.    Chronic afib: rate well controlled on Metoprolol. Continue on Eliquis.   Medication Adjustments/Labs and Tests Ordered: Current medicines are reviewed at length with the patient today.  Concerns regarding medicines are outlined above.   No orders of the defined types were placed in this encounter.  No orders of the defined types were placed in this encounter.   Patient Instructions  Medication Instructions:   Your physician recommends that you continue on your current medications as directed. Please refer to the Current Medication list given to you today.   *If you need a refill on your cardiac medications before your next appointment, please call your pharmacy*   Lab Work:  None ordered.  If you have labs (blood work) drawn today and your tests are completely normal, you will receive your results only by: MyChart Message (if you have MyChart) OR A paper copy in the mail If you have any lab test that is abnormal or we need to change your treatment, we will call you to review the results.   Testing/Procedures:  None ordered.   Follow-Up: At [facility], you and your health needs are our priority.  As part of our continuing mission to provide you with exceptional heart care, we have created designated Provider Care Teams.  These Care Teams include your primary Cardiologist (physician) and Advanced Practice Providers (APPs -  Physician Assistants and Nurse Practitioners) who all work together to provide you with the care you need, when you need it.  We recommend signing up for the patient portal called "MyChart".  Sign up information is provided on this After Visit Summary.  MyChart is used to connect with patients for Virtual Visits (  Telemedicine).  Patients are able to view lab/test results, encounter notes, upcoming appointments, etc.  Non-urgent messages can be sent to your provider as well.   To learn more about what you can do with MyChart, go to [email or web address].    Your next appointment:   8 month(s)  Provider:   Jodelle Red, MD        Signed, Cline Crock, PA-C  11/30/2022 10:05 AM    [facility]

## 2022-11-29 ENCOUNTER — Ambulatory Visit (HOSPITAL_COMMUNITY): Payer: Medicare Other | Attending: Cardiology

## 2022-11-29 ENCOUNTER — Ambulatory Visit: Payer: Medicare Other | Attending: Physician Assistant | Admitting: Physician Assistant

## 2022-11-29 VITALS — BP 124/80 | HR 99 | Ht 77.0 in | Wt 287.0 lb

## 2022-11-29 DIAGNOSIS — I10 Essential (primary) hypertension: Secondary | ICD-10-CM | POA: Diagnosis not present

## 2022-11-29 DIAGNOSIS — I5032 Chronic diastolic (congestive) heart failure: Secondary | ICD-10-CM

## 2022-11-29 DIAGNOSIS — I482 Chronic atrial fibrillation, unspecified: Secondary | ICD-10-CM | POA: Diagnosis not present

## 2022-11-29 DIAGNOSIS — Z952 Presence of prosthetic heart valve: Secondary | ICD-10-CM | POA: Insufficient documentation

## 2022-11-29 LAB — ECHOCARDIOGRAM COMPLETE
AR max vel: 1.49 cm2
AV Area VTI: 1.48 cm2
AV Area mean vel: 1.58 cm2
AV Mean grad: 8 mmHg
AV Peak grad: 15.5 mmHg
Ao pk vel: 1.97 m/s
Area-P 1/2: 2.86 cm2
P 1/2 time: 458 msec
S' Lateral: 3.1 cm

## 2022-11-29 NOTE — Patient Instructions (Signed)
Medication Instructions:   Your physician recommends that you continue on your current medications as directed. Please refer to the Current Medication list given to you today.   *If you need a refill on your cardiac medications before your next appointment, please call your pharmacy*   Lab Work:  None ordered.  If you have labs (blood work) drawn today and your tests are completely normal, you will receive your results only by: [place] (if you have MyChart) OR A paper copy in the mail If you have any lab test that is abnormal or we need to change your treatment, we will call you to review the results.   Testing/Procedures:  None ordered.   Follow-Up: At [facility], you and your health needs are our priority.  As part of our continuing mission to provide you with exceptional heart care, we have created designated Provider Care Teams.  These Care Teams include your primary Cardiologist (physician) and Advanced Practice Providers (APPs -  Physician Assistants and Nurse Practitioners) who all work together to provide you with the care you need, when you need it.  We recommend signing up for the patient portal called "MyChart".  Sign up information is provided on this After Visit Summary.  MyChart is used to connect with patients for Virtual Visits (Telemedicine).  Patients are able to view lab/test results, encounter notes, upcoming appointments, etc.  Non-urgent messages can be sent to your provider as well.   To learn more about what you can do with MyChart, go to [email or web address].    Your next appointment:   8 month(s)  Provider:   Buford Dresser, MD

## 2022-12-06 ENCOUNTER — Ambulatory Visit (INDEPENDENT_AMBULATORY_CARE_PROVIDER_SITE_OTHER): Payer: Medicare Other | Admitting: Podiatry

## 2022-12-06 DIAGNOSIS — M79675 Pain in left toe(s): Secondary | ICD-10-CM

## 2022-12-06 DIAGNOSIS — B351 Tinea unguium: Secondary | ICD-10-CM

## 2022-12-06 DIAGNOSIS — M79674 Pain in right toe(s): Secondary | ICD-10-CM | POA: Diagnosis not present

## 2022-12-06 NOTE — Progress Notes (Signed)
   Chief Complaint  Patient presents with   Nail Problem    Patient came in today, routine foot care, nail trim, corn on the right 2nd toe,     SUBJECTIVE Patient presents to office today complaining of elongated, thickened nails that cause pain while ambulating in shoes.  Patient is unable to trim their own nails. Patient is here for further evaluation and treatment.  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 Angelena Form & Dr. Cyndia Bent   Severe aortic stenosis     No Known Allergies   OBJECTIVE General Patient is awake, alert, and oriented x 3 and in no acute distress. Derm Skin is dry and supple bilateral. Negative open lesions or macerations. Remaining integument unremarkable. Nails are tender, long, thickened and dystrophic with subungual debris, consistent with onychomycosis, 1-5 bilateral. No signs of infection noted. Vasc  DP and PT pedal pulses palpable bilaterally. Temperature gradient within normal limits.  Neuro Epicritic and protective threshold sensation grossly intact bilaterally.  Musculoskeletal Exam hammertoe deformities noted to the lesser digits especially the right second digit.  Muscular strength within normal limits.  ASSESSMENT 1.  Pain due to onychomycosis of toenails both 2.  Hammertoe second digit right foot with overlying corn  PLAN OF CARE 1. Patient evaluated today.  2. Instructed to maintain good pedal hygiene and foot care.  3. Mechanical debridement of nails 1-5 bilaterally performed using a nail nipper. Filed with dremel without incident.  4.  Excisional debridement of the symptomatic corn to the PIPJ of the second digit right foot performed using a tissue nipper without  incident or bleeding.  Patient felt relief.  Recommend OTC corn pads  5.  Return to clinic in 3 mos. for routine footcare   Edrick Kins, DPM [facility]  Dr. Edrick Kins, DPM    [address], [place] [address]                Office [phone]  Fax [phone]

## 2023-01-03 ENCOUNTER — Emergency Department (HOSPITAL_COMMUNITY): Payer: Medicare Other

## 2023-01-03 ENCOUNTER — Other Ambulatory Visit: Payer: Self-pay

## 2023-01-03 ENCOUNTER — Emergency Department (HOSPITAL_COMMUNITY)
Admission: EM | Admit: 2023-01-03 | Discharge: 2023-01-03 | Disposition: A | Discharge status: Home / Self Care | Payer: Medicare Other | Attending: Emergency Medicine | Admitting: Emergency Medicine

## 2023-01-03 ENCOUNTER — Encounter (HOSPITAL_COMMUNITY): Payer: Self-pay

## 2023-01-03 DIAGNOSIS — Y92410 Unspecified street and highway as the place of occurrence of the external cause: Secondary | ICD-10-CM | POA: Diagnosis not present

## 2023-01-03 DIAGNOSIS — S60021A Contusion of right index finger without damage to nail, initial encounter: Secondary | ICD-10-CM | POA: Diagnosis not present

## 2023-01-03 DIAGNOSIS — Z7901 Long term (current) use of anticoagulants: Secondary | ICD-10-CM | POA: Insufficient documentation

## 2023-01-03 DIAGNOSIS — I700 Atherosclerosis of aorta: Secondary | ICD-10-CM | POA: Diagnosis not present

## 2023-01-03 DIAGNOSIS — S0990XA Unspecified injury of head, initial encounter: Secondary | ICD-10-CM | POA: Diagnosis not present

## 2023-01-03 DIAGNOSIS — N323 Diverticulum of bladder: Secondary | ICD-10-CM | POA: Insufficient documentation

## 2023-01-03 DIAGNOSIS — Z79899 Other long term (current) drug therapy: Secondary | ICD-10-CM | POA: Diagnosis not present

## 2023-01-03 DIAGNOSIS — S6991XA Unspecified injury of right wrist, hand and finger(s), initial encounter: Secondary | ICD-10-CM | POA: Diagnosis not present

## 2023-01-03 DIAGNOSIS — I10 Essential (primary) hypertension: Secondary | ICD-10-CM | POA: Diagnosis not present

## 2023-01-03 DIAGNOSIS — T1490XA Injury, unspecified, initial encounter: Secondary | ICD-10-CM | POA: Diagnosis not present

## 2023-01-03 DIAGNOSIS — I2510 Atherosclerotic heart disease of native coronary artery without angina pectoris: Secondary | ICD-10-CM | POA: Diagnosis not present

## 2023-01-03 DIAGNOSIS — K449 Diaphragmatic hernia without obstruction or gangrene: Secondary | ICD-10-CM | POA: Insufficient documentation

## 2023-01-03 DIAGNOSIS — N200 Calculus of kidney: Secondary | ICD-10-CM | POA: Diagnosis not present

## 2023-01-03 DIAGNOSIS — M79644 Pain in right finger(s): Secondary | ICD-10-CM | POA: Diagnosis not present

## 2023-01-03 DIAGNOSIS — S6990XA Unspecified injury of unspecified wrist, hand and finger(s), initial encounter: Secondary | ICD-10-CM | POA: Diagnosis not present

## 2023-01-03 DIAGNOSIS — J9811 Atelectasis: Secondary | ICD-10-CM | POA: Diagnosis not present

## 2023-01-03 LAB — CBC WITH DIFFERENTIAL/PLATELET
Abs Immature Granulocytes: 0.03 10*3/uL (ref 0.00–0.07)
Basophils Absolute: 0 10*3/uL (ref 0.0–0.1)
Basophils Relative: 1 %
Eosinophils Absolute: 0.3 10*3/uL (ref 0.0–0.5)
Eosinophils Relative: 5 %
HCT: 38.5 % — ABNORMAL LOW (ref 39.0–52.0)
Hemoglobin: 12.9 g/dL — ABNORMAL LOW (ref 13.0–17.0)
Immature Granulocytes: 1 %
Lymphocytes Relative: 16 %
Lymphs Abs: 0.9 10*3/uL (ref 0.7–4.0)
MCH: 32.7 pg (ref 26.0–34.0)
MCHC: 33.5 g/dL (ref 30.0–36.0)
MCV: 97.7 fL (ref 80.0–100.0)
Monocytes Absolute: 0.5 10*3/uL (ref 0.1–1.0)
Monocytes Relative: 9 %
Neutro Abs: 3.8 10*3/uL (ref 1.7–7.7)
Neutrophils Relative %: 68 %
Platelets: 185 10*3/uL (ref 150–400)
RBC: 3.94 MIL/uL — ABNORMAL LOW (ref 4.22–5.81)
RDW: 13.9 % (ref 11.5–15.5)
WBC: 5.5 10*3/uL (ref 4.0–10.5)
nRBC: 0 % (ref 0.0–0.2)

## 2023-01-03 LAB — BASIC METABOLIC PANEL
Anion gap: 10 (ref 5–15)
BUN: 18 mg/dL (ref 8–23)
CO2: 24 mmol/L (ref 22–32)
Calcium: 9 mg/dL (ref 8.9–10.3)
Chloride: 101 mmol/L (ref 98–111)
Creatinine, Ser: 0.81 mg/dL (ref 0.61–1.24)
GFR, Estimated: >60 mL/min (ref >60)
Glucose, Bld: 121 mg/dL — ABNORMAL HIGH (ref 70–99)
Potassium: 4.1 mmol/L (ref 3.5–5.1)
Sodium: 135 mmol/L (ref 135–145)

## 2023-01-03 MED ORDER — IOHEXOL 350 MG/ML SOLN
75.0000 mL | Freq: Once | INTRAVENOUS | Status: AC | PRN
  Administered 2023-01-03: 75 mL via INTRAVENOUS

## 2023-01-03 NOTE — ED Notes (Signed)
Small skin tear on pt left hand bandaged with xeroform and gauze.
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
## 2023-01-03 NOTE — ED Notes (Signed)
Pt partner brought food. RN informed partner to wait until pt has had his CT scan.
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
## 2023-01-03 NOTE — ED Notes (Signed)
RN notified CT pt has IV access.

## 2023-01-03 NOTE — ED Provider Notes (Signed)
Care patient was received from prior provider at 1500, please see their note for initial H&P. On reassessment, patient symptoms are resolved.  He has no further bleeding all abrasions have been bandaged and have hemostasis.  Blood pressure has improved spontaneously.  He is ambulatory tolerating p.o. intake in no acute distress.  CT is all reviewed without acute traumatic injury or remarkable incidental finding.  Discussed this with patient and wife at bedside they feel comfortable with discharge to outpatient care management.  Strict return precautions regarding delayed bleeding in the setting of anticoagulation reinforced and patient expressed understanding.  Patient discharged with no further acute events.   Tretha Sciara, MD 01/03/23 [phone]

## 2023-01-15 DIAGNOSIS — H00024 Hordeolum internum left upper eyelid: Secondary | ICD-10-CM | POA: Diagnosis not present

## 2023-03-21 ENCOUNTER — Ambulatory Visit: Payer: Medicare Other | Admitting: Podiatry

## 2023-04-05 ENCOUNTER — Other Ambulatory Visit (HOSPITAL_BASED_OUTPATIENT_CLINIC_OR_DEPARTMENT_OTHER): Payer: Self-pay | Admitting: Cardiology

## 2023-04-05 NOTE — Telephone Encounter (Signed)
Rx request sent to pharmacy.  

## 2023-05-24 ENCOUNTER — Encounter (HOSPITAL_BASED_OUTPATIENT_CLINIC_OR_DEPARTMENT_OTHER): Payer: Self-pay

## 2023-05-24 ENCOUNTER — Telehealth (HOSPITAL_BASED_OUTPATIENT_CLINIC_OR_DEPARTMENT_OTHER): Payer: Self-pay | Admitting: Cardiology

## 2023-05-24 NOTE — Telephone Encounter (Signed)
Left message for patient regarding schedule changes for Dr.  Callas appt 07/27/23 at 3:00pm  New appt Thursday 07/26/23 at 3:00 pm.  Will  mail information to patient and also send My Chart message

## 2023-07-24 ENCOUNTER — Ambulatory Visit (INDEPENDENT_AMBULATORY_CARE_PROVIDER_SITE_OTHER): Payer: Medicare Other | Admitting: Podiatry

## 2023-07-24 DIAGNOSIS — Z91199 Patient's noncompliance with other medical treatment and regimen due to unspecified reason: Secondary | ICD-10-CM

## 2023-07-24 NOTE — Progress Notes (Signed)
1. No-show for appointment     

## 2023-07-26 ENCOUNTER — Ambulatory Visit (HOSPITAL_BASED_OUTPATIENT_CLINIC_OR_DEPARTMENT_OTHER): Payer: Medicare Other | Admitting: Cardiology

## 2023-07-27 ENCOUNTER — Ambulatory Visit (HOSPITAL_BASED_OUTPATIENT_CLINIC_OR_DEPARTMENT_OTHER): Payer: Medicare Other | Admitting: Cardiology

## 2023-08-20 ENCOUNTER — Other Ambulatory Visit (HOSPITAL_BASED_OUTPATIENT_CLINIC_OR_DEPARTMENT_OTHER): Payer: Self-pay | Admitting: Cardiology

## 2023-08-20 DIAGNOSIS — I482 Chronic atrial fibrillation, unspecified: Secondary | ICD-10-CM

## 2023-08-21 NOTE — Telephone Encounter (Signed)
Eliquis please

## 2023-08-21 NOTE — Telephone Encounter (Signed)
Eliquis 5mg  refill request received. Patient is 89 years old, weight-129.3kg, Crea-0.81 on 01/03/23, Diagnosis-Afib, and last seen by Bary Castilla on 11/29/22. Dose is appropriate based on dosing criteria. Will send in refill to requested pharmacy.

## 2023-11-05 ENCOUNTER — Ambulatory Visit: Payer: Medicare Other

## 2023-11-05 ENCOUNTER — Emergency Department (HOSPITAL_BASED_OUTPATIENT_CLINIC_OR_DEPARTMENT_OTHER)
Admission: EM | Admit: 2023-11-05 | Discharge: 2023-11-05 | Discharge status: Against Medical Advice | Payer: Medicare Other | Source: Home / Self Care

## 2023-11-12 ENCOUNTER — Ambulatory Visit (INDEPENDENT_AMBULATORY_CARE_PROVIDER_SITE_OTHER): Payer: Medicare Other | Admitting: Cardiology

## 2023-11-12 ENCOUNTER — Encounter (HOSPITAL_BASED_OUTPATIENT_CLINIC_OR_DEPARTMENT_OTHER): Payer: Self-pay

## 2023-11-12 ENCOUNTER — Encounter (HOSPITAL_BASED_OUTPATIENT_CLINIC_OR_DEPARTMENT_OTHER): Payer: Self-pay | Admitting: Cardiology

## 2023-11-12 VITALS — BP 122/74 | HR 87 | Ht 75.0 in | Wt 282.0 lb

## 2023-11-12 DIAGNOSIS — Z952 Presence of prosthetic heart valve: Secondary | ICD-10-CM

## 2023-11-12 DIAGNOSIS — I10 Essential (primary) hypertension: Secondary | ICD-10-CM

## 2023-11-12 DIAGNOSIS — I4821 Permanent atrial fibrillation: Secondary | ICD-10-CM | POA: Diagnosis not present

## 2023-11-12 DIAGNOSIS — I5032 Chronic diastolic (congestive) heart failure: Secondary | ICD-10-CM | POA: Diagnosis not present

## 2023-11-12 DIAGNOSIS — I350 Nonrheumatic aortic (valve) stenosis: Secondary | ICD-10-CM

## 2023-11-12 DIAGNOSIS — I2510 Atherosclerotic heart disease of native coronary artery without angina pectoris: Secondary | ICD-10-CM

## 2023-11-12 NOTE — Progress Notes (Signed)
 Cardiology Office Note:  .   Date:  11/12/2023  ID:  Louis Koch, DOB February 02, 1927, MRN [id number] PCP: Pcp, No  [facility] Providers Cardiologist:  Shelda Bruckner, MD {  History of Present Illness: .   Louis Koch is a 89 y.o. 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 I met him during his hospitalization in 10/2021 for acute diastolic heart failure and severe aortic stenosis.  Pertinent CV history: Admitted 10/2021 for hypoxemic respiratory failure. Found to be in afib. New severe aortic stenosis (low flow low gradient) found. Underwent TAVR 11/15/21 with 26 mm Edwards Sapien 3. Required R iliofemoral endarterectomy with bovine patch after transfemoral attempt on the right. TAVR placed via L transfemoral access. LHC prior to TAVR with mild-moderate nonobstructive CAD.  Today: Seen by [facility] 08/2022 (SOB) and Lamarr Hummer 11/29/2022 (TAVR) most recently. Came to ER 11/05/23, left without being seen. Here with his wife today.   Brings records from urgent care visit from [facility] urgent care on 11/05/23. Wife had RSV, then patient developed symptoms around 12/27. Noted cough, shortness of breath. Has had difficulty sleeping with this until just last night. RSV positive by swab. CXR performed, read as no acute cardiopulmonary process. Given augmentin, azithromycin , prednisone, albuterol . Feels that he is significantly improved.  He feels that he is eating and drinking normally most of the time. Activity has been good.  His BP cuff notes intermittent afib, but he cannot feel any afib. ECG in afib today.  ROS: Denies chest pain, shortness of breath at rest or with normal exertion. No PND, orthopnea, LE edema or unexpected weight gain. No syncope or palpitations. ROS otherwise negative except as noted.   Studies Reviewed: SABRA    EKG:  EKG Interpretation Date/Time:  Monday November 12 2023 14:07:02 EST Ventricular Rate:  87 PR  Interval:    QRS Duration:  86 QT Interval:  356 QTC Calculation: 428 R Axis:   -11  Text Interpretation: Atrial fibrillation Minimal voltage criteria for LVH, may be normal variant Confirmed by Bruckner Shelda [phone]) on 11/12/2023 2:45:59 PM    Physical Exam:   VS:  BP 122/74   Pulse 87   Ht 6' 3 (1.905 m)   Wt 282 lb (127.9 kg)   SpO2 95%   BMI 35.25 kg/m    Wt Readings from Last 3 Encounters:  11/12/23 282 lb (127.9 kg)  01/03/23 285 lb (129.3 kg)  11/29/22 287 lb (130.2 kg)    GEN: Well nourished, well developed in no acute distress HEENT: Normal, moist mucous membranes NECK: No JVD CARDIAC: irregularly irregular rhythm, normal S1 and S2, no rubs or gallops. 1/6 systolic murmur. VASCULAR: Radial and DP pulses 2+ bilaterally. No carotid bruits RESPIRATORY:  Clear to auscultation without rales, wheezing or rhonchi  ABDOMEN: Soft, non-tender, non-distended MUSCULOSKELETAL:  Ambulates independently SKIN: Warm and dry, no edema NEUROLOGIC:  Alert and oriented x 3. No focal neuro deficits noted. PSYCHIATRIC:  Normal affect    ASSESSMENT AND PLAN: .    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 He declines at this time.  Atrial fibrillation, likely permanent -CHA2DS2/VAS Stroke Risk Points=5  -asymptomatic  -rate controlled -  on apixaban  permanently, no bleeding issues -felt poorly on metoprolol , rate controlled without this. Will contact me if HR rises.  CV risk counseling and prevention -recommend heart healthy/Mediterranean diet, with whole grains, fruits, vegetable, fish, lean meats, nuts, and olive oil. Limit  salt. -recommend moderate walking, 3-5 times/week for 30-50 minutes each session. Aim for at least 150 minutes.week. Goal should be pace of 3 miles/hours, or walking 1.5 miles in 30 minutes -recommend avoidance of tobacco products. Avoid excess alcohol.  Dispo: 6 mos or sooner as needed  Total time of encounter: I spent 50 minutes dedicated to the care of this patient on the date of this encounter to include pre-visit review of records, face-to-face time with the patient discussing conditions above, and clinical documentation with the electronic health record. We specifically spent time today discussing his symptoms, recent ER visit with RSV, findings from his TAVR, recommendations for statin given vascular disease, shared decision making.   Signed, Shelda Bruckner, MD   Shelda Bruckner, MD, PhD, [facility]  [facility]  [place]  [facility] at [facility] at [facility] [address], [address] [place], [place] [address] [phone]

## 2023-11-12 NOTE — Patient Instructions (Signed)

## 2023-12-06 ENCOUNTER — Telehealth: Payer: Self-pay | Admitting: Cardiology

## 2023-12-06 ENCOUNTER — Encounter (HOSPITAL_BASED_OUTPATIENT_CLINIC_OR_DEPARTMENT_OTHER): Payer: Self-pay

## 2023-12-06 DIAGNOSIS — I482 Chronic atrial fibrillation, unspecified: Secondary | ICD-10-CM

## 2023-12-06 MED ORDER — APIXABAN 5 MG PO TABS: 5.0000 mg | ORAL_TABLET | Freq: Two times a day (BID) | ORAL | 5 refills | Status: DC

## 2023-12-06 NOTE — Telephone Encounter (Signed)
Refill sent to [facility] for pt.

## 2023-12-06 NOTE — Telephone Encounter (Signed)
 Pt c/o medication issue:  1. Name of Medication:   apixaban  (ELIQUIS ) 5 MG TABS tablet    2. How are you currently taking this medication (dosage and times per day)? As written  3. Are you having a reaction (difficulty breathing--STAT)? No   4. What is your medication issue? They have misplaced medication and think they may have thrown it away. They know if we call in another script they will have to pay out of pocket. Pt has not medication.

## 2024-02-11 ENCOUNTER — Other Ambulatory Visit (HOSPITAL_BASED_OUTPATIENT_CLINIC_OR_DEPARTMENT_OTHER): Payer: Self-pay

## 2024-02-11 MED ORDER — FUROSEMIDE 40 MG PO TABS: 40.0000 mg | ORAL_TABLET | Freq: Every day | ORAL | 1 refills | Status: DC

## 2024-07-08 ENCOUNTER — Encounter: Payer: Self-pay | Admitting: Gastroenterology

## 2024-08-28 ENCOUNTER — Ambulatory Visit: Admitting: Gastroenterology

## 2024-09-03 ENCOUNTER — Other Ambulatory Visit (HOSPITAL_BASED_OUTPATIENT_CLINIC_OR_DEPARTMENT_OTHER): Payer: Self-pay | Admitting: Cardiology

## 2024-09-07 ENCOUNTER — Other Ambulatory Visit (HOSPITAL_BASED_OUTPATIENT_CLINIC_OR_DEPARTMENT_OTHER): Payer: Self-pay | Admitting: Cardiology

## 2024-09-07 DIAGNOSIS — I482 Chronic atrial fibrillation, unspecified: Secondary | ICD-10-CM

## 2024-09-08 NOTE — Telephone Encounter (Signed)
 Prescription refill request for Eliquis  received. Indication: AF Last office visit: 11/12/23  KATHEE Bruckner MD Scr: 0.81 on 01/03/23  Epic Age: 89 Weight: 127.9kg  Based on above findings Eliquis  5mg  twice daily is the appropriate dose.  Pt is past due to see Dr Bruckner and labs are due.  Message sent to schedulers to get appt made. Refill approved x 2
